# Patient Record
Sex: Female | Born: 1945 | Race: White | Hispanic: No | Marital: Married | Smoking: Never smoker
Health system: Southern US, Community
[De-identification: ages and names within clinical notes are randomized; demographics above are authoritative.]

## PROBLEM LIST (undated history)

## (undated) DIAGNOSIS — M1990 Unspecified osteoarthritis, unspecified site: Secondary | ICD-10-CM

## (undated) DIAGNOSIS — Z780 Asymptomatic menopausal state: Secondary | ICD-10-CM

## (undated) DIAGNOSIS — Z86010 Personal history of colonic polyps: Secondary | ICD-10-CM

## (undated) DIAGNOSIS — E0590 Thyrotoxicosis, unspecified without thyrotoxic crisis or storm: Secondary | ICD-10-CM

## (undated) DIAGNOSIS — K589 Irritable bowel syndrome without diarrhea: Secondary | ICD-10-CM

## (undated) DIAGNOSIS — K5732 Diverticulitis of large intestine without perforation or abscess without bleeding: Secondary | ICD-10-CM

## (undated) DIAGNOSIS — E119 Type 2 diabetes mellitus without complications: Secondary | ICD-10-CM

## (undated) DIAGNOSIS — T7840XA Allergy, unspecified, initial encounter: Secondary | ICD-10-CM

## (undated) DIAGNOSIS — E7800 Pure hypercholesterolemia, unspecified: Secondary | ICD-10-CM

## (undated) DIAGNOSIS — E039 Hypothyroidism, unspecified: Secondary | ICD-10-CM

## (undated) DIAGNOSIS — K5730 Diverticulosis of large intestine without perforation or abscess without bleeding: Secondary | ICD-10-CM

## (undated) HISTORY — PX: BREAST SURGERY: SHX581

## (undated) HISTORY — DX: Unspecified osteoarthritis, unspecified site: M19.90

## (undated) HISTORY — DX: Type 2 diabetes mellitus without complications: E11.9

## (undated) HISTORY — PX: ABDOMINAL HYSTERECTOMY: SHX81

## (undated) HISTORY — DX: Diverticulitis of large intestine without perforation or abscess without bleeding: K57.32

## (undated) HISTORY — DX: Irritable bowel syndrome, unspecified: K58.9

## (undated) HISTORY — DX: Pure hypercholesterolemia, unspecified: E78.00

## (undated) HISTORY — DX: Allergy, unspecified, initial encounter: T78.40XA

## (undated) HISTORY — PX: COLONOSCOPY: SHX174

## (undated) HISTORY — DX: Hypothyroidism, unspecified: E03.9

## (undated) HISTORY — PX: BRAIN MENINGIOMA EXCISION: SHX576

## (undated) HISTORY — DX: Personal history of colonic polyps: Z86.010

## (undated) HISTORY — DX: Thyrotoxicosis, unspecified without thyrotoxic crisis or storm: E05.90

## (undated) HISTORY — PX: TUBAL LIGATION: SHX77

## (undated) HISTORY — DX: Diverticulosis of large intestine without perforation or abscess without bleeding: K57.30

## (undated) HISTORY — DX: Asymptomatic menopausal state: Z78.0

---

## 1994-10-23 HISTORY — PX: VSD REPAIR: SHX276

## 1999-07-04 ENCOUNTER — Encounter: Payer: Self-pay | Admitting: Family Medicine

## 1999-07-04 ENCOUNTER — Ambulatory Visit (HOSPITAL_COMMUNITY): Admission: RE | Admit: 1999-07-04 | Discharge: 1999-07-04 | Payer: Self-pay | Admitting: Family Medicine

## 2001-08-01 ENCOUNTER — Ambulatory Visit (HOSPITAL_COMMUNITY): Admission: RE | Admit: 2001-08-01 | Discharge: 2001-08-01 | Payer: Self-pay | Admitting: Family Medicine

## 2001-08-01 ENCOUNTER — Encounter: Payer: Self-pay | Admitting: Family Medicine

## 2002-09-01 ENCOUNTER — Ambulatory Visit (HOSPITAL_COMMUNITY): Admission: RE | Admit: 2002-09-01 | Discharge: 2002-09-01 | Payer: Self-pay | Admitting: Family Medicine

## 2002-09-01 ENCOUNTER — Encounter: Payer: Self-pay | Admitting: Family Medicine

## 2003-09-30 ENCOUNTER — Encounter: Payer: Self-pay | Admitting: Internal Medicine

## 2004-03-30 ENCOUNTER — Emergency Department (HOSPITAL_COMMUNITY): Admission: EM | Admit: 2004-03-30 | Discharge: 2004-03-30 | Payer: Self-pay | Admitting: Family Medicine

## 2004-05-20 ENCOUNTER — Emergency Department (HOSPITAL_COMMUNITY): Admission: EM | Admit: 2004-05-20 | Discharge: 2004-05-20 | Payer: Self-pay | Admitting: Family Medicine

## 2004-08-31 ENCOUNTER — Ambulatory Visit: Payer: Self-pay | Admitting: Endocrinology

## 2005-03-15 ENCOUNTER — Ambulatory Visit (HOSPITAL_COMMUNITY): Admission: RE | Admit: 2005-03-15 | Discharge: 2005-03-15 | Payer: Self-pay | Admitting: Endocrinology

## 2005-03-31 ENCOUNTER — Ambulatory Visit: Payer: Self-pay | Admitting: Endocrinology

## 2005-04-05 ENCOUNTER — Ambulatory Visit: Payer: Self-pay | Admitting: Endocrinology

## 2006-02-28 ENCOUNTER — Ambulatory Visit: Payer: Self-pay | Admitting: Endocrinology

## 2006-02-28 ENCOUNTER — Ambulatory Visit (HOSPITAL_COMMUNITY): Admission: RE | Admit: 2006-02-28 | Discharge: 2006-02-28 | Payer: Self-pay | Admitting: Emergency Medicine

## 2006-03-28 ENCOUNTER — Ambulatory Visit: Payer: Self-pay | Admitting: Endocrinology

## 2006-04-04 ENCOUNTER — Ambulatory Visit: Payer: Self-pay | Admitting: Endocrinology

## 2006-05-03 ENCOUNTER — Ambulatory Visit: Payer: Self-pay | Admitting: Endocrinology

## 2006-05-09 ENCOUNTER — Ambulatory Visit: Payer: Self-pay | Admitting: Endocrinology

## 2006-06-01 ENCOUNTER — Emergency Department (HOSPITAL_COMMUNITY): Admission: EM | Admit: 2006-06-01 | Discharge: 2006-06-01 | Payer: Self-pay | Admitting: Family Medicine

## 2006-06-05 ENCOUNTER — Emergency Department (HOSPITAL_COMMUNITY): Admission: EM | Admit: 2006-06-05 | Discharge: 2006-06-05 | Payer: Self-pay | Admitting: Emergency Medicine

## 2006-07-18 ENCOUNTER — Ambulatory Visit: Payer: Self-pay | Admitting: Endocrinology

## 2006-11-14 ENCOUNTER — Ambulatory Visit: Payer: Self-pay | Admitting: Endocrinology

## 2007-01-09 ENCOUNTER — Ambulatory Visit: Payer: Self-pay | Admitting: Endocrinology

## 2007-01-09 LAB — CONVERTED CEMR LAB
Eosinophils Relative: 1.5 % (ref 0.0–5.0)
Lymphocytes Relative: 19.4 % (ref 12.0–46.0)
MCHC: 34.4 g/dL (ref 30.0–36.0)
Monocytes Absolute: 0.6 10*3/uL (ref 0.2–0.7)
Neutrophils Relative %: 72.1 % (ref 43.0–77.0)
Platelets: 243 10*3/uL (ref 150–400)
RBC: 4.55 M/uL (ref 3.87–5.11)
RDW: 13.1 % (ref 11.5–14.6)

## 2007-01-23 ENCOUNTER — Encounter: Admission: RE | Admit: 2007-01-23 | Discharge: 2007-01-23 | Payer: Self-pay | Admitting: Endocrinology

## 2007-02-20 ENCOUNTER — Encounter: Admission: RE | Admit: 2007-02-20 | Discharge: 2007-02-20 | Payer: Self-pay | Admitting: Endocrinology

## 2007-03-29 ENCOUNTER — Ambulatory Visit: Payer: Self-pay | Admitting: Endocrinology

## 2007-04-01 ENCOUNTER — Ambulatory Visit: Payer: Self-pay | Admitting: Endocrinology

## 2007-04-08 ENCOUNTER — Ambulatory Visit: Payer: Self-pay | Admitting: Endocrinology

## 2007-04-08 LAB — CONVERTED CEMR LAB
ALT: 31 units/L (ref 0–40)
AST: 17 units/L (ref 0–37)
Albumin: 3.2 g/dL — ABNORMAL LOW (ref 3.5–5.2)
Alkaline Phosphatase: 64 units/L (ref 39–117)
BUN: 10 mg/dL (ref 6–23)
Bacteria, UA: NEGATIVE
Basophils Relative: 1.3 % — ABNORMAL HIGH (ref 0.0–1.0)
Bilirubin, Direct: 0.1 mg/dL (ref 0.0–0.3)
Eosinophils Absolute: 0.4 10*3/uL (ref 0.0–0.6)
Eosinophils Relative: 4.1 % (ref 0.0–5.0)
GFR calc Af Amer: 131 mL/min
Glucose, Bld: 89 mg/dL (ref 70–99)
HCT: 40.5 % (ref 36.0–46.0)
Hemoglobin: 13.9 g/dL (ref 12.0–15.0)
MCHC: 34.3 g/dL (ref 30.0–36.0)
Monocytes Absolute: 0.7 10*3/uL (ref 0.2–0.7)
Monocytes Relative: 6.5 % (ref 3.0–11.0)
Mucus, UA: NEGATIVE
Neutro Abs: 7.1 10*3/uL (ref 1.4–7.7)
Platelets: 345 10*3/uL (ref 150–400)
RBC: 4.3 M/uL (ref 3.87–5.11)
Sodium: 145 meq/L (ref 135–145)
Specific Gravity, Urine: 1.01 (ref 1.000–1.03)
TSH: 2.56 microintl units/mL (ref 0.35–5.50)
Total Bilirubin: 0.6 mg/dL (ref 0.3–1.2)
Triglycerides: 118 mg/dL (ref 0–149)
VLDL: 24 mg/dL (ref 0–40)

## 2007-04-10 ENCOUNTER — Ambulatory Visit: Payer: Self-pay | Admitting: Endocrinology

## 2007-04-22 ENCOUNTER — Encounter: Payer: Self-pay | Admitting: Endocrinology

## 2007-04-22 ENCOUNTER — Ambulatory Visit: Payer: Self-pay | Admitting: Internal Medicine

## 2007-08-16 ENCOUNTER — Emergency Department (HOSPITAL_COMMUNITY): Admission: EM | Admit: 2007-08-16 | Discharge: 2007-08-16 | Payer: Self-pay | Admitting: Family Medicine

## 2007-08-29 ENCOUNTER — Emergency Department (HOSPITAL_COMMUNITY): Admission: EM | Admit: 2007-08-29 | Discharge: 2007-08-29 | Payer: Self-pay | Admitting: Emergency Medicine

## 2007-09-23 ENCOUNTER — Encounter: Payer: Self-pay | Admitting: Endocrinology

## 2007-09-25 ENCOUNTER — Telehealth (INDEPENDENT_AMBULATORY_CARE_PROVIDER_SITE_OTHER): Payer: Self-pay | Admitting: *Deleted

## 2007-09-27 ENCOUNTER — Ambulatory Visit: Payer: Self-pay | Admitting: Endocrinology

## 2007-09-27 DIAGNOSIS — E7800 Pure hypercholesterolemia, unspecified: Secondary | ICD-10-CM

## 2007-09-27 HISTORY — DX: Pure hypercholesterolemia, unspecified: E78.00

## 2007-09-27 LAB — CONVERTED CEMR LAB
BUN: 7 mg/dL
Cholesterol: 220 mg/dL
Creatinine, Ser: 0.6 mg/dL
Direct LDL: 162.6 mg/dL
HDL: 44.5 mg/dL
Total CHOL/HDL Ratio: 4.9
Triglycerides: 99 mg/dL
VLDL: 20 mg/dL

## 2007-10-22 ENCOUNTER — Ambulatory Visit (HOSPITAL_COMMUNITY): Admission: RE | Admit: 2007-10-22 | Discharge: 2007-10-22 | Payer: Self-pay | Admitting: Endocrinology

## 2007-11-06 ENCOUNTER — Encounter: Admission: RE | Admit: 2007-11-06 | Discharge: 2007-11-06 | Payer: Self-pay | Admitting: Endocrinology

## 2007-11-27 ENCOUNTER — Telehealth (INDEPENDENT_AMBULATORY_CARE_PROVIDER_SITE_OTHER): Payer: Self-pay | Admitting: *Deleted

## 2008-03-19 ENCOUNTER — Emergency Department (HOSPITAL_COMMUNITY): Admission: EM | Admit: 2008-03-19 | Discharge: 2008-03-19 | Payer: Self-pay | Admitting: Family Medicine

## 2008-03-24 LAB — CONVERTED CEMR LAB: Pap Smear: NORMAL

## 2008-04-16 ENCOUNTER — Ambulatory Visit: Payer: Self-pay | Admitting: Endocrinology

## 2008-04-19 LAB — CONVERTED CEMR LAB
Albumin: 3.9 g/dL (ref 3.5–5.2)
Alkaline Phosphatase: 59 units/L (ref 39–117)
BUN: 5 mg/dL — ABNORMAL LOW (ref 6–23)
Bilirubin Urine: NEGATIVE
Bilirubin, Direct: 0.1 mg/dL (ref 0.0–0.3)
CO2: 32 meq/L (ref 19–32)
Chloride: 105 meq/L (ref 96–112)
Cholesterol: 261 mg/dL (ref 0–200)
Creatinine, Ser: 0.7 mg/dL (ref 0.4–1.2)
Direct LDL: 192.4 mg/dL
Eosinophils Absolute: 0.2 10*3/uL (ref 0.0–0.7)
GFR calc Af Amer: 109 mL/min
GFR calc non Af Amer: 90 mL/min
HDL: 37 mg/dL — ABNORMAL LOW (ref >39.0)
Hemoglobin, Urine: NEGATIVE
Nitrite: NEGATIVE
Specific Gravity, Urine: 1.01 (ref 1.000–1.03)
TSH: 0.03 microintl units/mL — ABNORMAL LOW (ref 0.35–5.50)
Total Bilirubin: 0.8 mg/dL (ref 0.3–1.2)
Total CHOL/HDL Ratio: 7.1
Triglycerides: 142 mg/dL (ref 0–149)
VLDL: 28 mg/dL (ref 0–40)

## 2008-04-22 ENCOUNTER — Ambulatory Visit: Payer: Self-pay | Admitting: Endocrinology

## 2008-04-22 DIAGNOSIS — E039 Hypothyroidism, unspecified: Secondary | ICD-10-CM

## 2008-04-22 DIAGNOSIS — E119 Type 2 diabetes mellitus without complications: Secondary | ICD-10-CM

## 2008-04-22 HISTORY — DX: Type 2 diabetes mellitus without complications: E11.9

## 2008-04-22 HISTORY — DX: Hypothyroidism, unspecified: E03.9

## 2008-04-23 ENCOUNTER — Emergency Department (HOSPITAL_COMMUNITY): Admission: EM | Admit: 2008-04-23 | Discharge: 2008-04-23 | Payer: Self-pay | Admitting: Family Medicine

## 2008-05-06 ENCOUNTER — Telehealth: Payer: Self-pay | Admitting: Internal Medicine

## 2008-05-18 ENCOUNTER — Telehealth: Payer: Self-pay | Admitting: Internal Medicine

## 2008-05-20 DIAGNOSIS — K5730 Diverticulosis of large intestine without perforation or abscess without bleeding: Secondary | ICD-10-CM

## 2008-05-20 HISTORY — DX: Diverticulosis of large intestine without perforation or abscess without bleeding: K57.30

## 2008-05-22 ENCOUNTER — Ambulatory Visit: Payer: Self-pay | Admitting: Internal Medicine

## 2008-05-22 DIAGNOSIS — K5732 Diverticulitis of large intestine without perforation or abscess without bleeding: Secondary | ICD-10-CM

## 2008-05-22 HISTORY — DX: Diverticulitis of large intestine without perforation or abscess without bleeding: K57.32

## 2008-05-27 ENCOUNTER — Ambulatory Visit: Payer: Self-pay | Admitting: Internal Medicine

## 2008-08-26 ENCOUNTER — Encounter: Admission: RE | Admit: 2008-08-26 | Discharge: 2008-08-26 | Payer: Self-pay | Admitting: Neurosurgery

## 2009-06-07 ENCOUNTER — Ambulatory Visit: Payer: Self-pay | Admitting: Endocrinology

## 2009-06-09 LAB — CONVERTED CEMR LAB
ALT: 18 units/L (ref 0–35)
AST: 19 units/L (ref 0–37)
Albumin: 4.1 g/dL (ref 3.5–5.2)
BUN: 10 mg/dL (ref 6–23)
Basophils Absolute: 0.3 10*3/uL — ABNORMAL HIGH (ref 0.0–0.1)
Bilirubin, Direct: 0.1 mg/dL (ref 0.0–0.3)
Cholesterol: 309 mg/dL — ABNORMAL HIGH (ref 0–200)
Eosinophils Relative: 2.4 % (ref 0.0–5.0)
Glucose, Bld: 96 mg/dL (ref 70–99)
HCT: 43.3 % (ref 36.0–46.0)
Hemoglobin: 14.8 g/dL (ref 12.0–15.0)
Lymphocytes Relative: 32.5 % (ref 12.0–46.0)
MCHC: 34 g/dL (ref 30.0–36.0)
MCV: 91.8 fL (ref 78.0–100.0)
Microalb Creat Ratio: 4.1 mg/g (ref 0.0–30.0)
Microalb, Ur: 0.6 mg/dL (ref 0.0–1.9)
Monocytes Absolute: 0.4 10*3/uL (ref 0.1–1.0)
Neutro Abs: 2.6 10*3/uL (ref 1.4–7.7)
Platelets: 223 10*3/uL (ref 150.0–400.0)
Potassium: 4 meq/L (ref 3.5–5.1)
RBC: 4.72 M/uL (ref 3.87–5.11)
Sodium: 144 meq/L (ref 135–145)
TSH: 0.24 microintl units/mL — ABNORMAL LOW (ref 0.35–5.50)
VLDL: 23.4 mg/dL (ref 0.0–40.0)
WBC: 5.1 10*3/uL (ref 4.5–10.5)

## 2009-08-14 ENCOUNTER — Encounter: Admission: RE | Admit: 2009-08-14 | Discharge: 2009-08-14 | Payer: Self-pay | Admitting: Neurosurgery

## 2009-11-18 ENCOUNTER — Ambulatory Visit: Payer: Self-pay | Admitting: Endocrinology

## 2010-02-28 ENCOUNTER — Ambulatory Visit: Payer: Self-pay | Admitting: Endocrinology

## 2010-02-28 LAB — CONVERTED CEMR LAB
AST: 19 units/L (ref 0–37)
Albumin: 3.9 g/dL (ref 3.5–5.2)
BUN: 9 mg/dL (ref 6–23)
Basophils Absolute: 0 10*3/uL (ref 0.0–0.1)
Bilirubin Urine: NEGATIVE
Calcium: 9.1 mg/dL (ref 8.4–10.5)
Chloride: 106 meq/L (ref 96–112)
Cholesterol: 308 mg/dL — ABNORMAL HIGH (ref 0–200)
Creatinine, Ser: 0.7 mg/dL (ref 0.4–1.2)
Eosinophils Absolute: 0.2 10*3/uL (ref 0.0–0.7)
Eosinophils Relative: 3.1 % (ref 0.0–5.0)
GFR calc non Af Amer: 89.77 mL/min (ref >60)
Glucose, Bld: 91 mg/dL (ref 70–99)
HDL: 53 mg/dL (ref >39.00)
Hemoglobin, Urine: NEGATIVE
Hemoglobin: 14.4 g/dL (ref 12.0–15.0)
Ketones, ur: NEGATIVE mg/dL
Lymphs Abs: 1.8 10*3/uL (ref 0.7–4.0)
Monocytes Absolute: 0.4 10*3/uL (ref 0.1–1.0)
Monocytes Relative: 7.1 % (ref 3.0–12.0)
Neutro Abs: 2.6 10*3/uL (ref 1.4–7.7)
Nitrite: NEGATIVE
Sodium: 143 meq/L (ref 135–145)
Specific Gravity, Urine: <=1.005 (ref 1.000–1.030)
Total Protein, Urine: NEGATIVE mg/dL
Triglycerides: 114 mg/dL (ref 0.0–149.0)
Urobilinogen, UA: 0.2 (ref 0.0–1.0)

## 2010-03-03 ENCOUNTER — Ambulatory Visit: Payer: Self-pay | Admitting: Endocrinology

## 2010-03-03 DIAGNOSIS — Z780 Asymptomatic menopausal state: Secondary | ICD-10-CM

## 2010-03-03 HISTORY — DX: Asymptomatic menopausal state: Z78.0

## 2010-03-09 ENCOUNTER — Encounter: Payer: Self-pay | Admitting: Endocrinology

## 2010-03-09 ENCOUNTER — Ambulatory Visit: Payer: Self-pay | Admitting: Internal Medicine

## 2010-04-28 ENCOUNTER — Encounter: Admission: RE | Admit: 2010-04-28 | Discharge: 2010-04-28 | Payer: Self-pay | Admitting: Endocrinology

## 2010-04-28 LAB — HM MAMMOGRAPHY: HM Mammogram: NEGATIVE

## 2010-08-15 ENCOUNTER — Ambulatory Visit: Payer: Self-pay | Admitting: Endocrinology

## 2010-08-15 DIAGNOSIS — N3000 Acute cystitis without hematuria: Secondary | ICD-10-CM | POA: Insufficient documentation

## 2010-08-15 LAB — CONVERTED CEMR LAB
Bilirubin Urine: NEGATIVE
Blood in Urine, dipstick: NEGATIVE
Specific Gravity, Urine: 1.015
pH: 6

## 2010-08-16 ENCOUNTER — Encounter: Payer: Self-pay | Admitting: Endocrinology

## 2010-08-18 ENCOUNTER — Telehealth: Payer: Self-pay | Admitting: Endocrinology

## 2010-11-13 ENCOUNTER — Encounter: Payer: Self-pay | Admitting: Neurosurgery

## 2010-11-13 ENCOUNTER — Encounter: Payer: Self-pay | Admitting: Endocrinology

## 2010-11-22 NOTE — Assessment & Plan Note (Signed)
Summary: LOWER ABD PAIN  STC   Vital Signs:  Patient profile:   65 year old female Height:      62 inches (157.48 cm) Weight:      136.25 pounds (61.93 kg) BMI:     25.01 O2 Sat:      97 % on Room air Temp:     98.3 degrees F (36.83 degrees C) oral Pulse rate:   79 / minute BP sitting:   112 / 80  (left arm) Cuff size:   regular  Vitals Entered By: Brenton Grills MA (August 15, 2010 3:21 PM)  O2 Flow:  Room air CC: Lower Abdominal/Back pain/aj Is Patient Diabetic? Yes   Primary Provider:  Romero Belling, M.D.  CC:  Lower Abdominal/Back pain/aj.  History of Present Illness: pt states few weeks of pain at the suprapubic area, and at the lower back.  no dysuria.  Current Medications (verified): 1)  Tylenol Extra Strength 500 Mg  Tabs (Acetaminophen) .Marland Kitchen.. 1 Tablet By Mouth Two Times A Day 2)  Levothyroxine Sodium 75 Mcg Tabs (Levothyroxine Sodium) .Marland Kitchen.. 1 Once Daily 3)  Screening Mammography  Allergies (verified): 1)  ! Zocor (Simvastatin) 2)  ! Lipitor (Atorvastatin) 3)  Sulfamethoxazole (Sulfamethoxazole) 4)  Evista (Raloxifene Hcl)  Past History:  Past Medical History: Last updated: 05/22/2008 Diabetes Mwllitus borderline  Diverticulosis Glaucoma Hyperlipidemia Hyperthyroidism Irritable Bowel Syndrome Urinary Tract Infection  Review of Systems  The patient denies fever and hematochezia.    Physical Exam  General:  normal appearance.   Abdomen:  there is slight tenderness over the lower abdomen   Impression & Recommendations:  Problem # 1:  ACUTE CYSTITIS (ICD-595.0) ? cause of the pain  Medications Added to Medication List This Visit: 1)  Ciprofloxacin Hcl 500 Mg Tabs (Ciprofloxacin hcl) .Marland Kitchen.. 1 tab two times a day  Other Orders: T-Urine Culture (Spectrum Order) [id number]) New Patient Level II (09811) Est. Patient Level II (91478)  Patient Instructions: 1)  trial of cipro 500 mg two times a day 2)  call next week if not better, to consider  ultrasound of the ovaries. Prescriptions: CIPROFLOXACIN HCL 500 MG TABS (CIPROFLOXACIN HCL) 1 tab two times a day  #14 x 0   Entered and Authorized by:   Minus Breeding MD   Signed by:   Minus Breeding MD on 08/15/2010   Method used:   [facility]  [phone][facility] (retail)       [address]       [place], [place]  [address]       Ph: [phone] or [phone]       Fax: [phone]   RxID:   [id number]    Orders Added: 1)  T-Urine Culture (Spectrum Order) [[id number]] 2)  New Patient Level II [99202] 3)  Est. Patient Level II [16606]    Laboratory Results   Urine Tests   Date/Time Reported: 08/15/10 3:33pm  Routine Urinalysis   Color: lt. yellow Appearance: Clear Glucose: negative   (Normal Range: Negative) Bilirubin: negative   (Normal Range: Negative) Ketone: negative   (Normal Range: Negative) Spec. Gravity: 1.015   (Normal Range: 1.003-1.035) Blood: negative   (Normal Range: Negative) pH: 6.0   (Normal Range: 5.0-8.0) Protein: negative   (Normal Range: Negative) Urobilinogen: 0.2   (Normal Range: 0-1) Nitrite: negative   (Normal Range: Negative) Leukocyte Esterace: small   (Normal Range: Negative)

## 2010-11-22 NOTE — Progress Notes (Signed)
Summary: UA results  Phone Note Call from Patient Call back at Home Phone [phone]   Caller: Patient Summary of Call: Pt called requesting results of UA Initial call taken by: Margaret Pyle, CMA,  August 18, 2010 3:07 PM  Follow-up for Phone Call        mild uti.  i hope the antibiotic helps sxs.  please call if not. Follow-up by: Minus Breeding MD,  August 18, 2010 3:11 PM  Additional Follow-up for Phone Call Additional follow up Details #1::        left message on machine for pt to return my call.Margaret Pyle, CMA  August 18, 2010 3:23 PM   Pt's spouse informed Additional Follow-up by: Margaret Pyle, CMA,  August 19, 2010 8:16 AM

## 2010-11-22 NOTE — Miscellaneous (Signed)
Summary: BONE DENSITY  Clinical Lists Changes  Orders: Added new Test order of T-Lumbar Vertebral Assessment (77082) - Signed 

## 2010-11-22 NOTE — Letter (Signed)
Summary: Claim form/[employer]  Claim form/[employer]   Imported By: Sherian Rein 11/22/2009 15:16:45  _____________________________________________________________________  External Attachment:    Type:   Image     Comment:   External Document

## 2010-11-22 NOTE — Assessment & Plan Note (Signed)
Summary: cpx/#/bcbs/cd   Vital Signs:  Patient profile:   65 year old female Height:      62 inches (157.48 cm) Weight:      124.13 pounds (56.42 kg) O2 Sat:      97 % on Room air Temp:     97.8 degrees F (36.56 degrees C) oral Pulse rate:   63 / minute BP sitting:   120 / 78  (left arm) Cuff size:   regular  Vitals Entered By: Josph Macho RMA (Mar 03, 2010 3:46 PM)  O2 Flow:  Room air CC: Physical/ CF Is Patient Diabetic? Yes   Primary Provider:  Romero Belling, M.D.  CC:  Physical/ CF.  History of Present Illness: here for regular wellness examination.  she's feeling pretty well in general, and does not drink or smoke.   Current Medications (verified): 1)  Tylenol Extra Strength 500 Mg  Tabs (Acetaminophen) .Marland Kitchen.. 1 Tablet By Mouth Two Times A Day 2)  Levothyroxine Sodium 50 Mcg Tabs (Levothyroxine Sodium) .Marland Kitchen.. 1 Qd  Allergies (verified): 1)  ! Zocor (Simvastatin) 2)  ! 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  Social History: Reviewed history from 06/07/2009 and no changes required. retired married Patient has never smoked.  Alcohol Use - no Daily Caffeine Use  Review of Systems  The patient denies fever, weight loss, weight gain, vision loss, decreased hearing, chest pain, syncope, dyspnea on exertion, prolonged cough, headaches, abdominal pain, melena, hematochezia, severe  indigestion/heartburn, hematuria, suspicious skin lesions, and depression.    Physical Exam  General:  normal appearance.   Head:  head: no deformity eyes: no periorbital swelling, no proptosis external nose and ears are normal mouth: no lesion seen Neck:  Supple without thyroid enlargement or tenderness.  Breasts:  No tenderness, masses, nipple discharge, or skin abnormalities.  Lungs:  Clear to auscultation bilaterally. Normal respiratory effort.  Heart:  Regular rate and rhythm without murmurs or gallops noted. Normal S1,S2.   Abdomen:  abdomen is soft, nontender.  no hepatosplenomegaly.   not distended.  no hernia  Rectal:  normal external and internal exam.  heme neg  Genitalia:  Pelvic Exam:  External: normal female genitalia without lesions or masses        Vagina: normal without lesions or masses               Adnexa: normal bimanual exam without masses or fullness        Msk:  muscle bulk and strength are grossly normal.  no obvious joint swelling.  gait is normal and steady  Pulses:  dorsalis pedis intact bilat.  no carotid bruit  Extremities:  no deformity.  no ulcer on the feet.  feet are of normal color and temp.  no edema  Neurologic:  cn 2-12 grossly intact.   readily moves all 4's.   sensation is intact to touch on the feet  Skin:  normal texture and temp.  no rash.  not diaphoretic  Cervical Nodes:  No significant adenopathy.  Psych:  Alert and cooperative; normal  mood and affect; normal attention span and concentration.     Impression & Recommendations:  Problem # 1:  ROUTINE GENERAL MEDICAL EXAM@HEALTH  CARE FACL (ICD-V70.0)  Medications Added to Medication List This Visit: 1)  Levothyroxine Sodium 75 Mcg Tabs (Levothyroxine sodium) .Marland Kitchen.. 1 once daily 2)  Screening Mammography   Other Orders: T-Bone Densitometry (16109) Est. Patient 40-64 years (60454)   Patient Instructions: 1)  please let me know if you decide to take the powder for cholesterol.   high cholesterol is risky for your health. 2)  increase levothyroxine to 75 micrograms/day.please consider these measures for your health:  minimize alcohol.  do not use tobacco products.  have a colonoscopy at least every 10 years from age 41.  keep firearms safely stored.  always use seat belts.  have working smoke alarms in your home.  see the dentist regularly.  never drive under the influence of alcohol or drugs (including prescription drugs).  those with fair skin should take precautions against the sun. 3)  please let me know what your wishes would be, if artificial life support measures should become necessary.  it is critically important to prevent falling down (keep floor areas well-lit, dry, and free of loose objects). 4)  here is a request for a bone-density test, and a prescription for a mammogram. 5)  (we discussed code status.  pt requests full code, but would not want to be started or maintained on artificial life-support measures if there was not a reasonable chance of recovery) Prescriptions: SCREENING MAMMOGRAPHY   #0 x 0   Entered and Authorized by:   Minus Breeding MD   Signed by:   Minus Breeding MD on 03/03/2010   Method used:   Print then Give to Patient   RxID:   [id number] LEVOTHYROXINE SODIUM 75 MCG TABS (LEVOTHYROXINE SODIUM) 1 once daily  #30 x 11   Entered and Authorized by:   Minus Breeding MD   Signed by:   Minus Breeding MD on 03/03/2010   Method used:   [facility]        [facility]  [address] [phone]* (retail)       [address]       [place], [place]  [address]       Ph: [phone] or [phone]       Fax: [phone]   RxID:   [id number]

## 2011-02-07 ENCOUNTER — Inpatient Hospital Stay (INDEPENDENT_AMBULATORY_CARE_PROVIDER_SITE_OTHER)
Admission: RE | Admit: 2011-02-07 | Discharge: 2011-02-07 | Disposition: A | Discharge status: Home / Self Care | Payer: Self-pay | Source: Ambulatory Visit | Attending: Family Medicine | Admitting: Family Medicine

## 2011-02-07 DIAGNOSIS — N390 Urinary tract infection, site not specified: Secondary | ICD-10-CM

## 2011-02-07 LAB — POCT URINALYSIS DIP (DEVICE)
Glucose, UA: NEGATIVE mg/dL
Hgb urine dipstick: NEGATIVE
Ketones, ur: NEGATIVE mg/dL
Protein, ur: NEGATIVE mg/dL
Specific Gravity, Urine: <=1.005 (ref 1.005–1.030)

## 2011-03-10 ENCOUNTER — Other Ambulatory Visit (INDEPENDENT_AMBULATORY_CARE_PROVIDER_SITE_OTHER): Payer: Self-pay

## 2011-03-10 ENCOUNTER — Encounter: Payer: Self-pay | Admitting: Endocrinology

## 2011-03-10 ENCOUNTER — Ambulatory Visit (INDEPENDENT_AMBULATORY_CARE_PROVIDER_SITE_OTHER): Payer: Self-pay | Admitting: Endocrinology

## 2011-03-10 DIAGNOSIS — E039 Hypothyroidism, unspecified: Secondary | ICD-10-CM

## 2011-03-10 DIAGNOSIS — E119 Type 2 diabetes mellitus without complications: Secondary | ICD-10-CM

## 2011-03-10 LAB — BASIC METABOLIC PANEL
CO2: 31 mEq/L (ref 19–32)
Calcium: 9.4 mg/dL (ref 8.4–10.5)
Creatinine, Ser: 0.6 mg/dL (ref 0.4–1.2)

## 2011-03-10 LAB — HEMOGLOBIN A1C: Hgb A1c MFr Bld: 6 % (ref 4.6–6.5)

## 2011-03-10 LAB — TSH: TSH: 0.42 u[IU]/mL (ref 0.35–5.50)

## 2011-03-10 NOTE — Progress Notes (Signed)
  Subjective:    Patient ID: Mia Newman, female    DOB: 1945-11-30, 65 y.o.   MRN: [id number]  HPI The state of at least three ongoing medical problems is addressed today: Dm: pt reports weight gain Hypothyroidism:  She has mild depression Dyslipidemia: pt reports constip,  and abdominal bloating.    Review of Systems She has slight dry skin, and insomnia.      Objective:   Physical Exam GENERAL: no distress Neck - No masses or thyromegaly.    Lab Results  Component Value Date   WBC 4.9 02/28/2010   HGB 14.4 02/28/2010   HCT 41.9 02/28/2010   PLT 239.0 02/28/2010   CHOL 308* 02/28/2010   TRIG 114.0 02/28/2010   HDL 53.00 02/28/2010   LDLDIRECT 244.9 02/28/2010   ALT 17 02/28/2010   AST 19 02/28/2010   NA 142 03/10/2011   K 4.1 03/10/2011   CL 106 03/10/2011   CREATININE 0.6 03/10/2011   BUN 9 03/10/2011   CO2 31 03/10/2011   TSH 0.42 03/10/2011   HGBA1C 6.0 03/10/2011   MICROALBUR 0.6 06/07/2009     Assessment & Plan:  Dyslipidemia, needs increased rx Dm, well-controlled Hypothyroidism.  Well-replaced

## 2011-03-10 NOTE — Consult Note (Signed)
[facility]                          ENDOCRINOLOGY CONSULTATION   NAME:Stalder, LORENZA SHAKIR                   MRN:          [id number]  DATE:01/09/2007                            DOB:          1946/03/18    REASON FOR VISIT:  Followup abnormality ophthalmologic examination.   HISTORY OF PRESENT ILLNESS:  A 65 year old woman who was recently noted  by Dr. Emily Filbert to have an abnormal ophthalmologic examination.   She also had several weeks of pelvic pain, worse on the left. She feels  she has diverticulitis.   PAST MEDICAL HISTORY:  1. IBS.  2. Type 2 diabetes.  3. Osteoporosis.  4. Chronic sinusitis.  5. Recurrent diverticulitis.  6. Dyslipidemia.  7. Migraine.  8. Glaucoma.  9. Osteoarthritis.   REVIEW OF SYSTEMS:  Denies fever.   PHYSICAL EXAMINATION:  VITAL SIGNS:  Blood pressure 147/85, heart rate  is 82, temperature is 99.2, the weight is 137.  GENERAL:  No distress.  ABDOMEN:  Soft. There is slight tenderness at the left lower quadrant.   IMPRESSION:  1. Recent abnormal ophthalmologic examination.  2. Abdominal and/or pelvic pain.   PLAN:  1. MRI of the pituitary.  2. I have advised her to have a pelvic ultrasound to exclude ovarian      cancer, she has refused and I have told her of the risk of      unnecessary death by this refusal, she states she still      wishes to refuse.  3. Flagyl and Cipro, each 500 mg twice a day for a week.     Sean A. Everardo All, MD  Electronically Signed    SAE/MedQ  DD: 01/11/2007  DT: 01/11/2007  Job #: [id number]   cc:   Vincenza Hews, M.D.

## 2011-03-10 NOTE — Patient Instructions (Addendum)
blood tests are being ordered for you today.  please call [phone] to hear your test results.  You will be prompted to enter the 9-digit "MRN" number that appears at the top left of this page, followed by #.  Then you will hear the message. Please return here next year, when you get medicare.   (update: i left message on phone-tree:  rx as we discussed)

## 2011-03-30 ENCOUNTER — Other Ambulatory Visit: Payer: Self-pay | Admitting: Endocrinology

## 2011-07-06 ENCOUNTER — Other Ambulatory Visit: Payer: Self-pay | Admitting: *Deleted

## 2011-07-06 DIAGNOSIS — D329 Benign neoplasm of meninges, unspecified: Secondary | ICD-10-CM

## 2011-08-01 LAB — URINE CULTURE

## 2011-08-01 LAB — POCT URINALYSIS DIP (DEVICE)
Glucose, UA: NEGATIVE
Hgb urine dipstick: NEGATIVE
Nitrite: NEGATIVE
Urobilinogen, UA: 0.2

## 2011-08-01 LAB — DIFFERENTIAL
Basophils Relative: 1
Eosinophils Absolute: 0.2
Eosinophils Relative: 3
Neutrophils Relative %: 49

## 2011-08-01 LAB — CBC
HCT: 42.6
MCHC: 33.2
MCV: 88.9
Platelets: 373

## 2011-08-02 LAB — POCT URINALYSIS DIP (DEVICE)
Bilirubin Urine: NEGATIVE
Ketones, ur: NEGATIVE
Operator id: 247071
Protein, ur: NEGATIVE
Specific Gravity, Urine: <=1.005
pH: 5.5

## 2011-08-10 ENCOUNTER — Ambulatory Visit
Admission: RE | Admit: 2011-08-10 | Discharge: 2011-08-10 | Disposition: A | Discharge status: Home / Self Care | Payer: No Typology Code available for payment source | Source: Ambulatory Visit | Attending: *Deleted | Admitting: *Deleted

## 2011-08-10 DIAGNOSIS — D329 Benign neoplasm of meninges, unspecified: Secondary | ICD-10-CM

## 2011-08-10 MED ORDER — GADOBENATE DIMEGLUMINE 529 MG/ML IV SOLN
12.0000 mL | Freq: Once | INTRAVENOUS | Status: AC | PRN
  Administered 2011-08-10: 12 mL via INTRAVENOUS

## 2012-01-04 ENCOUNTER — Encounter: Payer: Self-pay | Admitting: Endocrinology

## 2012-01-04 ENCOUNTER — Ambulatory Visit (INDEPENDENT_AMBULATORY_CARE_PROVIDER_SITE_OTHER): Payer: Medicare Other | Admitting: Endocrinology

## 2012-01-04 ENCOUNTER — Other Ambulatory Visit (INDEPENDENT_AMBULATORY_CARE_PROVIDER_SITE_OTHER): Payer: Medicare Other

## 2012-01-04 VITALS — BP 122/82 | HR 73 | Temp 98.1°F | Ht 61.0 in | Wt 137.0 lb

## 2012-01-04 DIAGNOSIS — E7800 Pure hypercholesterolemia, unspecified: Secondary | ICD-10-CM

## 2012-01-04 DIAGNOSIS — N3000 Acute cystitis without hematuria: Secondary | ICD-10-CM

## 2012-01-04 DIAGNOSIS — Z79899 Other long term (current) drug therapy: Secondary | ICD-10-CM

## 2012-01-04 DIAGNOSIS — E039 Hypothyroidism, unspecified: Secondary | ICD-10-CM

## 2012-01-04 DIAGNOSIS — E119 Type 2 diabetes mellitus without complications: Secondary | ICD-10-CM

## 2012-01-04 DIAGNOSIS — N390 Urinary tract infection, site not specified: Secondary | ICD-10-CM | POA: Diagnosis not present

## 2012-01-04 DIAGNOSIS — Z0000 Encounter for general adult medical examination without abnormal findings: Secondary | ICD-10-CM | POA: Insufficient documentation

## 2012-01-04 LAB — BASIC METABOLIC PANEL
BUN: 7 mg/dL (ref 6–23)
Calcium: 9.5 mg/dL (ref 8.4–10.5)
Chloride: 107 mEq/L (ref 96–112)
Creatinine, Ser: 0.8 mg/dL (ref 0.4–1.2)
GFR: 82.42 mL/min (ref >60.00)

## 2012-01-04 LAB — POCT URINALYSIS DIPSTICK
Bilirubin, UA: NEGATIVE
Glucose, UA: NEGATIVE
Ketones, UA: NEGATIVE
Nitrite, UA: NEGATIVE
pH, UA: 5

## 2012-01-04 LAB — CBC WITH DIFFERENTIAL/PLATELET
Basophils Absolute: 0 10*3/uL (ref 0.0–0.1)
HCT: 45.8 % (ref 36.0–46.0)
Hemoglobin: 15.3 g/dL — ABNORMAL HIGH (ref 12.0–15.0)
Lymphs Abs: 1.3 10*3/uL (ref 0.7–4.0)
MCHC: 33.4 g/dL (ref 30.0–36.0)
MCV: 91.9 fl (ref 78.0–100.0)
Monocytes Absolute: 0.9 10*3/uL (ref 0.1–1.0)
Monocytes Relative: 8.3 % (ref 3.0–12.0)
Neutro Abs: 8.4 10*3/uL — ABNORMAL HIGH (ref 1.4–7.7)
Platelets: 255 10*3/uL (ref 150.0–400.0)
RDW: 14.3 % (ref 11.5–14.6)

## 2012-01-04 LAB — HEPATIC FUNCTION PANEL
AST: 18 U/L (ref 0–37)
Albumin: 4.3 g/dL (ref 3.5–5.2)
Total Bilirubin: 0.5 mg/dL (ref 0.3–1.2)

## 2012-01-04 LAB — TSH: TSH: 0.21 u[IU]/mL — ABNORMAL LOW (ref 0.35–5.50)

## 2012-01-04 LAB — LIPID PANEL
Cholesterol: 296 mg/dL — ABNORMAL HIGH (ref 0–200)
Total CHOL/HDL Ratio: 6
Triglycerides: 108 mg/dL (ref 0.0–149.0)

## 2012-01-04 LAB — HEMOGLOBIN A1C: Hgb A1c MFr Bld: 6 % (ref 4.6–6.5)

## 2012-01-04 LAB — LDL CHOLESTEROL, DIRECT: Direct LDL: 220.2 mg/dL

## 2012-01-04 MED ORDER — LEVOTHYROXINE SODIUM 50 MCG PO TABS: 50.0000 ug | ORAL_TABLET | Freq: Every day | ORAL | Status: DC

## 2012-01-04 MED ORDER — CIPROFLOXACIN HCL 500 MG PO TABS: 500.0000 mg | ORAL_TABLET | Freq: Two times a day (BID) | ORAL | Status: AC

## 2012-01-04 NOTE — Patient Instructions (Addendum)
i have sent a prescription to your pharmacy, fr an antibiotic I hope you feel better soon.  If you don't feel better by next week, please call back blood tests are being requested for you today.  You will receive a letter with results. Please come back for a "medicare wellness" appointment soon.   (see letter)

## 2012-01-04 NOTE — Progress Notes (Signed)
Subjective:    Patient ID: Mia Newman, female    DOB: 1946-03-13, 66 y.o.   MRN: [id number]  HPI Pt states few days of slight crampy-quality pain at the suprapubic area, but no assoc hematuria.   Past Medical History  Diagnosis Date  . Unspecified hypothyroidism 04/22/2008  . DM 04/22/2008    borderline  . Pure hypercholesterolemia 09/27/2007  . DIVERTICULITIS-COLON 05/22/2008  . DIVERTICULOSIS, COLON 05/20/2008  . ASYMPTOMATIC POSTMENOPAUSAL STATUS 03/03/2010  . Glaucoma   . Hyperthyroidism   . Irritable bowel syndrome     Past Surgical History  Procedure Date  . Vsd repair 1996  . Abdominal hysterectomy 1987 apx  . Tubal ligation   . 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 Marital Status: Married    Spouse Name: N/A    Number of Children: N/A  . Years of Education: N/A   Occupational History  . Retired    Social History Main Topics  . Smoking status: Never Smoker   . Smokeless tobacco: Not on file  . Alcohol Use: No  . Drug Use:   . Sexually Active:    Other Topics Concern  . Not on file   Social History Narrative  . No narrative on file    Current Outpatient Prescriptions on File Prior to Visit  Medication Sig Dispense Refill  . acetaminophen (TYLENOL) 500 MG tablet Take 500 mg by mouth 2 (two) times daily.        . brimonidine (ALPHAGAN) 0.2 % ophthalmic solution Place 1 drop into both eyes 2 (two) times daily.        Marland Kitchen latanoprost (XALATAN) 0.005 % ophthalmic solution Place 1 drop into both eyes at bedtime.          Allergies  Allergen Reactions  . Atorvastatin     REACTION: myalgias  . Raloxifene     REACTION: unspecified  . Simvastatin     REACTION: myalgias (pt perceives due to zocor)  . Sulfamethoxazole     REACTION: unspecified    Family History  Problem Relation Age of Onset  . Heart attack Father 98  . Cancer Maternal Grandmother     Uterine Cancer  . Cancer Paternal  Grandmother     Stomach Cancer    BP 122/82  Pulse 73  Temp(Src) 98.1 F (36.7 C) (Oral)  Ht 5\' 1"  (1.549 m)  Wt 137 lb (62.143 kg)  BMI 25.89 kg/m2  SpO2 98%    Review of Systems Denies fever, but she has urinary frequency.    Objective:   Physical Exam VITAL SIGNS:  See vs page GENERAL: no distress Abd: there is suprapubic tenderness.     (i reviewed ua results) Lab Results  Component Value Date   WBC 10.7* 01/04/2012   HGB 15.3* 01/04/2012   HCT 45.8 01/04/2012   PLT 255.0 01/04/2012   GLUCOSE 100* 01/04/2012   CHOL 296* 01/04/2012   TRIG 108.0 01/04/2012   HDL 53.80 01/04/2012   LDLDIRECT 220.2 01/04/2012   LDLCALC 80 04/08/2007   ALT 21 01/04/2012   AST 18 01/04/2012   NA 144 01/04/2012   K 4.1 01/04/2012   CL 107 01/04/2012   CREATININE 0.8 01/04/2012   BUN 7 01/04/2012   CO2 27 01/04/2012   TSH 0.21* 01/04/2012   HGBA1C 6.0 01/04/2012   MICROALBUR 0.6 06/07/2009      Assessment & Plan:  UTI, new  Dyslipidemia, therapy is limited by multiple perceived drug intolerances Hypothyroidism, slightly overreplaced

## 2012-01-06 LAB — URINE CULTURE

## 2012-01-30 DIAGNOSIS — H401290 Low-tension glaucoma, unspecified eye, stage unspecified: Secondary | ICD-10-CM | POA: Diagnosis not present

## 2012-02-16 ENCOUNTER — Telehealth: Payer: Self-pay

## 2012-02-16 ENCOUNTER — Other Ambulatory Visit (INDEPENDENT_AMBULATORY_CARE_PROVIDER_SITE_OTHER): Payer: Medicare Other

## 2012-02-16 DIAGNOSIS — E039 Hypothyroidism, unspecified: Secondary | ICD-10-CM

## 2012-02-16 LAB — TSH: TSH: 5.11 u[IU]/mL (ref 0.35–5.50)

## 2012-02-16 NOTE — Telephone Encounter (Signed)
Pt informed to come in for lab work

## 2012-02-16 NOTE — Telephone Encounter (Signed)
Please come in for blood test.  i have ordered

## 2012-02-16 NOTE — Telephone Encounter (Signed)
Pt called stating that since her Levothyroxine has been lowered to 50 mcg she has not felt well. Pt c/o of fatigue and general malaise. Pt is requesting to have Rx increased to 75 mcg and refilled to [facility] on [address], please advise.

## 2012-02-17 ENCOUNTER — Encounter: Payer: Self-pay | Admitting: Endocrinology

## 2012-02-19 ENCOUNTER — Telehealth: Payer: Self-pay | Admitting: *Deleted

## 2012-02-19 MED ORDER — LEVOTHYROXINE SODIUM 50 MCG PO TABS: 50.0000 ug | ORAL_TABLET | Freq: Every day | ORAL | Status: DC

## 2012-02-19 NOTE — Telephone Encounter (Signed)
Called pt to inform of lab results, pt informed (letter also mailed to pt), rx sent to [facility] as pt requested.

## 2012-03-27 DIAGNOSIS — D235 Other benign neoplasm of skin of trunk: Secondary | ICD-10-CM | POA: Diagnosis not present

## 2012-04-30 DIAGNOSIS — H401290 Low-tension glaucoma, unspecified eye, stage unspecified: Secondary | ICD-10-CM | POA: Diagnosis not present

## 2012-05-24 DIAGNOSIS — B079 Viral wart, unspecified: Secondary | ICD-10-CM | POA: Diagnosis not present

## 2012-06-21 ENCOUNTER — Other Ambulatory Visit: Payer: Self-pay | Admitting: Endocrinology

## 2012-06-21 DIAGNOSIS — Z1231 Encounter for screening mammogram for malignant neoplasm of breast: Secondary | ICD-10-CM

## 2012-07-05 ENCOUNTER — Ambulatory Visit
Admission: RE | Admit: 2012-07-05 | Discharge: 2012-07-05 | Disposition: A | Discharge status: Home / Self Care | Payer: Medicare Other | Source: Ambulatory Visit | Attending: Endocrinology | Admitting: Endocrinology

## 2012-07-05 DIAGNOSIS — Z1231 Encounter for screening mammogram for malignant neoplasm of breast: Secondary | ICD-10-CM | POA: Diagnosis not present

## 2012-07-30 DIAGNOSIS — H47219 Primary optic atrophy, unspecified eye: Secondary | ICD-10-CM | POA: Diagnosis not present

## 2012-07-30 DIAGNOSIS — H401290 Low-tension glaucoma, unspecified eye, stage unspecified: Secondary | ICD-10-CM | POA: Diagnosis not present

## 2012-10-29 DIAGNOSIS — H4011X0 Primary open-angle glaucoma, stage unspecified: Secondary | ICD-10-CM | POA: Diagnosis not present

## 2012-11-18 ENCOUNTER — Ambulatory Visit (INDEPENDENT_AMBULATORY_CARE_PROVIDER_SITE_OTHER): Payer: Medicare Other | Admitting: Endocrinology

## 2012-11-18 VITALS — BP 122/74 | HR 82 | Temp 98.8°F | Wt 142.0 lb

## 2012-11-18 DIAGNOSIS — E039 Hypothyroidism, unspecified: Secondary | ICD-10-CM | POA: Diagnosis not present

## 2012-11-18 DIAGNOSIS — E890 Postprocedural hypothyroidism: Secondary | ICD-10-CM | POA: Diagnosis not present

## 2012-11-18 MED ORDER — METHOCARBAMOL 500 MG PO TABS: 500.0000 mg | ORAL_TABLET | Freq: Three times a day (TID) | ORAL | Status: DC

## 2012-11-18 NOTE — Patient Instructions (Addendum)
blood tests are being requested for you today.  We'll contact you with results.  Please come back for a "medicare wellness" appointment after 01/03/13.  i have sent a prescription to your pharmacy, to take as needed for the symptoms.

## 2012-11-18 NOTE — Progress Notes (Signed)
  Subjective:    Patient ID: Mia Newman, female    DOB: 1946-05-12, 67 y.o.   MRN: [id number]  HPI Pt states few mos of intermittent cramps at the left side of the neck, and assoc pain.   Past Medical History  Diagnosis Date  . Unspecified hypothyroidism 04/22/2008  . DM 04/22/2008    borderline  . Pure hypercholesterolemia 09/27/2007  . DIVERTICULITIS-COLON 05/22/2008  . DIVERTICULOSIS, COLON 05/20/2008  . ASYMPTOMATIC POSTMENOPAUSAL STATUS 03/03/2010  . Glaucoma   . Hyperthyroidism   . Irritable bowel syndrome     Past Surgical History  Procedure Date  . Vsd repair 1996  . Abdominal hysterectomy 1987 apx  . Tubal ligation   . 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 Marital Status: Married    Spouse Name: N/A    Number of Children: N/A  . Years of Education: N/A   Occupational History  . Retired    Social History Main Topics  . Smoking status: Never Smoker   . Smokeless tobacco: Not on file  . Alcohol Use: No  . Drug Use:   . Sexually Active:    Other Topics Concern  . Not on file   Social History Narrative  . No narrative on file    Current Outpatient Prescriptions on File Prior to Visit  Medication Sig Dispense Refill  . acetaminophen (TYLENOL) 500 MG tablet Take 500 mg by mouth 2 (two) times daily.        . brimonidine (ALPHAGAN) 0.2 % ophthalmic solution Place 1 drop into both eyes 2 (two) times daily.        Marland Kitchen latanoprost (XALATAN) 0.005 % ophthalmic solution Place 1 drop into both eyes at bedtime.        Marland Kitchen levothyroxine (SYNTHROID, LEVOTHROID) 50 MCG tablet Take 1 tablet (50 mcg total) by mouth daily.  30 tablet  9    Allergies  Allergen Reactions  . Atorvastatin     REACTION: myalgias  . Raloxifene     REACTION: unspecified  . Simvastatin     REACTION: myalgias (pt perceives due to zocor)  . Sulfamethoxazole     REACTION: unspecified    Family History  Problem Relation Age of Onset   . Heart attack Father 41  . Cancer Maternal Grandmother     Uterine Cancer  . Cancer Paternal Grandmother     Stomach Cancer    BP 122/74  Pulse 82  Temp 98.8 F (37.1 C) (Oral)  Wt 142 lb (64.411 kg)  SpO2 98%  Review of Systems Denies numbness and neck swelling.    Objective:   Physical Exam VITAL SIGNS:  See vs page GENERAL: no distress NECK: There is no palpable thyroid enlargement.  No thyroid nodule is palpable.  No palpable lymphadenopathy at the anterior neck.      Assessment & Plan:  Neck sxs, new, uncertain etiology

## 2012-11-21 ENCOUNTER — Telehealth: Payer: Self-pay | Admitting: Endocrinology

## 2012-11-21 MED ORDER — TIZANIDINE HCL 2 MG PO TABS: 2.0000 mg | ORAL_TABLET | Freq: Four times a day (QID) | ORAL | Status: DC | PRN

## 2012-11-21 NOTE — Telephone Encounter (Signed)
i changed to tizanidine, per insurance

## 2012-12-14 ENCOUNTER — Ambulatory Visit (INDEPENDENT_AMBULATORY_CARE_PROVIDER_SITE_OTHER): Payer: Medicare Other | Admitting: Emergency Medicine

## 2012-12-14 VITALS — BP 133/79 | HR 73 | Temp 98.6°F | Resp 17 | Ht 61.5 in | Wt 140.0 lb

## 2012-12-14 DIAGNOSIS — K5732 Diverticulitis of large intestine without perforation or abscess without bleeding: Secondary | ICD-10-CM

## 2012-12-14 LAB — POCT CBC
HCT, POC: 45.9 % (ref 37.7–47.9)
Hemoglobin: 14.5 g/dL (ref 12.2–16.2)
MCH, POC: 30.1 pg (ref 27–31.2)
MPV: 8.8 fL (ref 0–99.8)
POC Granulocyte: 6.9 (ref 2–6.9)
POC MID %: 7.2 %M (ref 0–12)
RBC: 4.81 M/uL (ref 4.04–5.48)
WBC: 9.4 10*3/uL (ref 4.6–10.2)

## 2012-12-14 MED ORDER — CIPROFLOXACIN HCL 500 MG PO TABS: 500.0000 mg | ORAL_TABLET | Freq: Two times a day (BID) | ORAL | Status: DC

## 2012-12-14 MED ORDER — METRONIDAZOLE 500 MG PO TABS: 500.0000 mg | ORAL_TABLET | Freq: Four times a day (QID) | ORAL | Status: DC

## 2012-12-14 NOTE — Patient Instructions (Addendum)
Diverticulitis °A diverticulum is a small pouch or sac on the colon. Diverticulosis is the presence of these diverticula on the colon. Diverticulitis is the irritation (inflammation) or infection of diverticula. °CAUSES  °The colon and its diverticula contain bacteria. If food particles block the tiny opening to a diverticulum, the bacteria inside can grow and cause an increase in pressure. This leads to infection and inflammation and is called diverticulitis. °SYMPTOMS  °· Abdominal pain and tenderness. Usually, the pain is located on the left side of your abdomen. However, it could be located elsewhere. °· Fever. °· Bloating. °· Feeling sick to your stomach (nausea). °· Throwing up (vomiting). °· Abnormal stools. °DIAGNOSIS  °Your caregiver will take a history and perform a physical exam. Since many things can cause abdominal pain, other tests may be necessary. Tests may include: °· Blood tests. °· Urine tests. °· X-ray of the abdomen. °· CT scan of the abdomen. °Sometimes, surgery is needed to determine if diverticulitis or other conditions are causing your symptoms. °TREATMENT  °Most of the time, you can be treated without surgery. Treatment includes: °· Resting the bowels by only having liquids for a few days. As you improve, you will need to eat a low-fiber diet. °· Intravenous (IV) fluids if you are losing body fluids (dehydrated). °· Antibiotic medicines that treat infections may be given. °· Pain and nausea medicine, if needed. °· Surgery if the inflamed diverticulum has burst. °HOME CARE INSTRUCTIONS  °· Try a clear liquid diet (broth, tea, or water for as long as directed by your caregiver). You may then gradually begin a low-fiber diet as tolerated. A low-fiber diet is a diet with less than 10 grams of fiber. Choose the foods below to reduce fiber in the diet: °· White breads, cereals, rice, and pasta. °· Cooked fruits and vegetables or soft fresh fruits and vegetables without the skin. °· Ground or  well-cooked tender beef, ham, veal, lamb, pork, or poultry. °· Eggs and seafood. °· After your diverticulitis symptoms have improved, your caregiver may put you on a high-fiber diet. A high-fiber diet includes 14 grams of fiber for every 1000 calories consumed. For a standard 2000 calorie diet, you would need 28 grams of fiber. Follow these diet guidelines to help you increase the fiber in your diet. It is important to slowly increase the amount fiber in your diet to avoid gas, constipation, and bloating. °· Choose whole-grain breads, cereals, pasta, and brown rice. °· Choose fresh fruits and vegetables with the skin on. Do not overcook vegetables because the more vegetables are cooked, the more fiber is lost. °· Choose more nuts, seeds, legumes, dried peas, beans, and lentils. °· Look for food products that have greater than 3 grams of fiber per serving on the Nutrition Facts label. °· Take all medicine as directed by your caregiver. °· If your caregiver has given you a follow-up appointment, it is very important that you go. Not going could result in lasting (chronic) or permanent injury, pain, and disability. If there is any problem keeping the appointment, call to reschedule. °SEEK MEDICAL CARE IF:  °· Your pain does not improve. °· You have a hard time advancing your diet beyond clear liquids. °· Your bowel movements do not return to normal. °SEEK IMMEDIATE MEDICAL CARE IF:  °· Your pain becomes worse. °· You have an oral temperature above 102° F (38.9° C), not controlled by medicine. °· You have repeated vomiting. °· You have bloody or black, tarry stools. °· Symptoms   that brought you to your caregiver become worse or are not getting better. °MAKE SURE YOU:  °· Understand these instructions. °· Will watch your condition. °· Will get help right away if you are not doing well or get worse. °Document Released: 07/19/2005 Document Revised: 01/01/2012 Document Reviewed: 11/14/2010 °[employer]® Patient Information  ©2013 [employer]. ° °

## 2012-12-14 NOTE — Progress Notes (Signed)
[facility] [address] [place] [address] [phone]  Date:  12/14/2012   Name:  Mia Newman   DOB:  17-Jan-1946   MRN:  [id number]  PCP:  Romero Belling, MD    Chief Complaint: Abdominal Pain and Diverticulosis   History of Present Illness:  Mia Newman is a 67 y.o. very pleasant female patient who presents with the following:  Has diverticulitis by history over the past 20 years.  Last flair about 2 years ago.  Never had surgical consultation.  Pain in LLQ started a week ago and has worsened over the past two day. No nausea or vomiting.  Stools are looser than normal with no blood in stools.  No fever or chills.  Appetite is normal for her.  Patient Active Problem List  Diagnosis  . DM  . PURE HYPERCHOLESTEROLEMIA  . DIVERTICULOSIS, COLON  . DIVERTICULITIS-COLON  . ACUTE CYSTITIS  . ASYMPTOMATIC POSTMENOPAUSAL STATUS  . Encounter for long-term (current) use of other medications  . Other postablative hypothyroidism    Past Medical History  Diagnosis Date  . Unspecified hypothyroidism 04/22/2008  . DM 04/22/2008    borderline  . Pure hypercholesterolemia 09/27/2007  . DIVERTICULITIS-COLON 05/22/2008  . DIVERTICULOSIS, COLON 05/20/2008  . ASYMPTOMATIC POSTMENOPAUSAL STATUS 03/03/2010  . Glaucoma(365)   . Hyperthyroidism   . Irritable bowel syndrome   . Allergy   . Arthritis     Past Surgical History  Procedure Laterality Date  . Vsd repair  1996  . Abdominal hysterectomy  1987 apx  . Tubal ligation    . 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 Breast surgery      History  Substance Use Topics  . Smoking status: Never Smoker   . Smokeless tobacco: Not on file  . Alcohol Use: No    Family History  Problem Relation Age of Onset  . Heart attack Father 57  . Cancer Maternal Grandmother     Uterine Cancer  . Cancer Paternal Grandmother     Stomach Cancer    Allergies  Allergen Reactions  .  Atorvastatin     REACTION: myalgias  . Raloxifene     REACTION: unspecified  . Simvastatin     REACTION: myalgias (pt perceives due to zocor)  . Sulfamethoxazole     REACTION: unspecified    Medication list has been reviewed and updated.  Current Outpatient Prescriptions on File Prior to Visit  Medication Sig Dispense Refill  . acetaminophen (TYLENOL) 500 MG tablet Take 500 mg by mouth 2 (two) times daily.        . brimonidine (ALPHAGAN) 0.2 % ophthalmic solution Place 1 drop into both eyes 2 (two) times daily.        Marland Kitchen latanoprost (XALATAN) 0.005 % ophthalmic solution Place 1 drop into both eyes at bedtime.        Marland Kitchen levothyroxine (SYNTHROID, LEVOTHROID) 50 MCG tablet Take 1 tablet (50 mcg total) by mouth daily.  30 tablet  9  . tiZANidine (ZANAFLEX) 2 MG tablet Take 1 tablet (2 mg total) by mouth every 6 (six) hours as needed (muscle spasms).  30 tablet  2   No current facility-administered medications on file prior to visit.    Review of Systems:  As per HPI, otherwise negative.    Physical Examination: Filed Vitals:   12/14/12 1316  BP: 133/79  Pulse: 73  Temp: 98.6 F (37 C)  Resp: 17   Filed Vitals:   12/14/12 1316  Height: 5' 1.5" (1.562 m)  Weight: 140 lb (63.504 kg)   Body mass index is 26.03 kg/(m^2). Ideal Body Weight: Weight in (lb) to have BMI = 25: 134.2  GEN: WDWN, NAD, Non-toxic, A & O x 3 HEENT: Atraumatic, Normocephalic. Neck supple. No masses, No LAD. Ears and Nose: No external deformity. CV: RRR, No M/G/R. No JVD. No thrill. No extra heart sounds. PULM: CTA B, no wheezes, crackles, rhonchi. No retractions. No resp. distress. No accessory muscle use. ABD: S, tender LLQ, ND, +BS. No rebound. No HSM. EXTR: No c/c/e NEURO Normal gait.  PSYCH: Normally interactive. Conversant. Not depressed or anxious appearing.  Calm demeanor.    Assessment and Plan: Diverticulitis cipro  Flagyl Surgical follow up  Carmelina Dane, MD  Results for  orders placed in visit on 12/14/12  POCT CBC      Result Value Range   WBC 9.4  4.6 - 10.2 K/uL   Lymph, poc 1.8  0.6 - 3.4   POC LYMPH PERCENT 18.9  10 - 50 %L   MID (cbc) 0.7  0 - 0.9   POC MID % 7.2  0 - 12 %M   POC Granulocyte 6.9  2 - 6.9   Granulocyte percent 73.9  37 - 80 %G   RBC 4.81  4.04 - 5.48 M/uL   Hemoglobin 14.5  12.2 - 16.2 g/dL   HCT, POC 65.7  84.6 - 47.9 %   MCV 95.4  80 - 97 fL   MCH, POC 30.1  27 - 31.2 pg   MCHC 31.6 (*) 31.8 - 35.4 g/dL   RDW, POC 96.2     Platelet Count, POC 266  142 - 424 K/uL   MPV 8.8  0 - 99.8 fL

## 2012-12-15 ENCOUNTER — Other Ambulatory Visit: Payer: Self-pay | Admitting: Endocrinology

## 2013-01-17 ENCOUNTER — Other Ambulatory Visit: Payer: Self-pay | Admitting: Endocrinology

## 2013-01-28 DIAGNOSIS — H401290 Low-tension glaucoma, unspecified eye, stage unspecified: Secondary | ICD-10-CM | POA: Diagnosis not present

## 2013-02-15 ENCOUNTER — Other Ambulatory Visit: Payer: Self-pay | Admitting: Endocrinology

## 2013-03-17 ENCOUNTER — Other Ambulatory Visit: Payer: Self-pay | Admitting: Endocrinology

## 2013-04-19 ENCOUNTER — Other Ambulatory Visit: Payer: Self-pay | Admitting: Endocrinology

## 2013-04-28 DIAGNOSIS — H401290 Low-tension glaucoma, unspecified eye, stage unspecified: Secondary | ICD-10-CM | POA: Diagnosis not present

## 2013-05-19 ENCOUNTER — Other Ambulatory Visit: Payer: Self-pay | Admitting: Endocrinology

## 2013-06-18 ENCOUNTER — Other Ambulatory Visit: Payer: Self-pay

## 2013-06-18 ENCOUNTER — Other Ambulatory Visit: Payer: Self-pay | Admitting: Endocrinology

## 2013-06-18 MED ORDER — LEVOTHYROXINE SODIUM 50 MCG PO TABS: ORAL_TABLET | ORAL | Status: DC

## 2013-07-03 ENCOUNTER — Encounter: Payer: Self-pay | Admitting: Internal Medicine

## 2013-07-18 ENCOUNTER — Other Ambulatory Visit: Payer: Self-pay | Admitting: Endocrinology

## 2013-07-29 DIAGNOSIS — H401290 Low-tension glaucoma, unspecified eye, stage unspecified: Secondary | ICD-10-CM | POA: Diagnosis not present

## 2013-08-20 ENCOUNTER — Other Ambulatory Visit: Payer: Self-pay | Admitting: Endocrinology

## 2013-08-20 ENCOUNTER — Other Ambulatory Visit: Payer: Self-pay | Admitting: *Deleted

## 2013-08-20 MED ORDER — LEVOTHYROXINE SODIUM 50 MCG PO TABS: 50.0000 ug | ORAL_TABLET | Freq: Every day | ORAL | Status: DC

## 2013-08-26 ENCOUNTER — Telehealth: Payer: Self-pay | Admitting: Endocrinology

## 2013-08-26 NOTE — Telephone Encounter (Signed)
Pt states she will call back for an appt 

## 2013-08-26 NOTE — Telephone Encounter (Signed)
"  medicare wellness" visit is due.  Let's address then

## 2013-09-02 ENCOUNTER — Encounter: Payer: Self-pay | Admitting: Endocrinology

## 2013-09-02 ENCOUNTER — Ambulatory Visit (INDEPENDENT_AMBULATORY_CARE_PROVIDER_SITE_OTHER): Payer: Medicare Other | Admitting: Endocrinology

## 2013-09-02 VITALS — BP 130/88 | HR 86 | Temp 97.4°F | Ht 61.0 in | Wt 142.9 lb

## 2013-09-02 DIAGNOSIS — Z23 Encounter for immunization: Secondary | ICD-10-CM

## 2013-09-02 DIAGNOSIS — E890 Postprocedural hypothyroidism: Secondary | ICD-10-CM | POA: Diagnosis not present

## 2013-09-02 DIAGNOSIS — Z0000 Encounter for general adult medical examination without abnormal findings: Secondary | ICD-10-CM

## 2013-09-02 DIAGNOSIS — D329 Benign neoplasm of meninges, unspecified: Secondary | ICD-10-CM

## 2013-09-02 DIAGNOSIS — Z79899 Other long term (current) drug therapy: Secondary | ICD-10-CM | POA: Diagnosis not present

## 2013-09-02 DIAGNOSIS — E119 Type 2 diabetes mellitus without complications: Secondary | ICD-10-CM

## 2013-09-02 DIAGNOSIS — E875 Hyperkalemia: Secondary | ICD-10-CM

## 2013-09-02 DIAGNOSIS — E7800 Pure hypercholesterolemia, unspecified: Secondary | ICD-10-CM | POA: Diagnosis not present

## 2013-09-02 DIAGNOSIS — Z780 Asymptomatic menopausal state: Secondary | ICD-10-CM

## 2013-09-02 NOTE — Patient Instructions (Addendum)
i've requested a repeat MRI.  you will receive a phone call, about a day and time for an appointment. please consider these measures for your health:  minimize alcohol.  do not use tobacco products.  have a colonoscopy at least every 10 years from age 66.  Women should have an annual mammogram from age 69.  keep firearms safely stored.  always use seat belts.  have working smoke alarms in your home.  see an eye doctor and dentist regularly.  never drive under the influence of alcohol or drugs (including prescription drugs).  those with fair skin should take precautions against the sun.   it is critically important to prevent falling down (keep floor areas well-lit, dry, and free of loose objects.  If you have a cane, walker, or wheelchair, you should use it, even for short trips around the house.  Also, try not to rush).   good diet and exercise habits significanly improve the control of your blood sugar.  please let me know if you wish to be referred to a dietician.  high blood sugar is very risky to your health.  you should see an eye doctor every year.  You are at higher than average risk for pneumonia and hepatitis-B.  You should be vaccinated against both.  Please return in 1 year.

## 2013-09-02 NOTE — Progress Notes (Signed)
Subjective:    Patient ID: Mia Newman, female    DOB: 06/14/1946, 67 y.o.   MRN: [id number]  HPI The state of at least three ongoing medical problems is addressed today, with interval history of each noted here: Pt says she had resection of a meningoima at [facility] in 2008.  She is due for f/u MRI.  She denies headache. Pt returns for f/u of type 2 DM (dx'ed 2008; no known complications; she has never required medication for this).  Denies chest pain Hypothyroidism: Denies weight change. Past Medical History  Diagnosis Date  . Unspecified hypothyroidism 04/22/2008  . DM 04/22/2008    borderline  . Pure hypercholesterolemia 09/27/2007  . DIVERTICULITIS-COLON 05/22/2008  . DIVERTICULOSIS, COLON 05/20/2008  . ASYMPTOMATIC POSTMENOPAUSAL STATUS 03/03/2010  . Glaucoma   . Hyperthyroidism   . Irritable bowel syndrome   . Allergy   . Arthritis     Past Surgical History  Procedure Laterality Date  . Vsd repair  1996  . Abdominal hysterectomy  1987 apx  . Tubal ligation    . 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 Breast surgery      History   Social History  . Marital Status: Married    Spouse Name: N/A    Number of Children: N/A  . Years of Education: N/A   Occupational History  . Retired    Social History Main Topics  . Smoking status: Never Smoker   . Smokeless tobacco: Not on file  . Alcohol Use: No  . Drug Use: No  . Sexual Activity: Yes    Birth Control/ Protection: None   Other Topics Concern  . Not on file   Social History Narrative  . No narrative on file    Current Outpatient Prescriptions on File Prior to Visit  Medication Sig Dispense Refill  . acetaminophen (TYLENOL) 500 MG tablet Take 500 mg by mouth 2 (two) times daily.        . brimonidine (ALPHAGAN) 0.2 % ophthalmic solution Place 1 drop into both eyes 2 (two) times daily.        Marland Kitchen latanoprost (XALATAN) 0.005 % ophthalmic solution Place 1 drop into both eyes at  bedtime.        Marland Kitchen tiZANidine (ZANAFLEX) 2 MG tablet Take 1 tablet (2 mg total) by mouth every 6 (six) hours as needed (muscle spasms).  30 tablet  2   No current facility-administered medications on file prior to visit.    Allergies  Allergen Reactions  . Atorvastatin     REACTION: myalgias  . Raloxifene     REACTION: unspecified  . Simvastatin     REACTION: myalgias (pt perceives due to zocor)  . Sulfamethoxazole     REACTION: unspecified    Family History  Problem Relation Age of Onset  . Heart attack Father 54  . Cancer Maternal Grandmother     Uterine Cancer  . Cancer Paternal Grandmother     Stomach Cancer    BP 130/88  Pulse 86  Temp(Src) 97.4 F (36.3 C) (Oral)  Ht 5\' 1"  (1.549 m)  Wt 142 lb 14.4 oz (64.819 kg)  BMI 27.01 kg/m2  SpO2 95%  Review of Systems Denies sob and constipation.    Objective:   Physical Exam VITAL SIGNS:  See vs page GENERAL: no distress NECK: There is no palpable thyroid enlargement.  No thyroid nodule is palpable.  No palpable lymphadenopathy at the anterior neck.  Lab Results  Component Value Date   HGBA1C 6.1 09/02/2013   Lab Results  Component Value Date   TSH 11.21* 09/02/2013      Assessment & Plan:  Post-i-131 hypothyroidism: she needs increased rx Meningioma: she is due for recheck DM: stable.  No med needed.    Subjective:   Patient here for Medicare annual wellness visit and management of other chronic and acute problems.     Risk factors: advanced age    Roster of Physicians Providing Medical Care to Patient:  See "snapshot"   Activities of Daily Living: In your present state of health, do you have any difficulty performing the following activities?:  Preparing food and eating?: No  Bathing yourself: No  Getting dressed: No  Using the toilet:No  Moving around from place to place: No  In the past year have you fallen or had a near fall?: No    Home Safety: Has smoke detector and wears seat belts.  Firearms are safely stored. No excess sun exposure.   Diet and Exercise  Current exercise habits: pt says good Dietary issues discussed: pt reports a healthy diet   Depression Screen  Q1: Over the past two weeks, have you felt down, depressed or hopeless?no  Q2: Over the past two weeks, have you felt little interest or pleasure in doing things? no   The following portions of the patient's history were reviewed and updated as appropriate: allergies, current medications, past family history, past medical history, past social history, past surgical history and problem list.  Past Medical History  Diagnosis Date  . Unspecified hypothyroidism 04/22/2008  . DM 04/22/2008    borderline  . Pure hypercholesterolemia 09/27/2007  . DIVERTICULITIS-COLON 05/22/2008  . DIVERTICULOSIS, COLON 05/20/2008  . ASYMPTOMATIC POSTMENOPAUSAL STATUS 03/03/2010  . Glaucoma   . Hyperthyroidism   . Irritable bowel syndrome   . Allergy   . Arthritis     Past Surgical History  Procedure Laterality Date  . Vsd repair  1996  . Abdominal hysterectomy  1987 apx  . Tubal ligation    . 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 Breast surgery      History   Social History  . Marital Status: Married    Spouse Name: N/A    Number of Children: N/A  . Years of Education: N/A   Occupational History  . Retired    Social History Main Topics  . Smoking status: Never Smoker   . Smokeless tobacco: Not on file  . Alcohol Use: No  . Drug Use: No  . Sexual Activity: Yes    Birth Control/ Protection: None   Other Topics Concern  . Not on file   Social History Narrative  . No narrative on file    Current Outpatient Prescriptions on File Prior to Visit  Medication Sig Dispense Refill  . acetaminophen (TYLENOL) 500 MG tablet Take 500 mg by mouth 2 (two) times daily.        . brimonidine (ALPHAGAN) 0.2 % ophthalmic solution Place 1 drop into both eyes 2 (two) times daily.        Marland Kitchen  latanoprost (XALATAN) 0.005 % ophthalmic solution Place 1 drop into both eyes at bedtime.        Marland Kitchen levothyroxine (SYNTHROID, LEVOTHROID) 50 MCG tablet Take 1 tablet (50 mcg total) by mouth daily before breakfast.  30 tablet  2  . tiZANidine (ZANAFLEX) 2 MG tablet Take 1 tablet (2  mg total) by mouth every 6 (six) hours as needed (muscle spasms).  30 tablet  2   No current facility-administered medications on file prior to visit.   Allergies  Allergen Reactions  . Atorvastatin     REACTION: myalgias  . Raloxifene     REACTION: unspecified  . Simvastatin     REACTION: myalgias (pt perceives due to zocor)  . Sulfamethoxazole     REACTION: unspecified   Family History  Problem Relation Age of Onset  . Heart attack Father 61  . Cancer Maternal Grandmother     Uterine Cancer  . Cancer Paternal Grandmother     Stomach Cancer   BP 130/88  Pulse 86  Temp(Src) 97.4 F (36.3 C) (Oral)  Ht 5\' 1"  (1.549 m)  Wt 142 lb 14.4 oz (64.819 kg)  BMI 27.01 kg/m2  SpO2 95%  Review of Systems  Denies hearing loss, and visual loss Objective:   Vision:  Sees opthalmologist Hearing: grossly normal Body mass index:  See vs page Msk: pt easily and quickly performs "get-up-and-go" from a sitting position Cognitive Impairment Assessment: cognition, memory and judgment appear normal.  remembers 3/3 at 5 minutes.  excellent recall.  can easily read and write a sentence.  alert and oriented x 3  Assessment:   Medicare wellness utd on preventive parameters.   Plan:   During the course of the visit the patient was educated and counseled about appropriate screening and preventive services including:        Fall prevention   Screening mammography  Bone densitometry screening  Diabetes screening  Nutrition counseling   Vaccines / LABS Zostavax / Pneumococcal Vaccine  today  PSA  Patient Instructions (the written plan) was given to the patient.   we discussed code status.  pt requests full code,  but would not want to be started or maintained on artificial life-support measures if there was not a reasonable chance of recovery

## 2013-09-03 ENCOUNTER — Telehealth: Payer: Self-pay | Admitting: Endocrinology

## 2013-09-03 DIAGNOSIS — E875 Hyperkalemia: Secondary | ICD-10-CM | POA: Insufficient documentation

## 2013-09-03 LAB — CBC WITH DIFFERENTIAL/PLATELET
Basophils Relative: 1 % (ref 0.0–3.0)
Eosinophils Relative: 3.4 % (ref 0.0–5.0)
HCT: 42.7 % (ref 36.0–46.0)
Hemoglobin: 14.3 g/dL (ref 12.0–15.0)
Lymphocytes Relative: 36 % (ref 12.0–46.0)
Lymphs Abs: 2.1 10*3/uL (ref 0.7–4.0)
MCV: 92.6 fl (ref 78.0–100.0)
Monocytes Relative: 8.4 % (ref 3.0–12.0)
Neutro Abs: 2.9 10*3/uL (ref 1.4–7.7)
Platelets: 256 10*3/uL (ref 150.0–400.0)
RBC: 4.61 Mil/uL (ref 3.87–5.11)

## 2013-09-03 LAB — LIPID PANEL
Cholesterol: 308 mg/dL — ABNORMAL HIGH (ref 0–200)
HDL: 37.3 mg/dL — ABNORMAL LOW (ref >39.00)
Triglycerides: 330 mg/dL — ABNORMAL HIGH (ref 0.0–149.0)

## 2013-09-03 LAB — URINALYSIS, ROUTINE W REFLEX MICROSCOPIC
Ketones, ur: NEGATIVE
RBC / HPF: NONE SEEN (ref 0–?)
Specific Gravity, Urine: <=1.005 (ref 1.000–1.030)
Total Protein, Urine: NEGATIVE
Urine Glucose: NEGATIVE
pH: 7 (ref 5.0–8.0)

## 2013-09-03 LAB — BASIC METABOLIC PANEL
BUN: 10 mg/dL (ref 6–23)
CO2: 29 mEq/L (ref 19–32)
Chloride: 104 mEq/L (ref 96–112)
Potassium: 6.5 mEq/L (ref 3.5–5.1)

## 2013-09-03 LAB — LDL CHOLESTEROL, DIRECT: Direct LDL: 231.1 mg/dL

## 2013-09-03 LAB — TSH: TSH: 11.21 u[IU]/mL — ABNORMAL HIGH (ref 0.35–5.50)

## 2013-09-03 LAB — HEPATIC FUNCTION PANEL
AST: 20 U/L (ref 0–37)
Albumin: 4 g/dL (ref 3.5–5.2)
Alkaline Phosphatase: 49 U/L (ref 39–117)
Bilirubin, Direct: 0.1 mg/dL (ref 0.0–0.3)
Total Bilirubin: 0.5 mg/dL (ref 0.3–1.2)
Total Protein: 6.7 g/dL (ref 6.0–8.3)

## 2013-09-03 LAB — MICROALBUMIN / CREATININE URINE RATIO
Creatinine,U: 14.9 mg/dL
Microalb Creat Ratio: 3.4 mg/g (ref 0.0–30.0)

## 2013-09-03 LAB — HEMOGLOBIN A1C: Hgb A1c MFr Bld: 6.1 % (ref 4.6–6.5)

## 2013-09-03 NOTE — Telephone Encounter (Signed)
please call patient: Potassium is high, but this may be a lab error Please come in for a recheck.  i have ordered.

## 2013-09-04 MED ORDER — LEVOTHYROXINE SODIUM 75 MCG PO TABS: 75.0000 ug | ORAL_TABLET | Freq: Every day | ORAL | Status: DC

## 2013-09-04 NOTE — Telephone Encounter (Signed)
Potassium is high, but this may be a lab error. Please come in for a recheck. Dr Everardo All has put an order in for this. Pt states she will try to come by tomorrow.

## 2013-09-11 ENCOUNTER — Other Ambulatory Visit (INDEPENDENT_AMBULATORY_CARE_PROVIDER_SITE_OTHER): Payer: Medicare Other

## 2013-09-11 DIAGNOSIS — E875 Hyperkalemia: Secondary | ICD-10-CM

## 2013-09-11 LAB — POTASSIUM: Potassium: 3.8 mEq/L (ref 3.5–5.1)

## 2013-09-11 NOTE — Addendum Note (Signed)
Addended by: Nieko Clarin, [place] A on: 09/11/2013 03:52 PM   Modules accepted: Orders

## 2013-09-15 ENCOUNTER — Ambulatory Visit
Admission: RE | Admit: 2013-09-15 | Discharge: 2013-09-15 | Disposition: A | Discharge status: Home / Self Care | Payer: Medicare Other | Source: Ambulatory Visit | Attending: Endocrinology | Admitting: Endocrinology

## 2013-09-15 DIAGNOSIS — R930 Abnormal findings on diagnostic imaging of skull and head, not elsewhere classified: Secondary | ICD-10-CM | POA: Diagnosis not present

## 2013-09-15 MED ORDER — GADOBENATE DIMEGLUMINE 529 MG/ML IV SOLN
13.0000 mL | Freq: Once | INTRAVENOUS | Status: AC | PRN
  Administered 2013-09-15: 13 mL via INTRAVENOUS

## 2013-09-22 ENCOUNTER — Telehealth: Payer: Self-pay | Admitting: Endocrinology

## 2013-09-22 NOTE — Telephone Encounter (Signed)
Pt states that her MRI was faxed to [facility] but the fax number was wrong  New fax number [phone]  Thank You :)

## 2013-09-23 NOTE — Telephone Encounter (Signed)
Results faxed back to new number.

## 2013-10-29 DIAGNOSIS — H401290 Low-tension glaucoma, unspecified eye, stage unspecified: Secondary | ICD-10-CM | POA: Diagnosis not present

## 2013-10-30 ENCOUNTER — Encounter (HOSPITAL_COMMUNITY): Payer: Self-pay | Admitting: Emergency Medicine

## 2013-10-30 ENCOUNTER — Emergency Department (INDEPENDENT_AMBULATORY_CARE_PROVIDER_SITE_OTHER)
Admission: EM | Admit: 2013-10-30 | Discharge: 2013-10-30 | Disposition: A | Discharge status: Home / Self Care | Payer: Medicare Other | Source: Home / Self Care

## 2013-10-30 DIAGNOSIS — D320 Benign neoplasm of cerebral meninges: Secondary | ICD-10-CM | POA: Diagnosis not present

## 2013-10-30 DIAGNOSIS — K5732 Diverticulitis of large intestine without perforation or abscess without bleeding: Secondary | ICD-10-CM | POA: Diagnosis not present

## 2013-10-30 MED ORDER — CIPROFLOXACIN HCL 500 MG PO TABS: 500.0000 mg | ORAL_TABLET | Freq: Two times a day (BID) | ORAL | Status: DC

## 2013-10-30 MED ORDER — METRONIDAZOLE 500 MG PO TABS: 500.0000 mg | ORAL_TABLET | Freq: Two times a day (BID) | ORAL | Status: DC

## 2013-10-30 NOTE — Discharge Instructions (Signed)
Diverticulitis °A diverticulum is a small pouch or sac on the colon. Diverticulosis is the presence of these diverticula on the colon. Diverticulitis is the irritation (inflammation) or infection of diverticula. °CAUSES  °The colon and its diverticula contain bacteria. If food particles block the tiny opening to a diverticulum, the bacteria inside can grow and cause an increase in pressure. This leads to infection and inflammation and is called diverticulitis. °SYMPTOMS  °· Abdominal pain and tenderness. Usually, the pain is located on the left side of your abdomen. However, it could be located elsewhere. °· Fever. °· Bloating. °· Feeling sick to your stomach (nausea). °· Throwing up (vomiting). °· Abnormal stools. °DIAGNOSIS  °Your caregiver will take a history and perform a physical exam. Since many things can cause abdominal pain, other tests may be necessary. Tests may include: °· Blood tests. °· Urine tests. °· X-ray of the abdomen. °· CT scan of the abdomen. °Sometimes, surgery is needed to determine if diverticulitis or other conditions are causing your symptoms. °TREATMENT  °Most of the time, you can be treated without surgery. Treatment includes: °· Resting the bowels by only having liquids for a few days. As you improve, you will need to eat a low-fiber diet. °· Intravenous (IV) fluids if you are losing body fluids (dehydrated). °· Antibiotic medicines that treat infections may be given. °· Pain and nausea medicine, if needed. °· Surgery if the inflamed diverticulum has burst. °HOME CARE INSTRUCTIONS  °· Try a clear liquid diet (broth, tea, or water for as long as directed by your caregiver). You may then gradually begin a low-fiber diet as tolerated.  °A low-fiber diet is a diet with less than 10 grams of fiber. Choose the foods below to reduce fiber in the diet: °· White breads, cereals, rice, and pasta. °· Cooked fruits and vegetables or soft fresh fruits and vegetables without the skin. °· Ground or  well-cooked tender beef, ham, veal, lamb, pork, or poultry. °· Eggs and seafood. °· After your diverticulitis symptoms have improved, your caregiver may put you on a high-fiber diet. A high-fiber diet includes 14 grams of fiber for every 1000 calories consumed. For a standard 2000 calorie diet, you would need 28 grams of fiber. Follow these diet guidelines to help you increase the fiber in your diet. It is important to slowly increase the amount fiber in your diet to avoid gas, constipation, and bloating. °· Choose whole-grain breads, cereals, pasta, and brown rice. °· Choose fresh fruits and vegetables with the skin on. Do not overcook vegetables because the more vegetables are cooked, the more fiber is lost. °· Choose more nuts, seeds, legumes, dried peas, beans, and lentils. °· Look for food products that have greater than 3 grams of fiber per serving on the Nutrition Facts label. °· Take all medicine as directed by your caregiver. °· If your caregiver has given you a follow-up appointment, it is very important that you go. Not going could result in lasting (chronic) or permanent injury, pain, and disability. If there is any problem keeping the appointment, call to reschedule. °SEEK MEDICAL CARE IF:  °· Your pain does not improve. °· You have a hard time advancing your diet beyond clear liquids. °· Your bowel movements do not return to normal. °SEEK IMMEDIATE MEDICAL CARE IF:  °· Your pain becomes worse. °· You have an oral temperature above 102° F (38.9° C), not controlled by medicine. °· You have repeated vomiting. °· You have bloody or black, tarry stools. °·   Symptoms that brought you to your caregiver become worse or are not getting better. °MAKE SURE YOU:  °· Understand these instructions. °· Will watch your condition. °· Will get help right away if you are not doing well or get worse. °Document Released: 07/19/2005 Document Revised: 01/01/2012 Document Reviewed: 11/14/2010 °[employer]® Patient Information  ©2014 [employer]. ° °

## 2013-10-30 NOTE — ED Notes (Addendum)
Pt  Reports  Has  A  History  Of  chrones  Dieses   She  Reports  A  Flair  Up  She  States    Anti biotics  Usually  Clear the  Symptoms  Up  denys  Any  Urinary  Symptoms       denys  Any  Vomiting   Sitting  Upright on  Exam table  Speaking in  Complete  sentances

## 2013-10-30 NOTE — ED Provider Notes (Signed)
CSN: [id number]     Arrival date & time 10/30/13  1428 History   First MD Initiated Contact with Patient 10/30/13 1522     Chief Complaint  Patient presents with  . Abdominal Pain   (Consider location/radiation/quality/duration/timing/severity/associated sxs/prior Treatment) HPI Comments: 68 year old female presents with a complaint of a flareup of diverticulitis. She said she started to have discomfort in her left lower quadrant 3-4 days ago. The pain is described as crampy and comes and goes. It became worse today. The cramping today is midline over the supra-pubic area. She denies diarrhea or bleeding. She did endorse the fact that she has been straining and happened several small hard balls of stool but emphatically denies that she is constipated. Past medical history also includes IBS.   Past Medical History  Diagnosis Date  . Unspecified hypothyroidism 04/22/2008  . DM 04/22/2008    borderline  . Pure hypercholesterolemia 09/27/2007  . DIVERTICULITIS-COLON 05/22/2008  . DIVERTICULOSIS, COLON 05/20/2008  . ASYMPTOMATIC POSTMENOPAUSAL STATUS 03/03/2010  . Glaucoma   . Hyperthyroidism   . Irritable bowel syndrome   . Allergy   . Arthritis    Past Surgical History  Procedure Laterality Date  . Vsd repair  1996  . Abdominal hysterectomy  1987 apx  . Tubal ligation    . 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 Breast surgery     Family History  Problem Relation Age of Onset  . Heart attack Father 79  . Cancer Maternal Grandmother     Uterine Cancer  . Cancer Paternal Grandmother     Stomach Cancer   History  Substance Use Topics  . Smoking status: Never Smoker   . Smokeless tobacco: Not on file  . Alcohol Use: No   OB History   Grav Para Term Preterm Abortions TAB SAB Ect Mult Living                 Review of Systems  Constitutional: Negative.  Negative for fever and fatigue.  Respiratory: Negative.   Cardiovascular: Negative.    Gastrointestinal: Positive for abdominal pain. Negative for nausea, vomiting, diarrhea, constipation, blood in stool, abdominal distention, anal bleeding and rectal pain.  Genitourinary: Negative.   Musculoskeletal: Negative.   Skin: Negative.   Neurological: Negative for dizziness, speech difficulty and headaches.    Allergies  Atorvastatin; Raloxifene; Simvastatin; and Sulfamethoxazole  Home Medications   Current Outpatient Rx  Name  Route  Sig  Dispense  Refill  . acetaminophen (TYLENOL) 500 MG tablet   Oral   Take 500 mg by mouth 2 (two) times daily.           . brimonidine (ALPHAGAN) 0.2 % ophthalmic solution   Both Eyes   Place 1 drop into both eyes 2 (two) times daily.           . ciprofloxacin (CIPRO) 500 MG tablet   Oral   Take 1 tablet (500 mg total) by mouth 2 (two) times daily.   14 tablet   0   . latanoprost (XALATAN) 0.005 % ophthalmic solution   Both Eyes   Place 1 drop into both eyes at bedtime.           Marland Kitchen levothyroxine (SYNTHROID, LEVOTHROID) 75 MCG tablet   Oral   Take 1 tablet (75 mcg total) by mouth daily before breakfast.   30 tablet   5   . metroNIDAZOLE (FLAGYL) 500 MG tablet   Oral  Take 1 tablet (500 mg total) by mouth 2 (two) times daily. X 7 days   14 tablet   0   . tiZANidine (ZANAFLEX) 2 MG tablet   Oral   Take 1 tablet (2 mg total) by mouth every 6 (six) hours as needed (muscle spasms).   30 tablet   2    BP 138/62  Pulse 77  Temp(Src) 98 F (36.7 C) (Oral)  Resp 16  SpO2 98% Physical Exam  Nursing note and vitals reviewed. Constitutional: She is oriented to person, place, and time. She appears well-developed and well-nourished. No distress.  Neck: Normal range of motion. Neck supple.  Cardiovascular: Normal rate, regular rhythm and normal heart sounds.   Pulmonary/Chest: Effort normal and breath sounds normal. No respiratory distress.  Abdominal: Soft. Bowel sounds are normal. She exhibits no distension and no  mass. There is no tenderness. There is no rebound and no guarding.  Neurological: She is alert and oriented to person, place, and time. She exhibits normal muscle tone.  Skin: Skin is warm and dry.  Psychiatric: She has a normal mood and affect.    ED Course  Procedures (including critical care time) Labs Review Labs Reviewed - No data to display Imaging Review No results found.    MDM   1. Diverticulitis of rectosigmoid      Working diagnosis is diverticulitis however differential would include IBS and constipation. The patient emphatically denies that she is constipated or has IBS symptoms. She is requesting antibiotics for Flagyl and Cipro which have been given to her. Followup with PCP  Janne Napoleon, NP 10/30/13 1546

## 2013-11-03 NOTE — ED Provider Notes (Signed)
Medical screening examination/treatment/procedure(s) were performed by resident physician or non-physician practitioner and as supervising physician I was immediately available for consultation/collaboration.   Pauline Good MD.   Billy Fischer, MD 11/03/13 [phone]

## 2013-11-21 ENCOUNTER — Telehealth: Payer: Self-pay | Admitting: Internal Medicine

## 2013-11-21 ENCOUNTER — Other Ambulatory Visit (INDEPENDENT_AMBULATORY_CARE_PROVIDER_SITE_OTHER): Payer: Medicare Other

## 2013-11-21 ENCOUNTER — Ambulatory Visit (INDEPENDENT_AMBULATORY_CARE_PROVIDER_SITE_OTHER): Payer: Medicare Other | Admitting: Gastroenterology

## 2013-11-21 ENCOUNTER — Encounter: Payer: Self-pay | Admitting: Gastroenterology

## 2013-11-21 VITALS — BP 130/76 | HR 83 | Ht 62.0 in | Wt 131.0 lb

## 2013-11-21 DIAGNOSIS — R1032 Left lower quadrant pain: Secondary | ICD-10-CM | POA: Insufficient documentation

## 2013-11-21 DIAGNOSIS — Z8719 Personal history of other diseases of the digestive system: Secondary | ICD-10-CM

## 2013-11-21 LAB — BASIC METABOLIC PANEL
BUN: 8 mg/dL (ref 6–23)
CO2: 29 mEq/L (ref 19–32)
CREATININE: 0.6 mg/dL (ref 0.4–1.2)
Calcium: 9.3 mg/dL (ref 8.4–10.5)
Chloride: 105 mEq/L (ref 96–112)
GFR: 104.01 mL/min (ref >60.00)
Glucose, Bld: 82 mg/dL (ref 70–99)
Potassium: 3.9 mEq/L (ref 3.5–5.1)
Sodium: 140 mEq/L (ref 135–145)

## 2013-11-21 LAB — CBC WITH DIFFERENTIAL/PLATELET
BASOS ABS: 0.1 10*3/uL (ref 0.0–0.1)
Basophils Relative: 0.9 % (ref 0.0–3.0)
EOS ABS: 0.1 10*3/uL (ref 0.0–0.7)
Eosinophils Relative: 1.7 % (ref 0.0–5.0)
HCT: 44.8 % (ref 36.0–46.0)
HEMOGLOBIN: 14.3 g/dL (ref 12.0–15.0)
Lymphocytes Relative: 27.3 % (ref 12.0–46.0)
Lymphs Abs: 2.2 10*3/uL (ref 0.7–4.0)
MCHC: 31.9 g/dL (ref 30.0–36.0)
MCV: 95.4 fl (ref 78.0–100.0)
Monocytes Absolute: 0.6 10*3/uL (ref 0.1–1.0)
Monocytes Relative: 7.1 % (ref 3.0–12.0)
NEUTROS ABS: 5.1 10*3/uL (ref 1.4–7.7)
Neutrophils Relative %: 63 % (ref 43.0–77.0)
Platelets: 338 10*3/uL (ref 150.0–400.0)
RBC: 4.7 Mil/uL (ref 3.87–5.11)
RDW: 13.6 % (ref 11.5–14.6)
WBC: 8 10*3/uL (ref 4.5–10.5)

## 2013-11-21 MED ORDER — HYOSCYAMINE SULFATE 0.125 MG SL SUBL: 0.1250 mg | SUBLINGUAL_TABLET | Freq: Four times a day (QID) | SUBLINGUAL | Status: DC | PRN

## 2013-11-21 MED ORDER — HYOSCYAMINE SULFATE 0.125 MG SL SUBL
0.1250 mg | SUBLINGUAL_TABLET | Freq: Four times a day (QID) | SUBLINGUAL | Status: DC | PRN
Start: 2013-11-21 — End: 2014-03-16

## 2013-11-21 NOTE — Patient Instructions (Signed)
Please go to the basement level to have your labs drawn.  We sent a prescription to your pharmacy . 1. Levsin SL 0.125 mg for cramping and spasms.   You have been scheduled for a CT scan of the abdomen and pelvis at [facility] ([address]---this is in the same building as [facility]).   You are scheduled on Monday 11-24-2013 at 3:00 pm . You should arrive 15 minutes prior to your appointment time for registration. Please follow the written instructions below on the day of your exam:  WARNING: IF YOU ARE ALLERGIC TO IODINE/X-RAY DYE, PLEASE NOTIFY RADIOLOGY IMMEDIATELY AT [phone]! YOU WILL BE GIVEN A 13 HOUR PREMEDICATION PREP.  1) Do not eat or drink anything after 11:00 am  (4 hours prior to your test) 2) You have been given 2 bottles of oral contrast to drink. The solution may taste better if refrigerated, but do NOT add ice or any other liquid to this solution. Shake  well before drinking.    Drink 1 bottle of contrast @ 1:00 PM  (2 hours prior to your exam)  Drink 1 bottle of contrast @ 2:00 PM  (1 hour prior to your exam)  You may take any medications as prescribed with a small amount of water except for the following: Metformin, Glucophage, Glucovance, Avandamet, Riomet, Fortamet, Actoplus Met, Janumet, Glumetza or Metaglip. The above medications must be held the day of the exam AND 48 hours after the exam.  The purpose of you drinking the oral contrast is to aid in the visualization of your intestinal tract. The contrast solution may cause some diarrhea. Before your exam is started, you will be given a small amount of fluid to drink. Depending on your individual set of symptoms, you may also receive an intravenous injection of x-ray contrast/dye. Plan on being at [facility] for 30 minutes or long, depending on the type of exam you are having performed.  If you have any questions regarding your exam or if you need to reschedule, you may call the CT  department at [phone] between the hours of 8:00 am and 5:00 pm, Monday-Friday.  ________________________________________________________________________  

## 2013-11-21 NOTE — Progress Notes (Signed)
11/21/2013 Mia Newman [id number] Mar 11, 1946   HISTORY OF PRESENT ILLNESS:  This is a pleasant 68 year old female who is known to Dr. Carlean Purl for previous colonoscopy. Her last colonoscopy was in August of 2009 at which time she had pan-diverticulosis, which was severe in the sigmoid colon. Otherwise the colon was normal with excellent prep, however, she did have deep folds associated with diverticulosis making visualization somewhat difficult so it was his recommendation for repeat colonoscopy in 5 years from that time.  She's had issues with recurrent diverticulitis over the years as well. Approximately 3 weeks ago she was having left lower quadrant abdominal pain and went to an urgent care. Did not perform any laboratory evaluation or imaging, but empirically treated her for diverticulitis with a 7 day course of Cipro and Flagyl. She finished that approximately 2 weeks ago. She says that the pain is still present, but less severe. She has not had any nausea, vomiting, or fevers. She did have some chills initially. Says that her appetite is okay. There has been no rectal bleeding. She was told her symptoms persisted and she needed to follow up with her gastroenterologist.     Past Medical History  Diagnosis Date  . Unspecified hypothyroidism 04/22/2008  . DM 04/22/2008    borderline  . Pure hypercholesterolemia 09/27/2007  . DIVERTICULITIS-COLON 05/22/2008  . DIVERTICULOSIS, COLON 05/20/2008  . ASYMPTOMATIC POSTMENOPAUSAL STATUS 03/03/2010  . Glaucoma   . Hyperthyroidism   . Irritable bowel syndrome   . Allergy   . Arthritis    Past Surgical History  Procedure Laterality Date  . Vsd repair  1996  . Abdominal hysterectomy  1987 apx  . Tubal ligation    . 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 Breast surgery      reports that she has never smoked. She does not have any smokeless tobacco history on file. She reports that she does not drink alcohol or use  illicit drugs. family history includes Cancer in her maternal grandmother and paternal grandmother; Heart attack (age of onset: 35) in her father. Allergies  Allergen Reactions  . Atorvastatin     REACTION: myalgias  . Raloxifene     REACTION: unspecified  . Simvastatin     REACTION: myalgias (pt perceives due to zocor)  . Sulfamethoxazole     REACTION: unspecified      Outpatient Encounter Prescriptions as of 11/21/2013  Medication Sig  . acetaminophen (TYLENOL) 500 MG tablet Take 500 mg by mouth 2 (two) times daily.    . brimonidine (ALPHAGAN) 0.2 % ophthalmic solution Place 1 drop into both eyes 2 (two) times daily.    Marland Kitchen latanoprost (XALATAN) 0.005 % ophthalmic solution Place 1 drop into both eyes at bedtime.    Marland Kitchen levothyroxine (SYNTHROID, LEVOTHROID) 75 MCG tablet Take 1 tablet (75 mcg total) by mouth daily before breakfast.  . tiZANidine (ZANAFLEX) 2 MG tablet Take 1 tablet (2 mg total) by mouth every 6 (six) hours as needed (muscle spasms).  . [DISCONTINUED] ciprofloxacin (CIPRO) 500 MG tablet Take 1 tablet (500 mg total) by mouth 2 (two) times daily.  . [DISCONTINUED] metroNIDAZOLE (FLAGYL) 500 MG tablet Take 1 tablet (500 mg total) by mouth 2 (two) times daily. X 7 days     REVIEW OF SYSTEMS  : All other systems reviewed and negative except where noted in the History of Present Illness.   PHYSICAL EXAM: BP 130/76  Pulse 83  Ht 5\' 2"  (  1.575 m)  Wt 131 lb (59.421 kg)  BMI 23.95 kg/m2 General: Well developed white female in no acute distress Head: Normocephalic and atraumatic Eyes:  Sclerae anicteric, conjunctiva pink. Ears: Normal auditory acuity. Lungs: Clear throughout to auscultation Heart: Regular rate and rhythm Abdomen: Soft, non-distended.  BS present.  Mild-moderate LLQ TTP without R/R/G. Musculoskeletal: Symmetrical with no gross deformities  Skin: No lesions on visible extremities Extremities: No edema  Neurological: Alert oriented x 4, grossly  non-focal. Psychological:  Alert and cooperative. Normal mood and affect  ASSESSMENT AND PLAN: -Lower quadrant abdominal pain in patient with history of diverticulitis:  She underwent treatment with Cipro and Flagyl for 7 days and completed that about 2 weeks ago. Still having some similar pains although not as severe. Question if this is still some residual diverticulitis versus spasm.  We'll check a CT scan of the abdomen and pelvis with contrast on Monday. We'll check labs including CBC and a BMP. For the weekend she will try some Levsin as needed.  If she does still have sign of diverticulitis on CT scan then she would likely need a repeat course of treatment. She is overdue for colonoscopy so we will schedule that for approximately 6 weeks from now with Dr. Carlean Purl.  The risks, benefits, and alternatives were discussed with the patient and she consents to proceed.

## 2013-11-21 NOTE — Progress Notes (Signed)
Agree with [name]'s management. 

## 2013-11-21 NOTE — Telephone Encounter (Signed)
Patient recently treated for diverticulitis at Urgent care.  Was on cipro and flagyl for 7 days.  She is still having lower abdominal pain and cramping.  She will come in and see Alonza Bogus, PA at 1:30 today

## 2013-11-24 ENCOUNTER — Ambulatory Visit (INDEPENDENT_AMBULATORY_CARE_PROVIDER_SITE_OTHER)
Admission: RE | Admit: 2013-11-24 | Discharge: 2013-11-24 | Disposition: A | Discharge status: Home / Self Care | Payer: Medicare Other | Source: Ambulatory Visit | Attending: Gastroenterology | Admitting: Gastroenterology

## 2013-11-24 DIAGNOSIS — Z8719 Personal history of other diseases of the digestive system: Secondary | ICD-10-CM | POA: Diagnosis not present

## 2013-11-24 DIAGNOSIS — R1032 Left lower quadrant pain: Secondary | ICD-10-CM

## 2013-11-24 DIAGNOSIS — K5730 Diverticulosis of large intestine without perforation or abscess without bleeding: Secondary | ICD-10-CM | POA: Diagnosis not present

## 2013-11-24 MED ORDER — IOHEXOL 300 MG/ML  SOLN: 100.0000 mL | Freq: Once | INTRAMUSCULAR | Status: AC | PRN

## 2013-11-24 MED ORDER — IOHEXOL 300 MG/ML  SOLN
100.0000 mL | Freq: Once | INTRAMUSCULAR | Status: AC | PRN
  Administered 2013-11-24: 100 mL via INTRAVENOUS

## 2013-11-25 ENCOUNTER — Other Ambulatory Visit: Payer: Self-pay | Admitting: *Deleted

## 2013-11-25 MED ORDER — METRONIDAZOLE 500 MG PO TABS
ORAL_TABLET | ORAL | Status: DC
Start: 2013-11-25 — End: 2014-02-13

## 2013-11-25 MED ORDER — CIPROFLOXACIN HCL 500 MG PO TABS: ORAL_TABLET | ORAL | Status: DC

## 2013-12-29 ENCOUNTER — Telehealth: Payer: Self-pay | Admitting: Gastroenterology

## 2013-12-29 NOTE — Telephone Encounter (Signed)
Spoke with patient and is calling to report she started having lower abdominal pain again last week. She has recently completed 2 rounds of antibiotics for diverticulitis. The pain is below her belly button and is crampy. Also, reports rectal spasms. She is eating a soft diet. States she had loose stool on Saturday but normal yesterday.Please, advise.

## 2013-12-30 NOTE — Telephone Encounter (Signed)
Left a message for patient to call back. 

## 2013-12-30 NOTE — Telephone Encounter (Signed)
Spoke with patient and she states she is feeling better today but still has some cramping and rectal spasms. She will try the clear liquid diet and Levsin to see if this helps. Patient states she has not set up her colonoscopy because she wants to get over the pain and taking antibiotics before she sets it up. States she will call back to schedule.

## 2013-12-30 NOTE — Telephone Encounter (Signed)
See how patient is feeling today.  If she is not feeling anyway worse, then may just be able to try clear liquid diet for a couple of days before re-imaging or initiation of antibiotics.  Also have her try the levsin that she was given previously as well.  Please ask her about the colonoscopy that was supposed to have been scheduled at her last visit as well.  Thank you,  Jess

## 2014-01-06 ENCOUNTER — Encounter: Payer: Self-pay | Admitting: Internal Medicine

## 2014-02-10 DIAGNOSIS — H4011X0 Primary open-angle glaucoma, stage unspecified: Secondary | ICD-10-CM | POA: Diagnosis not present

## 2014-02-13 ENCOUNTER — Ambulatory Visit (AMBULATORY_SURGERY_CENTER): Payer: Self-pay

## 2014-02-13 VITALS — Ht 61.0 in | Wt 129.0 lb

## 2014-02-13 DIAGNOSIS — Z8719 Personal history of other diseases of the digestive system: Secondary | ICD-10-CM

## 2014-02-13 MED ORDER — SUPREP BOWEL PREP KIT 17.5-3.13-1.6 GM/177ML PO SOLN: 1.0000 | Freq: Once | ORAL | Status: DC

## 2014-02-13 NOTE — Progress Notes (Signed)
No allergies to eggs or soy. No home oxygen. No  Diet/weight loss meds. Has email.  Emmi instructions given for colonoscopy.

## 2014-02-27 ENCOUNTER — Encounter: Payer: Self-pay | Admitting: Internal Medicine

## 2014-02-27 ENCOUNTER — Ambulatory Visit (AMBULATORY_SURGERY_CENTER): Payer: Medicare Other | Admitting: Internal Medicine

## 2014-02-27 VITALS — BP 168/91 | HR 55 | Temp 96.5°F | Resp 27 | Ht 61.0 in | Wt 129.0 lb

## 2014-02-27 DIAGNOSIS — K5730 Diverticulosis of large intestine without perforation or abscess without bleeding: Secondary | ICD-10-CM

## 2014-02-27 DIAGNOSIS — D126 Benign neoplasm of colon, unspecified: Secondary | ICD-10-CM | POA: Diagnosis not present

## 2014-02-27 DIAGNOSIS — E7800 Pure hypercholesterolemia, unspecified: Secondary | ICD-10-CM | POA: Diagnosis not present

## 2014-02-27 DIAGNOSIS — Z1211 Encounter for screening for malignant neoplasm of colon: Secondary | ICD-10-CM | POA: Diagnosis not present

## 2014-02-27 MED ORDER — SODIUM CHLORIDE 0.9 % IV SOLN: 500.0000 mL | INTRAVENOUS | Status: DC

## 2014-02-27 NOTE — Patient Instructions (Addendum)
I found and removed one polyp - it looks benign.  I will let you know pathology results and when to have another routine colonoscopy by mail.  I appreciate the opportunity to care for you. Gatha Mayer, MD, [facility]   Discharge instructions given with verbal understanding. Handouts on polyps and diverticulosis. Resume previous medications. YOU HAD AN ENDOSCOPIC PROCEDURE TODAY AT [facility]: Refer to the procedure report that was given to you for any specific questions about what was found during the examination.  If the procedure report does not answer your questions, please call your gastroenterologist to clarify.  If you requested that your care partner not be given the details of your procedure findings, then the procedure report has been included in a sealed envelope for you to review at your convenience later.  YOU SHOULD EXPECT: Some feelings of bloating in the abdomen. Passage of more gas than usual.  Walking can help get rid of the air that was put into your GI tract during the procedure and reduce the bloating. If you had a lower endoscopy (such as a colonoscopy or flexible sigmoidoscopy) you may notice spotting of blood in your stool or on the toilet paper. If you underwent a bowel prep for your procedure, then you may not have a normal bowel movement for a few days.  DIET: Your first meal following the procedure should be a light meal and then it is ok to progress to your normal diet.  A half-sandwich or bowl of soup is an example of a good first meal.  Heavy or fried foods are harder to digest and may make you feel nauseous or bloated.  Likewise meals heavy in dairy and vegetables can cause extra gas to form and this can also increase the bloating.  Drink plenty of fluids but you should avoid alcoholic beverages for 24 hours.  ACTIVITY: Your care partner should take you home directly after the procedure.  You should plan to take it easy, moving slowly for the rest of  the day.  You can resume normal activity the day after the procedure however you should NOT DRIVE or use heavy machinery for 24 hours (because of the sedation medicines used during the test).    SYMPTOMS TO REPORT IMMEDIATELY: A gastroenterologist can be reached at any hour.  During normal business hours, 8:30 AM to 5:00 PM Monday through Friday, call [phone].  After hours and on weekends, please call the GI answering service at [phone] who will take a message and have the physician on call contact you.   Following lower endoscopy (colonoscopy or flexible sigmoidoscopy):  Excessive amounts of blood in the stool  Significant tenderness or worsening of abdominal pains  Swelling of the abdomen that is new, acute  Fever of 100F or higher  FOLLOW UP: If any biopsies were taken you will be contacted by phone or by letter within the next 1-3 weeks.  Call your gastroenterologist if you have not heard about the biopsies in 3 weeks.  Our staff will call the home number listed on your records the next business day following your procedure to check on you and address any questions or concerns that you may have at that time regarding the information given to you following your procedure. This is a courtesy call and so if there is no answer at the home number and we have not heard from you through the emergency physician on call, we will assume that you have returned  to your regular daily activities without incident.  SIGNATURES/CONFIDENTIALITY: You and/or your care partner have signed paperwork which will be entered into your electronic medical record.  These signatures attest to the fact that that the information above on your After Visit Summary has been reviewed and is understood.  Full responsibility of the confidentiality of this discharge information lies with you and/or your care-partner.

## 2014-02-27 NOTE — Op Note (Signed)
[facility]  [address] [place], [id number]   COLONOSCOPY PROCEDURE REPORT  PATIENT: Mia Newman, Mia Newman  MR#: [id number] BIRTHDATE: 1946/08/11 , 16  yrs. old GENDER: Female ENDOSCOPIST: Gatha Mayer, MD, [facility] PROCEDURE DATE:  02/27/2014 PROCEDURE:   Colonoscopy with snare polypectomy First Screening Colonoscopy - Avg.  risk and is 50 yrs.  old or older - No.  Prior Negative Screening - Now for repeat screening. Other: See Comments  History of Adenoma - Now for follow-up colonoscopy & has been > or = to 3 yrs.  N/A  Polyps Removed Today? Yes. ASA CLASS:   Class II INDICATIONS:average risk screening.   closer f/u due to deep folds/diverticulosis MEDICATIONS: propofol (Diprivan) 250mg  IV  DESCRIPTION OF PROCEDURE:   After the risks benefits and alternatives of the procedure were thoroughly explained, informed consent was obtained.  A digital rectal exam revealed no abnormalities of the rectum.   The LB [id number]  endoscope was introduced through the anus and advanced to the cecum, which was identified by both the appendix and ileocecal valve. No adverse events experienced.   The quality of the prep was excellent using Suprep  The instrument was then slowly withdrawn as the colon was fully examined.      COLON FINDINGS: A sessile polyp measuring 1 cm in size was found at the cecum.  A polypectomy was performed with a cold snare.  The resection was complete and the polyp tissue was completely retrieved.   Severe diverticulosis was noted throughout the entire examined colon.   The colon mucosa was otherwise normal. Retroflexed views revealed no abnormalities. The time to cecum=5 minutes 46 seconds.  Withdrawal time=8 minutes 57 seconds.  The scope was withdrawn and the procedure completed. COMPLICATIONS: There were no complications.  ENDOSCOPIC IMPRESSION: 1.   Sessile polyp measuring 1 cm in size was found at the cecum; polypectomy was performed  with a cold snare 2.   Severe diverticulosis was noted throughout the entire examined colon worst in left colon 3.   The colon mucosa was otherwise normal - good prep  RECOMMENDATIONS: Timing of repeat colonoscopy will be determined by pathology findings.   eSigned:  Gatha Mayer, MD, [facility] 02/27/2014 3:00 PM   cc: The Patient

## 2014-03-02 ENCOUNTER — Telehealth: Payer: Self-pay | Admitting: *Deleted

## 2014-03-02 NOTE — Telephone Encounter (Signed)
  Follow up Call-  Call back number 02/27/2014  Post procedure Call Back phone  # [phone]  Permission to leave phone message Yes     Patient questions:  Do you have a fever, pain , or abdominal swelling? no Pain Score  0 *  Have you tolerated food without any problems? yes  Have you been able to return to your normal activities? yes  Do you have any questions about your discharge instructions: Diet   no Medications  no Follow up visit  no  Do you have questions or concerns about your Care? no  Actions: * If pain score is 4 or above: No action needed, pain <4.

## 2014-03-03 ENCOUNTER — Encounter: Payer: Self-pay | Admitting: Internal Medicine

## 2014-03-03 DIAGNOSIS — Z860100 Personal history of colon polyps, unspecified: Secondary | ICD-10-CM

## 2014-03-03 HISTORY — DX: Personal history of colonic polyps: Z86.010

## 2014-03-03 HISTORY — DX: Personal history of colon polyps, unspecified: Z86.0100

## 2014-03-16 ENCOUNTER — Telehealth: Payer: Self-pay | Admitting: Gastroenterology

## 2014-03-16 ENCOUNTER — Other Ambulatory Visit: Payer: Self-pay | Admitting: Endocrinology

## 2014-03-16 MED ORDER — METRONIDAZOLE 500 MG PO TABS: 500.0000 mg | ORAL_TABLET | Freq: Two times a day (BID) | ORAL | Status: DC

## 2014-03-16 MED ORDER — HYOSCYAMINE SULFATE 0.125 MG SL SUBL: 0.1250 mg | SUBLINGUAL_TABLET | Freq: Four times a day (QID) | SUBLINGUAL | Status: DC | PRN

## 2014-03-16 MED ORDER — CIPROFLOXACIN HCL 500 MG PO TABS: 500.0000 mg | ORAL_TABLET | Freq: Two times a day (BID) | ORAL | Status: DC

## 2014-03-16 NOTE — Telephone Encounter (Signed)
Pt with LLQ pain since yesterday. Feels she is having recurrent diverticulitis. No fever, change in bowel habits, N/V. Levsin SL is helping but she is running out of this med. Unsure if this is diverticulitis or colon spasm. Will treat for both. Clear liquids for 24 hours then advance diet gradually. Call back if symptoms do not improve. Office follow up with Dr. Carlean Purl

## 2014-04-18 ENCOUNTER — Other Ambulatory Visit: Payer: Self-pay | Admitting: Endocrinology

## 2014-04-20 ENCOUNTER — Other Ambulatory Visit: Payer: Self-pay | Admitting: *Deleted

## 2014-04-20 MED ORDER — LEVOTHYROXINE SODIUM 75 MCG PO TABS: 75.0000 ug | ORAL_TABLET | Freq: Every day | ORAL | Status: DC

## 2014-05-12 DIAGNOSIS — H401290 Low-tension glaucoma, unspecified eye, stage unspecified: Secondary | ICD-10-CM | POA: Diagnosis not present

## 2014-05-15 ENCOUNTER — Telehealth: Payer: Self-pay | Admitting: Endocrinology

## 2014-05-15 MED ORDER — LEVOTHYROXINE SODIUM 75 MCG PO TABS: 75.0000 ug | ORAL_TABLET | Freq: Every day | ORAL | Status: DC

## 2014-05-15 NOTE — Telephone Encounter (Signed)
Patient has an appointment  05/30/14 for med refill levothyroxine 75 mcg, she will be out before her appt, she would like to know if she could a one month prescription before then?? Please advise

## 2014-05-15 NOTE — Telephone Encounter (Signed)
Pt advised that 30 day supply has been sent to hold her over until her appointment.

## 2014-06-09 ENCOUNTER — Ambulatory Visit (INDEPENDENT_AMBULATORY_CARE_PROVIDER_SITE_OTHER): Payer: Medicare Other | Admitting: Endocrinology

## 2014-06-09 ENCOUNTER — Encounter: Payer: Self-pay | Admitting: Endocrinology

## 2014-06-09 VITALS — BP 136/82 | HR 69 | Temp 97.9°F | Ht 61.0 in | Wt 126.0 lb

## 2014-06-09 DIAGNOSIS — N760 Acute vaginitis: Secondary | ICD-10-CM

## 2014-06-09 MED ORDER — LEVOTHYROXINE SODIUM 75 MCG PO TABS: 75.0000 ug | ORAL_TABLET | Freq: Every day | ORAL | Status: DC

## 2014-06-09 NOTE — Progress Notes (Signed)
Subjective:    Patient ID: Mia Newman, female    DOB: 1946-09-05, 68 y.o.   MRN: [id number]  HPI Pt states 5 days of swelling at the vulvar area, but no assoc pain.   Past Medical History  Diagnosis Date  . Unspecified hypothyroidism 04/22/2008  . DM 04/22/2008    borderline  . Pure hypercholesterolemia 09/27/2007  . DIVERTICULITIS-COLON 05/22/2008  . DIVERTICULOSIS, COLON 05/20/2008  . ASYMPTOMATIC POSTMENOPAUSAL STATUS 03/03/2010  . Glaucoma   . Hyperthyroidism   . Irritable bowel syndrome   . Allergy   . Arthritis   . Personal history of colonic polyp - adenoma 03/03/2014    Past Surgical History  Procedure Laterality Date  . Vsd repair  1996  . Abdominal hysterectomy  1987 apx  . Tubal ligation    . 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 Breast surgery    . Colonoscopy      History   Social History  . Marital Status: Married    Spouse Name: N/A    Number of Children: N/A  . Years of Education: N/A   Occupational History  . Retired    Social History Main Topics  . Smoking status: Never Smoker   . Smokeless tobacco: Not on file  . Alcohol Use: No  . Drug Use: No  . Sexual Activity: Yes    Birth Control/ Protection: None   Other Topics Concern  . Not on file   Social History Narrative  . No narrative on file    Current Outpatient Prescriptions on File Prior to Visit  Medication Sig Dispense Refill  . acetaminophen (TYLENOL) 500 MG tablet Take 500 mg by mouth 2 (two) times daily.        . brimonidine (ALPHAGAN) 0.2 % ophthalmic solution Place 1 drop into both eyes 2 (two) times daily.        Marland Kitchen latanoprost (XALATAN) 0.005 % ophthalmic solution Place 1 drop into both eyes at bedtime.        . hyoscyamine (LEVSIN/SL) 0.125 MG SL tablet Place 1 tablet (0.125 mg total) under the tongue every 6 (six) hours as needed.  20 tablet  0   No current facility-administered medications on file prior to visit.    Allergies    Allergen Reactions  . Atorvastatin     REACTION: myalgias  . Raloxifene     REACTION: unspecified  . Simvastatin     REACTION: myalgias (pt perceives due to zocor)  . Sulfamethoxazole     REACTION: unspecified    Family History  Problem Relation Age of Onset  . Heart attack Father 36  . Cancer Maternal Grandmother     Uterine Cancer  . Cancer Paternal Grandmother     Stomach Cancer  . Colon cancer Neg Hx     BP 136/82  Pulse 69  Temp(Src) 97.9 F (36.6 C) (Oral)  Ht 5\' 1"  (1.549 m)  Wt 126 lb (57.153 kg)  BMI 23.82 kg/m2  SpO2 99%   Review of Systems Denies fever and d/c.     Objective:   Physical Exam VITAL SIGNS:  See vs page GENERAL: no distress Ext genitalia: normal to my exam       Assessment & Plan:  Genital problem, uncertain etiology, new.  Patient is advised the following: Patient Instructions  Please see a gynecology specialist.  you will receive a phone call, about a day and time for an appointment. Please  come in for a regular physical after 09/02/14.

## 2014-06-09 NOTE — Patient Instructions (Addendum)
Please see a gynecology specialist.  you will receive a phone call, about a day and time for an appointment. Please come in for a regular physical after 09/02/14.

## 2014-06-10 NOTE — Telephone Encounter (Signed)
Patient stated that she is having swelling in her vagina area ,and need an appointment with a GYN asap

## 2014-06-10 NOTE — Telephone Encounter (Signed)
Called pt and advised that she has been set up to see NP Laurin Coder on 06/12/2014 at 9:00 am.   [facility] stated they needed the note from [place] on 06/10/2014 closed today to fax to GYN office. Thanks!

## 2014-06-10 NOTE — Telephone Encounter (Signed)
Requested call back from [facility] to discuss.

## 2014-06-10 NOTE — Telephone Encounter (Signed)
[facility] to contact GYN to schedule and then call office back to inform.

## 2014-06-12 DIAGNOSIS — N816 Rectocele: Secondary | ICD-10-CM | POA: Diagnosis not present

## 2014-06-17 DIAGNOSIS — N8110 Cystocele, unspecified: Secondary | ICD-10-CM | POA: Diagnosis not present

## 2014-07-27 ENCOUNTER — Encounter: Payer: Self-pay | Admitting: Internal Medicine

## 2014-08-24 DIAGNOSIS — H401232 Low-tension glaucoma, bilateral, moderate stage: Secondary | ICD-10-CM | POA: Diagnosis not present

## 2014-08-26 ENCOUNTER — Encounter: Payer: Self-pay | Admitting: Endocrinology

## 2014-08-26 ENCOUNTER — Ambulatory Visit (INDEPENDENT_AMBULATORY_CARE_PROVIDER_SITE_OTHER): Payer: Medicare Other | Admitting: Endocrinology

## 2014-08-26 VITALS — BP 118/80 | HR 70 | Temp 98.1°F | Ht 61.0 in | Wt 127.0 lb

## 2014-08-26 DIAGNOSIS — D329 Benign neoplasm of meninges, unspecified: Secondary | ICD-10-CM | POA: Diagnosis not present

## 2014-08-26 DIAGNOSIS — E890 Postprocedural hypothyroidism: Secondary | ICD-10-CM

## 2014-08-26 DIAGNOSIS — E039 Hypothyroidism, unspecified: Secondary | ICD-10-CM | POA: Diagnosis not present

## 2014-08-26 DIAGNOSIS — E119 Type 2 diabetes mellitus without complications: Secondary | ICD-10-CM | POA: Diagnosis not present

## 2014-08-26 DIAGNOSIS — E7800 Pure hypercholesterolemia, unspecified: Secondary | ICD-10-CM

## 2014-08-26 DIAGNOSIS — Z0000 Encounter for general adult medical examination without abnormal findings: Secondary | ICD-10-CM

## 2014-08-26 DIAGNOSIS — E785 Hyperlipidemia, unspecified: Secondary | ICD-10-CM | POA: Diagnosis not present

## 2014-08-26 DIAGNOSIS — E875 Hyperkalemia: Secondary | ICD-10-CM | POA: Diagnosis not present

## 2014-08-26 DIAGNOSIS — R9431 Abnormal electrocardiogram [ECG] [EKG]: Secondary | ICD-10-CM

## 2014-08-26 LAB — URINALYSIS, ROUTINE W REFLEX MICROSCOPIC
Bilirubin Urine: NEGATIVE
HGB URINE DIPSTICK: NEGATIVE
Ketones, ur: NEGATIVE
Nitrite: NEGATIVE
RBC / HPF: NONE SEEN (ref 0–?)
SPECIFIC GRAVITY, URINE: 1.01 (ref 1.000–1.030)
TOTAL PROTEIN, URINE-UPE24: NEGATIVE
Urine Glucose: NEGATIVE
Urobilinogen, UA: 0.2 (ref 0.0–1.0)
pH: 7 (ref 5.0–8.0)

## 2014-08-26 LAB — CBC WITH DIFFERENTIAL/PLATELET
BASOS PCT: 0.5 % (ref 0.0–3.0)
Basophils Absolute: 0 10*3/uL (ref 0.0–0.1)
Eosinophils Absolute: 0.1 10*3/uL (ref 0.0–0.7)
Eosinophils Relative: 3.5 % (ref 0.0–5.0)
HCT: 39.1 % (ref 36.0–46.0)
HEMOGLOBIN: 13 g/dL (ref 12.0–15.0)
LYMPHS ABS: 1.7 10*3/uL (ref 0.7–4.0)
Lymphocytes Relative: 40.4 % (ref 12.0–46.0)
MCHC: 33.2 g/dL (ref 30.0–36.0)
MCV: 91.7 fl (ref 78.0–100.0)
MONOS PCT: 8 % (ref 3.0–12.0)
Monocytes Absolute: 0.3 10*3/uL (ref 0.1–1.0)
NEUTROS ABS: 2 10*3/uL (ref 1.4–7.7)
Neutrophils Relative %: 47.6 % (ref 43.0–77.0)
Platelets: 69 10*3/uL — ABNORMAL LOW (ref 150.0–400.0)
RBC: 4.26 Mil/uL (ref 3.87–5.11)
RDW: 14.4 % (ref 11.5–15.5)
WBC: 4.2 10*3/uL (ref 4.0–10.5)

## 2014-08-26 LAB — HEPATIC FUNCTION PANEL
ALK PHOS: 54 U/L (ref 39–117)
ALT: 13 U/L (ref 0–35)
AST: 14 U/L (ref 0–37)
Albumin: 3.3 g/dL — ABNORMAL LOW (ref 3.5–5.2)
Bilirubin, Direct: 0 mg/dL (ref 0.0–0.3)
Total Bilirubin: 0.3 mg/dL (ref 0.2–1.2)
Total Protein: 6.7 g/dL (ref 6.0–8.3)

## 2014-08-26 LAB — BASIC METABOLIC PANEL
BUN: 7 mg/dL (ref 6–23)
CO2: 28 meq/L (ref 19–32)
Calcium: 9.3 mg/dL (ref 8.4–10.5)
Chloride: 105 mEq/L (ref 96–112)
Creatinine, Ser: 0.7 mg/dL (ref 0.4–1.2)
GFR: 90.01 mL/min (ref >60.00)
GLUCOSE: 80 mg/dL (ref 70–99)
POTASSIUM: 3.7 meq/L (ref 3.5–5.1)
Sodium: 141 mEq/L (ref 135–145)

## 2014-08-26 LAB — HEMOGLOBIN A1C: Hgb A1c MFr Bld: 5.9 % (ref 4.6–6.5)

## 2014-08-26 LAB — T4, FREE: Free T4: 1.41 ng/dL (ref 0.60–1.60)

## 2014-08-26 LAB — LIPID PANEL
CHOLESTEROL: 245 mg/dL — AB (ref 0–200)
HDL: 43.3 mg/dL (ref >39.00)
LDL Cholesterol: 173 mg/dL — ABNORMAL HIGH (ref 0–99)
NonHDL: 201.7
TRIGLYCERIDES: 144 mg/dL (ref 0.0–149.0)
Total CHOL/HDL Ratio: 6
VLDL: 28.8 mg/dL (ref 0.0–40.0)

## 2014-08-26 LAB — TSH: TSH: 0.14 u[IU]/mL — ABNORMAL LOW (ref 0.35–4.50)

## 2014-08-26 NOTE — Progress Notes (Signed)
Subjective:    Patient ID: Mia Newman, female    DOB: 1946/04/29, 69 y.o.   MRN: [id number]  HPI Pt states few weeks of slight difficulty with concentration, and assoc insomnia.  She has no edema of the legs.  Pt also returns for f/u of type 2 DM (dx'ed 4235; no known complications; she has never required medication for this).  Past Medical History  Diagnosis Date  . Unspecified hypothyroidism 04/22/2008  . DM 04/22/2008    borderline  . Pure hypercholesterolemia 09/27/2007  . DIVERTICULITIS-COLON 05/22/2008  . DIVERTICULOSIS, COLON 05/20/2008  . ASYMPTOMATIC POSTMENOPAUSAL STATUS 03/03/2010  . Glaucoma   . Hyperthyroidism   . Irritable bowel syndrome   . Allergy   . Arthritis   . Personal history of colonic polyp - adenoma 03/03/2014    Past Surgical History  Procedure Laterality Date  . Vsd repair  1996  . Abdominal hysterectomy  1987 apx  . Tubal ligation    . 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 Breast surgery    . Colonoscopy      History   Social History  . Marital Status: Married    Spouse Name: N/A    Number of Children: N/A  . Years of Education: N/A   Occupational History  . Retired    Social History Main Topics  . Smoking status: Never Smoker   . Smokeless tobacco: Not on file  . Alcohol Use: No  . Drug Use: No  . Sexual Activity: Yes    Birth Control/ Protection: None   Other Topics Concern  . Not on file   Social History Narrative    Current Outpatient Prescriptions on File Prior to Visit  Medication Sig Dispense Refill  . acetaminophen (TYLENOL) 500 MG tablet Take 500 mg by mouth 2 (two) times daily.      . brimonidine (ALPHAGAN) 0.2 % ophthalmic solution Place 1 drop into both eyes 2 (two) times daily.      . hyoscyamine (LEVSIN/SL) 0.125 MG SL tablet Place 1 tablet (0.125 mg total) under the tongue every 6 (six) hours as needed. 20 tablet 0  . latanoprost (XALATAN) 0.005 % ophthalmic solution Place 1  drop into both eyes at bedtime.       No current facility-administered medications on file prior to visit.    Allergies  Allergen Reactions  . Atorvastatin     REACTION: myalgias  . Raloxifene     REACTION: unspecified  . Simvastatin     REACTION: myalgias (pt perceives due to zocor)  . Sulfamethoxazole     REACTION: unspecified    Family History  Problem Relation Age of Onset  . Heart attack Father 19  . Cancer Maternal Grandmother     Uterine Cancer  . Cancer Paternal Grandmother     Stomach Cancer  . Colon cancer Neg Hx     BP 118/80 mmHg  Pulse 70  Temp(Src) 98.1 F (36.7 C) (Oral)  Ht 5\' 1"  (1.549 m)  Wt 127 lb (57.607 kg)  BMI 24.01 kg/m2  SpO2 98%   Review of Systems  Constitutional: Negative for fever.  HENT: Negative for sore throat.   Eyes: Negative for visual disturbance.  Gastrointestinal: Negative for vomiting.  Genitourinary: Negative for frequency.  Musculoskeletal: Positive for back pain.  Skin: Negative for rash.  Neurological: Negative for tremors.   She has anxiety and cold intolerance. She denies chest pain and sob.  Objective:   Physical Exam VITAL SIGNS:  See vs page GENERAL: no distress NECK: There is no palpable thyroid enlargement.  No thyroid nodule is palpable.  No palpable lymphadenopathy at the anterior neck. Pulses: dorsalis pedis intact bilat.   Feet: no deformity.  no edema Skin:  no ulcer on the feet.  normal color and temp. Neuro: sensation is intact to touch on the feet.     i reviewed electrocardiogram.   Lab Results  Component Value Date   WBC 4.2 08/26/2014   HGB 13.0 08/26/2014   HCT 39.1 08/26/2014   PLT 69.0* 08/26/2014   GLUCOSE 80 08/26/2014   CHOL 245* 08/26/2014   TRIG 144.0 08/26/2014   HDL 43.30 08/26/2014   LDLDIRECT 231.1 09/02/2013   LDLCALC 173* 08/26/2014   ALT 13 08/26/2014   AST 14 08/26/2014   NA 141 08/26/2014   K 3.7 08/26/2014   CL 105 08/26/2014   CREATININE 0.7 08/26/2014    BUN 7 08/26/2014   CO2 28 08/26/2014   TSH 0.14* 08/26/2014   HGBA1C 5.9 08/26/2014   MICROALBUR 0.5 09/02/2013      Assessment & Plan:  Abnormal ECG: Pt declines echo DM: well-controlled Dyslipidemia: therapy is limited by multiple perceived drug intolerances Hypothyroidism: slightly overreplaced  Patient is advised the following: Patient Instructions  blood tests are being requested for you today.  We'll contact you with results. Please come in for a "medicare wellness" visit (any day after 09/02/14).     Addendum: work on diet.  i decreased synthroid

## 2014-08-26 NOTE — Patient Instructions (Signed)
blood tests are being requested for you today.  We'll contact you with results. Please come in for a "medicare wellness" visit (any day after 09/02/14).

## 2014-08-27 MED ORDER — LEVOTHYROXINE SODIUM 50 MCG PO TABS: 50.0000 ug | ORAL_TABLET | Freq: Every day | ORAL | Status: DC

## 2014-09-04 ENCOUNTER — Encounter: Payer: Medicare Other | Admitting: Endocrinology

## 2014-11-16 ENCOUNTER — Telehealth: Payer: Self-pay | Admitting: Endocrinology

## 2014-11-16 NOTE — Telephone Encounter (Signed)
Patient states she has lower back pain Started 11/10/14 with lifting   Patient would like to know if she has options either something to be called in or over the counter Should she be seen?  Complains of back spasms with movement    Please advise   Thank you

## 2014-11-16 NOTE — Telephone Encounter (Signed)
Contacted pt and advised to come in for a office visit to discus back pain. For now pt states she going to continue taking ibuprofen, using a heating pad an resting to see if her back gets to feeling any better. Pt states as the weekend approaches if her back does not feel better she will make a office visit to discuss with MD.

## 2014-11-23 DIAGNOSIS — H401232 Low-tension glaucoma, bilateral, moderate stage: Secondary | ICD-10-CM | POA: Diagnosis not present

## 2015-01-26 ENCOUNTER — Other Ambulatory Visit: Payer: Self-pay

## 2015-01-26 DIAGNOSIS — Z1231 Encounter for screening mammogram for malignant neoplasm of breast: Secondary | ICD-10-CM

## 2015-01-29 ENCOUNTER — Ambulatory Visit
Admission: RE | Admit: 2015-01-29 | Discharge: 2015-01-29 | Disposition: A | Discharge status: Home / Self Care | Payer: No Typology Code available for payment source | Source: Ambulatory Visit

## 2015-01-29 DIAGNOSIS — Z1231 Encounter for screening mammogram for malignant neoplasm of breast: Secondary | ICD-10-CM

## 2015-02-22 DIAGNOSIS — H401232 Low-tension glaucoma, bilateral, moderate stage: Secondary | ICD-10-CM | POA: Diagnosis not present

## 2015-05-07 DIAGNOSIS — D225 Melanocytic nevi of trunk: Secondary | ICD-10-CM | POA: Diagnosis not present

## 2015-05-07 DIAGNOSIS — C44311 Basal cell carcinoma of skin of nose: Secondary | ICD-10-CM | POA: Diagnosis not present

## 2015-05-25 DIAGNOSIS — H401232 Low-tension glaucoma, bilateral, moderate stage: Secondary | ICD-10-CM | POA: Diagnosis not present

## 2015-06-08 DIAGNOSIS — Z08 Encounter for follow-up examination after completed treatment for malignant neoplasm: Secondary | ICD-10-CM | POA: Diagnosis not present

## 2015-06-08 DIAGNOSIS — Z85828 Personal history of other malignant neoplasm of skin: Secondary | ICD-10-CM | POA: Diagnosis not present

## 2015-08-23 DIAGNOSIS — H401231 Low-tension glaucoma, bilateral, mild stage: Secondary | ICD-10-CM | POA: Diagnosis not present

## 2015-08-27 ENCOUNTER — Other Ambulatory Visit: Payer: Self-pay | Admitting: Endocrinology

## 2015-09-30 ENCOUNTER — Other Ambulatory Visit: Payer: Self-pay | Admitting: Endocrinology

## 2015-09-30 NOTE — Telephone Encounter (Signed)
Please advise if ok to refill. Last office visit was 11.4.15. Thanks!

## 2015-10-01 ENCOUNTER — Emergency Department (INDEPENDENT_AMBULATORY_CARE_PROVIDER_SITE_OTHER)
Admission: EM | Admit: 2015-10-01 | Discharge: 2015-10-01 | Disposition: A | Discharge status: Home / Self Care | Payer: Medicare Other | Source: Home / Self Care | Attending: Family Medicine | Admitting: Family Medicine

## 2015-10-01 ENCOUNTER — Encounter (HOSPITAL_COMMUNITY): Payer: Self-pay | Admitting: Family Medicine

## 2015-10-01 DIAGNOSIS — J069 Acute upper respiratory infection, unspecified: Secondary | ICD-10-CM | POA: Diagnosis not present

## 2015-10-01 MED ORDER — IPRATROPIUM BROMIDE 0.06 % NA SOLN: 2.0000 | Freq: Four times a day (QID) | NASAL | Status: DC

## 2015-10-01 NOTE — ED Provider Notes (Signed)
CSN: [id number]     Arrival date & time 10/01/15  1356 History   First MD Initiated Contact with Patient 10/01/15 1459     Chief Complaint  Patient presents with  . Facial Pain   (Consider location/radiation/quality/duration/timing/severity/associated sxs/prior Treatment) HPI  Runny nose, throat irritation. Started 6 days ago. Overall unchanged since onset. Subjective fevers and chills initially but no longer. Mild cough. Phlegm production. Denies any facial pain, headache, neck stiffness, chest pain, shortness breath, palpitations, wheezing, abdominal pain, dysuria, frequency. Advil cold and sinus Tylenol with some improvement. No sick contacts.  Past Medical History  Diagnosis Date  . Unspecified hypothyroidism 04/22/2008  . DM 04/22/2008    borderline  . Pure hypercholesterolemia 09/27/2007  . DIVERTICULITIS-COLON 05/22/2008  . DIVERTICULOSIS, COLON 05/20/2008  . ASYMPTOMATIC POSTMENOPAUSAL STATUS 03/03/2010  . Glaucoma   . Hyperthyroidism   . Irritable bowel syndrome   . Allergy   . Arthritis   . Personal history of colonic polyp - adenoma 03/03/2014   Past Surgical History  Procedure Laterality Date  . Vsd repair  1996  . Abdominal hysterectomy  1987 apx  . Tubal ligation    . 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 Breast surgery    . Colonoscopy     Family History  Problem Relation Age of Onset  . Heart attack Father 59  . Cancer Maternal Grandmother     Uterine Cancer  . Cancer Paternal Grandmother     Stomach Cancer  . Colon cancer Neg Hx    Social History  Substance Use Topics  . Smoking status: Never Smoker   . Smokeless tobacco: None  . Alcohol Use: No   OB History    No data available     Review of Systems Per HPI with all other pertinent systems negative.   Allergies  Atorvastatin; Raloxifene; Simvastatin; and Sulfamethoxazole  Home Medications   Prior to Admission medications   Medication Sig Start Date End Date  Taking? Authorizing Provider  acetaminophen (TYLENOL) 500 MG tablet Take 500 mg by mouth 2 (two) times daily.      Historical Provider, MD  brimonidine (ALPHAGAN) 0.2 % ophthalmic solution Place 1 drop into both eyes 2 (two) times daily.      Historical Provider, MD  hyoscyamine (LEVSIN/SL) 0.125 MG SL tablet Place 1 tablet (0.125 mg total) under the tongue every 6 (six) hours as needed. 03/16/14   Ladene Artist, MD  ipratropium (ATROVENT) 0.06 % nasal spray Place 2 sprays into both nostrils 4 (four) times daily. 10/01/15   Waldemar Dickens, MD  latanoprost (XALATAN) 0.005 % ophthalmic solution Place 1 drop into both eyes at bedtime.      Historical Provider, MD  levothyroxine (SYNTHROID, LEVOTHROID) 50 MCG tablet TAKE ONE TABLET BY MOUTH ONCE DAILY BEFORE BREAKFAST 09/30/15   Renato Shin, MD   Meds Ordered and Administered this Visit  Medications - No data to display  BP 168/82 mmHg  Pulse 87  Temp(Src) 98 F (36.7 C) (Oral)  Resp 12  SpO2 99% No data found.   Physical Exam Physical Exam  Constitutional: oriented to person, place, and time. appears well-developed and well-nourished. No distress.  HENT:  Head: Boggy nasal turbinates, pharyngeal cobblestoning without evidence of exudate. No cervical lymphadenopathy. Frontal and maxillary sinuses nontender to palpation Eyes: EOMI. PERRL.  Neck: Normal range of motion.  Cardiovascular: RRR, no m/r/g, 2+ distal pulses,  Pulmonary/Chest: Effort normal and breath sounds  normal. No respiratory distress.  Abdominal: Soft. Bowel sounds are normal. NonTTP, no distension.  Musculoskeletal: Normal range of motion. Non ttp, no effusion.  Neurological: alert and oriented to person, place, and time.  Skin: Skin is warm. No rash noted. non diaphoretic.  Psychiatric: normal mood and affect. behavior is normal. Judgment and thought content normal.   ED Course  Procedures (including critical care time)  Labs Review Labs Reviewed - No data to  display  Imaging Review No results found.   Visual Acuity Review  Right Eye Distance:   Left Eye Distance:   Bilateral Distance:    Right Eye Near:   Left Eye Near:    Bilateral Near:         MDM   1. Viral URI    Viral URI. Likely start improving in the near future. At nasal Atrovent, Mucinex, NSAIDs.  Continue anti-hypertensive medication as prescribed. Elevation today likely secondary to acute illness.    Waldemar Dickens, MD 10/01/15 [phone]

## 2015-10-01 NOTE — Discharge Instructions (Signed)
Her symptoms are likely due to a viral illness. This should begin to resolve within the next 24-72 hours. Please start the nasal Atrovent to help with nasal congestion and runny nose. Please also consider using 600 mg of regular ibuprofen or Advil 2-3 times a day. This should help significantly with your overall symptoms. Please also start using Mucinex help her Nasal secretions. Please get plenty of rest and stay well-hydrated.

## 2015-10-01 NOTE — ED Notes (Signed)
Pt stated that she is having throat irritation and cough.  Chest pain from congestion and green colored mucus from nose, along with headaches Pt alert and oriented

## 2015-10-05 NOTE — ED Notes (Signed)
Patient called, c/o continued facial pain, HA, green nasal secretions. Discussed w Dr Maryruth Eve, who authorized Z-Pack., x 1 fill, NR, generic combination okay.  Discussed w  patient, and have called to [facility], [place] at patient request. Spoke directly w pharmacist

## 2015-10-28 ENCOUNTER — Other Ambulatory Visit: Payer: Self-pay | Admitting: Endocrinology

## 2015-10-29 ENCOUNTER — Ambulatory Visit (INDEPENDENT_AMBULATORY_CARE_PROVIDER_SITE_OTHER): Payer: Medicare Other | Admitting: Endocrinology

## 2015-10-29 ENCOUNTER — Encounter: Payer: Self-pay | Admitting: Endocrinology

## 2015-10-29 VITALS — BP 127/87 | HR 77 | Temp 98.7°F | Ht 61.0 in | Wt 137.0 lb

## 2015-10-29 DIAGNOSIS — E7800 Pure hypercholesterolemia, unspecified: Secondary | ICD-10-CM | POA: Diagnosis not present

## 2015-10-29 DIAGNOSIS — Z780 Asymptomatic menopausal state: Secondary | ICD-10-CM

## 2015-10-29 DIAGNOSIS — E890 Postprocedural hypothyroidism: Secondary | ICD-10-CM

## 2015-10-29 DIAGNOSIS — E119 Type 2 diabetes mellitus without complications: Secondary | ICD-10-CM

## 2015-10-29 LAB — URINALYSIS, ROUTINE W REFLEX MICROSCOPIC
BILIRUBIN URINE: NEGATIVE
Hgb urine dipstick: NEGATIVE
KETONES UR: NEGATIVE
LEUKOCYTES UA: NEGATIVE
Nitrite: NEGATIVE
Total Protein, Urine: NEGATIVE
UROBILINOGEN UA: 0.2 (ref 0.0–1.0)
Urine Glucose: NEGATIVE
pH: 6 (ref 5.0–8.0)

## 2015-10-29 LAB — HEPATIC FUNCTION PANEL
ALT: 17 U/L (ref 0–35)
AST: 18 U/L (ref 0–37)
Albumin: 4.4 g/dL (ref 3.5–5.2)
Alkaline Phosphatase: 52 U/L (ref 39–117)
BILIRUBIN TOTAL: 0.4 mg/dL (ref 0.2–1.2)
Bilirubin, Direct: 0 mg/dL (ref 0.0–0.3)
Total Protein: 6.9 g/dL (ref 6.0–8.3)

## 2015-10-29 LAB — CBC WITH DIFFERENTIAL/PLATELET
BASOS PCT: 0.7 % (ref 0.0–3.0)
Basophils Absolute: 0 10*3/uL (ref 0.0–0.1)
EOS ABS: 0.2 10*3/uL (ref 0.0–0.7)
Eosinophils Relative: 4 % (ref 0.0–5.0)
HEMATOCRIT: 44.3 % (ref 36.0–46.0)
HEMOGLOBIN: 14.6 g/dL (ref 12.0–15.0)
LYMPHS PCT: 33.9 % (ref 12.0–46.0)
Lymphs Abs: 1.9 10*3/uL (ref 0.7–4.0)
MCHC: 32.8 g/dL (ref 30.0–36.0)
MCV: 93.1 fl (ref 78.0–100.0)
MONOS PCT: 7.4 % (ref 3.0–12.0)
Monocytes Absolute: 0.4 10*3/uL (ref 0.1–1.0)
Neutro Abs: 3.1 10*3/uL (ref 1.4–7.7)
Neutrophils Relative %: 54 % (ref 43.0–77.0)
Platelets: 275 10*3/uL (ref 150.0–400.0)
RBC: 4.76 Mil/uL (ref 3.87–5.11)
RDW: 14.7 % (ref 11.5–15.5)
WBC: 5.7 10*3/uL (ref 4.0–10.5)

## 2015-10-29 LAB — LIPID PANEL
CHOLESTEROL: 318 mg/dL — AB (ref 0–200)
HDL: 46.1 mg/dL (ref >39.00)
NonHDL: 271.59
TRIGLYCERIDES: 296 mg/dL — AB (ref 0.0–149.0)
Total CHOL/HDL Ratio: 7
VLDL: 59.2 mg/dL — ABNORMAL HIGH (ref 0.0–40.0)

## 2015-10-29 LAB — MICROALBUMIN / CREATININE URINE RATIO
Creatinine,U: 37.4 mg/dL
MICROALB/CREAT RATIO: 1.9 mg/g (ref 0.0–30.0)
Microalb, Ur: <0.7 mg/dL (ref 0.0–1.9)

## 2015-10-29 LAB — BASIC METABOLIC PANEL
BUN: 7 mg/dL (ref 6–23)
CO2: 32 meq/L (ref 19–32)
Calcium: 9.7 mg/dL (ref 8.4–10.5)
Chloride: 104 mEq/L (ref 96–112)
Creatinine, Ser: 0.7 mg/dL (ref 0.40–1.20)
GFR: 88.22 mL/min (ref >60.00)
Glucose, Bld: 88 mg/dL (ref 70–99)
POTASSIUM: 4.2 meq/L (ref 3.5–5.1)
SODIUM: 142 meq/L (ref 135–145)

## 2015-10-29 LAB — LDL CHOLESTEROL, DIRECT: Direct LDL: 208 mg/dL

## 2015-10-29 LAB — TSH: TSH: 17.52 u[IU]/mL — ABNORMAL HIGH (ref 0.35–4.50)

## 2015-10-29 MED ORDER — LEVOTHYROXINE SODIUM 50 MCG PO TABS: ORAL_TABLET | ORAL | Status: DC

## 2015-10-29 MED ORDER — CYCLOBENZAPRINE HCL 10 MG PO TABS: 10.0000 mg | ORAL_TABLET | Freq: Three times a day (TID) | ORAL | Status: DC | PRN

## 2015-10-29 NOTE — Progress Notes (Signed)
Subjective:    Patient ID: Mia Newman, female    DOB: 22-Nov-1945, 70 y.o.   MRN: [id number]  HPI  The state of at least three ongoing medical problems is addressed today, with interval history of each noted here: Dyslipidemia: she reports myalgias with statins: she denies chest pain Post-I-131 hypothyroidism: she denies weight change DM: she denies sob Past Medical History  Diagnosis Date  . Unspecified hypothyroidism 04/22/2008  . DM 04/22/2008    borderline  . Pure hypercholesterolemia 09/27/2007  . DIVERTICULITIS-COLON 05/22/2008  . DIVERTICULOSIS, COLON 05/20/2008  . ASYMPTOMATIC POSTMENOPAUSAL STATUS 03/03/2010  . Glaucoma   . Hyperthyroidism   . Irritable bowel syndrome   . Allergy   . Arthritis   . Personal history of colonic polyp - adenoma 03/03/2014    Past Surgical History  Procedure Laterality Date  . Vsd repair  1996  . Abdominal hysterectomy  1987 apx  . Tubal ligation    . 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 Breast surgery    . Colonoscopy      Social History   Social History  . Marital Status: Married    Spouse Name: N/A  . Number of Children: N/A  . Years of Education: N/A   Occupational History  . Retired    Social History Main Topics  . Smoking status: Never Smoker   . Smokeless tobacco: Not on file  . Alcohol Use: No  . Drug Use: No  . Sexual Activity: Yes    Birth Control/ Protection: None   Other Topics Concern  . Not on file   Social History Narrative    Current Outpatient Prescriptions on File Prior to Visit  Medication Sig Dispense Refill  . acetaminophen (TYLENOL) 500 MG tablet Take 500 mg by mouth 2 (two) times daily.      . brimonidine (ALPHAGAN) 0.2 % ophthalmic solution Place 1 drop into both eyes 2 (two) times daily.      Marland Kitchen ipratropium (ATROVENT) 0.06 % nasal spray Place 2 sprays into both nostrils 4 (four) times daily. 15 mL 12  . latanoprost (XALATAN) 0.005 % ophthalmic solution Place  1 drop into both eyes at bedtime.       No current facility-administered medications on file prior to visit.    Allergies  Allergen Reactions  . Atorvastatin     REACTION: myalgias  . Raloxifene     REACTION: unspecified  . Simvastatin     REACTION: myalgias (pt perceives due to zocor)  . Sulfamethoxazole     REACTION: unspecified    Family History  Problem Relation Age of Onset  . Heart attack Father 21  . Cancer Maternal Grandmother     Uterine Cancer  . Cancer Paternal Grandmother     Stomach Cancer  . Colon cancer Neg Hx     BP 127/87 mmHg  Pulse 77  Temp(Src) 98.7 F (37.1 C) (Oral)  Ht 5\' 1"  (1.549 m)  Wt 137 lb (62.143 kg)  BMI 25.90 kg/m2  SpO2 97%     Review of Systems Denies edema and constipation.  She has intermittent cramps at the lower back.     Objective:   Physical Exam VITAL SIGNS:  See vs page GENERAL: no distress NECK: There is no palpable thyroid enlargement.  No thyroid nodule is palpable.  No palpable lymphadenopathy at the anterior neck. LUNGS:  Clear to auscultation HEART:  Regular rate and rhythm without murmurs noted.  Normal S1,S2.   Pulses: dorsalis pedis intact bilat.   MSK: no deformity of the feet.  CV: no leg edema.   Skin:  no ulcer on the feet.  normal color and temp on the feet.  Neuro: sensation is intact to touch on the feet.     Lab Results  Component Value Date   CHOL 318* 10/29/2015   HDL 46.10 10/29/2015   LDLCALC 173* 08/26/2014   LDLDIRECT 208.0 10/29/2015   TRIG 296.0* 10/29/2015   CHOLHDL 7 10/29/2015   Lab Results  Component Value Date   TSH 17.52* 10/29/2015      Assessment & Plan:  Back spasms: I rx'ed flexeril Hypothyroidism: she needs increased rx.  i have sent a prescription to your pharmacy, to increase synthroid.  Recheck TSH in 1 month Dyslipidemia: therapy is limited by perceived drug intolerance. I advised diet DM: well-controlled.  Continue diet.     Subjective:   Patient here for  Medicare annual wellness visit and management of other chronic and acute problems.     Risk factors: advanced age    47 of Physicians Providing Medical Care to Patient:  See "snapshot"   Activities of Daily Living: In your present state of health, do you have any difficulty performing the following activities?:  Preparing food and eating?: No  Bathing yourself: No  Getting dressed: No  Using the toilet:No  Moving around from place to place: No  In the past year have you fallen or had a near fall?:No    Home Safety: Has smoke detector and wears seat belts. Firearms are safely stored. No excess sun exposure.  Diet and Exercise  Current exercise habits: pt says good Dietary issues discussed: pt reports a healthy diet   Depression Screen  Q1: Over the past two weeks, have you felt down, depressed or hopeless?no  Q2: Over the past two weeks, have you felt little interest or pleasure in doing things? no   The following portions of the patient's history were reviewed and updated as appropriate: allergies, current medications, past family history, past medical history, past social history, past surgical history and problem list.   Review of Systems  Denies hearing loss, and visual loss Objective:   Vision:  Sees opthalmologist Hearing: grossly normal Body mass index:  See vs page Msk: pt easily and quickly performs "get-up-and-go" from a sitting position Cognitive Impairment Assessment: cognition, memory and judgment appear normal.  remembers 3/3 at 5 minutes.  excellent recall.  can easily read and write a sentence.  alert and oriented x 3.    Assessment:   Medicare wellness utd on preventive parameters    Plan:   During the course of the visit the patient was educated and counseled about appropriate screening and preventive services including:       Fall prevention   Screening mammography  Bone densitometry screening  Diabetes screening  Nutrition counseling    Vaccines Vaccines are declined today  Patient Instructions (the written plan) was given to the patient.

## 2015-10-29 NOTE — Patient Instructions (Addendum)
blood tests are requested for you today.  We'll let you know about the results. good diet and exercise significantly improve your health.  please let me know if you wish to be referred to a dietician.  high blood sugar is very risky to your health.  you should see an eye doctor and dentist every year.  It is very important to get all recommended vaccinations.  please consider these measures for your health:  minimize alcohol.  do not use tobacco products.  have a colonoscopy at least every 10 years from age 64.  Women should have an annual mammogram from age 81.  keep firearms safely stored.  always use seat belts.  have working smoke alarms in your home.  see an eye doctor and dentist regularly.  never drive under the influence of alcohol or drugs (including prescription drugs).  those with fair skin should take precautions against the sun.   please let me know what your wishes would be, if artificial life support measures should become necessary.  it is critically important to prevent falling down (keep floor areas well-lit, dry, and free of loose objects.  If you have a cane, walker, or wheelchair, you should use it, even for short trips around the house.  Also, try not to rush).  Please return in 1 year.

## 2015-10-30 MED ORDER — LEVOTHYROXINE SODIUM 100 MCG PO TABS: 100.0000 ug | ORAL_TABLET | Freq: Every day | ORAL | Status: DC

## 2015-11-03 NOTE — Progress Notes (Signed)
   Subjective:    Patient ID: Mia Newman, female    DOB: 1946/03/03, 70 y.o.   MRN: [id number]  HPI    Review of Systems     Objective:   Physical Exam    i personally reviewed electrocardiogram tracing (today): Indication: dyslipidemia Impression: normal    Assessment & Plan:

## 2015-11-16 ENCOUNTER — Other Ambulatory Visit: Payer: Medicare Other

## 2015-11-24 DIAGNOSIS — H401232 Low-tension glaucoma, bilateral, moderate stage: Secondary | ICD-10-CM | POA: Diagnosis not present

## 2015-12-01 ENCOUNTER — Other Ambulatory Visit: Payer: Self-pay | Admitting: Endocrinology

## 2015-12-01 ENCOUNTER — Other Ambulatory Visit (INDEPENDENT_AMBULATORY_CARE_PROVIDER_SITE_OTHER): Payer: Medicare Other

## 2015-12-01 DIAGNOSIS — E119 Type 2 diabetes mellitus without complications: Secondary | ICD-10-CM | POA: Diagnosis not present

## 2015-12-01 DIAGNOSIS — E890 Postprocedural hypothyroidism: Secondary | ICD-10-CM

## 2015-12-01 DIAGNOSIS — Z780 Asymptomatic menopausal state: Secondary | ICD-10-CM

## 2015-12-01 DIAGNOSIS — E7800 Pure hypercholesterolemia, unspecified: Secondary | ICD-10-CM | POA: Diagnosis not present

## 2015-12-01 LAB — TSH: TSH: 0.18 u[IU]/mL — ABNORMAL LOW (ref 0.35–4.50)

## 2015-12-01 MED ORDER — LEVOTHYROXINE SODIUM 75 MCG PO TABS: 75.0000 ug | ORAL_TABLET | Freq: Every day | ORAL | Status: DC

## 2015-12-07 DIAGNOSIS — L821 Other seborrheic keratosis: Secondary | ICD-10-CM | POA: Diagnosis not present

## 2015-12-07 DIAGNOSIS — Z08 Encounter for follow-up examination after completed treatment for malignant neoplasm: Secondary | ICD-10-CM | POA: Diagnosis not present

## 2015-12-07 DIAGNOSIS — Z85828 Personal history of other malignant neoplasm of skin: Secondary | ICD-10-CM | POA: Diagnosis not present

## 2016-02-28 DIAGNOSIS — H401232 Low-tension glaucoma, bilateral, moderate stage: Secondary | ICD-10-CM | POA: Diagnosis not present

## 2016-02-28 DIAGNOSIS — E119 Type 2 diabetes mellitus without complications: Secondary | ICD-10-CM | POA: Diagnosis not present

## 2016-02-28 LAB — HM DIABETES EYE EXAM

## 2016-02-29 ENCOUNTER — Encounter: Payer: Self-pay | Admitting: Endocrinology

## 2016-03-07 ENCOUNTER — Telehealth: Payer: Self-pay | Admitting: Internal Medicine

## 2016-03-07 MED ORDER — AMOXICILLIN-POT CLAVULANATE 875-125 MG PO TABS: 1.0000 | ORAL_TABLET | Freq: Two times a day (BID) | ORAL | Status: DC

## 2016-03-07 NOTE — Telephone Encounter (Signed)
Pt aware, script sent to pharmacy, pt scheduled to see Dr. Carlean Purl 03/13/16@3 :15pm.

## 2016-03-07 NOTE — Telephone Encounter (Signed)
Pt states she thinks she is having a diverticulitis flare. Reports she started having LLQ abd pain 3-4 days ago. Discussed with pt that there are no available appts, pt wanted to see what Dr. Carlean Purl would recommend. Please advise.

## 2016-03-07 NOTE — Telephone Encounter (Signed)
augmentin generic 875 mg bid x 10d Low residue diet See me next week sooner if not improving (could also see PA)

## 2016-03-13 ENCOUNTER — Ambulatory Visit (INDEPENDENT_AMBULATORY_CARE_PROVIDER_SITE_OTHER): Payer: Medicare Other | Admitting: Internal Medicine

## 2016-03-13 ENCOUNTER — Encounter: Payer: Self-pay | Admitting: Internal Medicine

## 2016-03-13 VITALS — BP 124/68 | HR 66 | Ht 61.0 in | Wt 132.0 lb

## 2016-03-13 DIAGNOSIS — K5732 Diverticulitis of large intestine without perforation or abscess without bleeding: Secondary | ICD-10-CM

## 2016-03-13 NOTE — Progress Notes (Signed)
   Subjective:    Patient ID: Mia Newman, female    DOB: October 22, 1946, 70 y.o.   MRN: [id number] CC: Follow-up of diverticulitis HPI Patient is here reporting over the past couple of days since starting Augmentin she is improved and actually not having pain any more. She called the office last week and said she was having her typical left lower quadrant pain and mild constipation consistent with previous episodes of diverticulitis, about 5 or 6 times in the past. This has been proven on prior CT. Not having any problems now. Medications, allergies, past medical history, past surgical history, family history and social history are reviewed and updated in the EMR.   Review of Systems As above feels well otherwise    Objective:   Physical Exam BP 124/68 mmHg  Pulse 66  Ht 5\' 1"  (1.549 m)  Wt 132 lb (59.875 kg)  BMI 24.95 kg/m2 Anicteric No acute distress Abdomen is soft nontender no organomegaly or mass     Assessment & Plan:   1. Diverticulitis large intestine w/o perforation or abscess w/o bleeding    This is the first episode in about 14 months. He says she's had 5 or 6 in her lifetime. She is responding to the antibiotic she'll complete her course and let me know she has further problems. He is up-to-date with colon cancer screening/polyp surveillance with last colonoscopy in 2015. Small adenoma removed.

## 2016-03-13 NOTE — Patient Instructions (Signed)
    Glad you are doing better.  Let me know if you have more trouble (I hope not!)  After the antibiotics are finished you may gradually resume your normal diet.  I appreciate the opportunity to care for you. Gatha Mayer, MD, [facility]

## 2016-03-22 DIAGNOSIS — N813 Complete uterovaginal prolapse: Secondary | ICD-10-CM | POA: Diagnosis not present

## 2016-03-23 DIAGNOSIS — N813 Complete uterovaginal prolapse: Secondary | ICD-10-CM | POA: Diagnosis not present

## 2016-04-03 DIAGNOSIS — N813 Complete uterovaginal prolapse: Secondary | ICD-10-CM | POA: Diagnosis not present

## 2016-05-30 DIAGNOSIS — H401232 Low-tension glaucoma, bilateral, moderate stage: Secondary | ICD-10-CM | POA: Diagnosis not present

## 2016-06-22 DIAGNOSIS — N813 Complete uterovaginal prolapse: Secondary | ICD-10-CM | POA: Diagnosis not present

## 2016-09-04 DIAGNOSIS — H401232 Low-tension glaucoma, bilateral, moderate stage: Secondary | ICD-10-CM | POA: Diagnosis not present

## 2016-10-27 ENCOUNTER — Ambulatory Visit: Payer: Medicare Other | Admitting: Endocrinology

## 2016-12-04 DIAGNOSIS — H401232 Low-tension glaucoma, bilateral, moderate stage: Secondary | ICD-10-CM | POA: Diagnosis not present

## 2016-12-07 DIAGNOSIS — H1013 Acute atopic conjunctivitis, bilateral: Secondary | ICD-10-CM | POA: Diagnosis not present

## 2016-12-14 DIAGNOSIS — H1012 Acute atopic conjunctivitis, left eye: Secondary | ICD-10-CM | POA: Diagnosis not present

## 2016-12-18 ENCOUNTER — Other Ambulatory Visit: Payer: Self-pay

## 2016-12-18 MED ORDER — LEVOTHYROXINE SODIUM 75 MCG PO TABS: 75.0000 ug | ORAL_TABLET | Freq: Every day | ORAL | 11 refills | Status: DC

## 2017-01-25 ENCOUNTER — Telehealth: Payer: Self-pay | Admitting: Endocrinology

## 2017-01-25 NOTE — Telephone Encounter (Signed)
Patient stated she washed her face tue night and discovered rash and it has been spreading all over ever since.  She is going to to urgent care

## 2017-01-25 NOTE — Telephone Encounter (Signed)
Noted  

## 2017-01-27 ENCOUNTER — Encounter (HOSPITAL_COMMUNITY): Payer: Self-pay | Admitting: Emergency Medicine

## 2017-01-27 ENCOUNTER — Ambulatory Visit (HOSPITAL_COMMUNITY)
Admission: EM | Admit: 2017-01-27 | Discharge: 2017-01-27 | Disposition: A | Discharge status: Home / Self Care | Payer: Medicare Other | Attending: Internal Medicine | Admitting: Internal Medicine

## 2017-01-27 DIAGNOSIS — L237 Allergic contact dermatitis due to plants, except food: Secondary | ICD-10-CM

## 2017-01-27 MED ORDER — MUPIROCIN 2 % EX OINT: 1.0000 "application " | TOPICAL_OINTMENT | Freq: Two times a day (BID) | CUTANEOUS | 0 refills | Status: DC

## 2017-01-27 MED ORDER — PREDNISONE 20 MG PO TABS: 20.0000 mg | ORAL_TABLET | Freq: Every day | ORAL | 0 refills | Status: DC

## 2017-01-27 NOTE — Discharge Instructions (Addendum)
Rash is consistent with a plant reaction.  If it gets sore/seepy or you have a new fever >100.5, please have rechecked at urgent care or with your primary care provider.  Prescription for prednisone and for an antibiotic ointment was sent to the [facility] on [address].

## 2017-01-27 NOTE — ED Triage Notes (Signed)
Rash started on Tuesday.  Rash to chin/neck, bilateral arms and bilateral thighs.  Patient did pull weeds last week end.

## 2017-01-27 NOTE — ED Provider Notes (Signed)
[place]    CSN: [id number] Arrival date & time: 01/27/17  1409     History   Chief Complaint Chief Complaint  Patient presents with  . Rash    HPI Mia Newman is a 71 y.o. female. She has a history of an intense reaction to poison ivy. Worked in the yard briefly several days ago, and has had itchy blistery patches popping up on her arms, thighs, neck, ever since.  Not sore, no crusting. No fever, no malaise.    HPI  Past Medical History:  Diagnosis Date  . Allergy   . Arthritis   . ASYMPTOMATIC POSTMENOPAUSAL STATUS 03/03/2010  . DIVERTICULITIS-COLON 05/22/2008  . DIVERTICULOSIS, COLON 05/20/2008  . DM 04/22/2008   borderline  . Glaucoma   . Hyperthyroidism   . Irritable bowel syndrome   . Personal history of colonic polyp - adenoma 03/03/2014  . Pure hypercholesterolemia 09/27/2007  . Unspecified hypothyroidism 04/22/2008    Patient Active Problem List   Diagnosis Date Noted  . Vaginitis and vulvovaginitis 06/09/2014  . Personal history of colonic polyp - adenoma 03/03/2014  . History of diverticulitis 11/21/2013  . Hyperkalemia 09/03/2013  . Meningioma ([place]) 09/02/2013  . 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 Encounter for long-term (current) use of other medications 01/04/2012  . Asymptomatic postmenopausal status 03/03/2010  . Diabetes ([place]) 04/22/2008  . PURE HYPERCHOLESTEROLEMIA 09/27/2007    Past Surgical History:  Procedure Laterality Date  . ABDOMINAL HYSTERECTOMY  1987 apx  . 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 BREAST SURGERY    . COLONOSCOPY    . TUBAL LIGATION    . VSD [place] Medications    Prior to Admission medications   Medication Sig Start Date End Date Taking? Authorizing Provider  acetaminophen (TYLENOL) 500 MG tablet Take 500 mg by mouth 2 (two) times daily.     Yes Historical Provider, MD  latanoprost (XALATAN) 0.005 % ophthalmic solution Place  1 drop into both eyes at bedtime.     Yes Historical Provider, MD  levothyroxine (SYNTHROID, LEVOTHROID) 75 MCG tablet Take 1 tablet (75 mcg total) by mouth daily before breakfast. 12/18/16  Yes Renato Shin, MD  brimonidine (ALPHAGAN) 0.2 % ophthalmic solution Place 1 drop into both eyes 2 (two) times daily.      Historical Provider, MD  cyclobenzaprine (FLEXERIL) 10 MG tablet Take 1 tablet (10 mg total) by mouth 3 (three) times daily as needed for muscle spasms. 10/29/15   Renato Shin, MD  mupirocin ointment (BACTROBAN) 2 % Apply 1 application topically 2 (two) times daily. 01/27/17   Sherlene Shams, MD  predniSONE (DELTASONE) 20 MG tablet Take 1 tablet (20 mg total) by mouth daily. 3 tabs qd x 3d then 2 tabs qd x 3d then 1 tab qd x 3d then 0.5 tab qd x 4d then stop. 01/27/17   Sherlene Shams, MD    Family History Family History  Problem Relation Age of Onset  . Heart attack Father 88  . Cancer Maternal Grandmother     Uterine Cancer  . Cancer Paternal Grandmother     Stomach Cancer  . Colon cancer Neg Hx     Social History Social History  Substance Use Topics  . Smoking status: Never Smoker  . Smokeless tobacco: Not on file  . Alcohol use No     Allergies   Atorvastatin; Raloxifene; Simvastatin; and  Sulfamethoxazole   Review of Systems Review of Systems  All other systems reviewed and are negative.    Physical Exam Triage Vital Signs ED Triage Vitals  Enc Vitals Group     BP 01/27/17 1502 (!) 165/80     Pulse Rate 01/27/17 1502 69     Resp 01/27/17 1502 18     Temp 01/27/17 1502 98.6 F (37 C)     Temp Source 01/27/17 1502 Oral     SpO2 01/27/17 1502 100 %     Weight --      Height --      Pain Score 01/27/17 1459 3     Pain Loc --    Updated Vital Signs BP (!) 165/80 (BP Location: Left Arm)   Pulse 69   Temp 98.6 F (37 C) (Oral)   Resp 18   SpO2 100%  \ Physical Exam  Constitutional: She is oriented to person, place, and time. No distress.  HENT:  Head:  Atraumatic.  Eyes:  Conjugate gaze observed, no eye redness/discharge  Neck: Neck supple.  Cardiovascular: Normal rate.   Pulmonary/Chest: No respiratory distress.  Abdominal: She exhibits no distension.  Musculoskeletal: Normal range of motion.  Neurological: She is alert and oriented to person, place, and time.  Skin: Skin is warm and dry.  Erythematous vesicular patches in the flexor surface of the right elbow, on both forearms, bilateral inner thighs, left anterior and lateral neck, left chin.  Nursing note and vitals reviewed.    UC Treatments / Results   Procedures Procedures (including critical care time) None today  Final Clinical Impressions(s) / UC Diagnoses   Final diagnoses:  Plant allergic contact dermatitis   Rash is consistent with a plant reaction.  If it gets sore/seepy or you have a new fever >100.5, please have rechecked at urgent care or with your primary care provider.  Prescription for prednisone and for an antibiotic ointment was sent to the [facility] on [address].    New Prescriptions New Prescriptions   MUPIROCIN OINTMENT (BACTROBAN) 2 %    Apply 1 application topically 2 (two) times daily.   PREDNISONE (DELTASONE) 20 MG TABLET    Take 1 tablet (20 mg total) by mouth daily. 3 tabs qd x 3d then 2 tabs qd x 3d then 1 tab qd x 3d then 0.5 tab qd x 4d then stop.     Sherlene Shams, MD 01/28/17 [phone]

## 2017-03-12 DIAGNOSIS — H401232 Low-tension glaucoma, bilateral, moderate stage: Secondary | ICD-10-CM | POA: Diagnosis not present

## 2017-04-21 ENCOUNTER — Encounter (HOSPITAL_COMMUNITY): Payer: Self-pay | Admitting: Emergency Medicine

## 2017-04-21 ENCOUNTER — Ambulatory Visit (HOSPITAL_COMMUNITY)
Admission: EM | Admit: 2017-04-21 | Discharge: 2017-04-21 | Disposition: A | Discharge status: Home / Self Care | Payer: Medicare Other | Attending: Family Medicine | Admitting: Family Medicine

## 2017-04-21 DIAGNOSIS — M7552 Bursitis of left shoulder: Secondary | ICD-10-CM

## 2017-04-21 MED ORDER — PREDNISONE 10 MG (21) PO TBPK: ORAL_TABLET | Freq: Every day | ORAL | 0 refills | Status: DC

## 2017-04-21 NOTE — Discharge Instructions (Signed)
You most likely have bursitis of your shoulder. I prescribed 12 day taper of prednisone, take as directed. You may also take over-the-counter Tylenol with this as well. I recommend rest, ice as needed onto the shoulder. If your symptoms persist, follow-up with your primary care provider, or follow-up with an orthopedist, as you may need further evaluation of your injury.

## 2017-04-21 NOTE — ED Provider Notes (Signed)
CSN: [id number]     Arrival date & time 04/21/17  1427 History   First MD Initiated Contact with Patient 04/21/17 [phone]     Chief Complaint  Patient presents with  . Shoulder Pain   (Consider location/radiation/quality/duration/timing/severity/associated sxs/prior Treatment) NEIDY GUERRIERI is a 71 y.o. female with a past history of arthritis, diverticulitis, DM, and hyperthyroidism along with other chronic histories, who presents to the [facility] urgent care with a chief complaint of shoulder pain for one month. She denies any traumatic cause, states it started after doing yard work, specifically raking leaves. She has tried tylenol without relief.     The history is provided by the patient.  Shoulder Pain    Past Medical History:  Diagnosis Date  . Allergy   . Arthritis   . ASYMPTOMATIC POSTMENOPAUSAL STATUS 03/03/2010  . DIVERTICULITIS-COLON 05/22/2008  . DIVERTICULOSIS, COLON 05/20/2008  . DM 04/22/2008   borderline  . Glaucoma   . Hyperthyroidism   . Irritable bowel syndrome   . Personal history of colonic polyp - adenoma 03/03/2014  . Pure hypercholesterolemia 09/27/2007  . Unspecified hypothyroidism 04/22/2008   Past Surgical History:  Procedure Laterality Date  . ABDOMINAL HYSTERECTOMY  1987 apx  . 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 BREAST SURGERY    . COLONOSCOPY    . TUBAL LIGATION    . VSD REPAIR  1996   Family History  Problem Relation Age of Onset  . Heart attack Father 25  . Cancer Maternal Grandmother        Uterine Cancer  . Cancer Paternal Grandmother        Stomach Cancer  . Colon cancer Neg Hx    Social History  Substance Use Topics  . Smoking status: Never Smoker  . Smokeless tobacco: Never Used  . Alcohol use No   OB History    No data available     Review of Systems  Constitutional: Negative.   HENT: Negative.   Respiratory: Negative.   Cardiovascular: Negative.   Gastrointestinal: Negative.    Musculoskeletal:       Left shoulder pain  Neurological: Negative.     Allergies  Atorvastatin; Raloxifene; Simvastatin; and Sulfamethoxazole  Home Medications   Prior to Admission medications   Medication Sig Start Date End Date Taking? Authorizing Provider  acetaminophen (TYLENOL) 500 MG tablet Take 500 mg by mouth 2 (two) times daily.     Yes [provider]  latanoprost (XALATAN) 0.005 % ophthalmic solution Place 1 drop into both eyes at bedtime.     Yes [provider]  brimonidine (ALPHAGAN) 0.2 % ophthalmic solution Place 1 drop into both eyes 2 (two) times daily.      [provider]  cyclobenzaprine (FLEXERIL) 10 MG tablet Take 1 tablet (10 mg total) by mouth 3 (three) times daily as needed for muscle spasms. 10/29/15   Renato Shin, MD  mupirocin ointment (BACTROBAN) 2 % Apply 1 application topically 2 (two) times daily. 01/27/17   Sherlene Shams, MD  predniSONE (STERAPRED UNI-PAK 21 TAB) 10 MG (21) TBPK tablet Take by mouth daily. Take 6 tabs by mouth daily  for 2 days, then 5 tabs for 2 days, then 4 tabs for 2 days, then 3 tabs for 2 days, 2 tabs for 2 days, then 1 tab by mouth daily for 2 days 04/21/17   Barnet Glasgow, NP   Meds Ordered and Administered this Visit  Medications -  No data to display  BP (!) 153/66 (BP Location: Right Arm)   Pulse 60   Temp 98.5 F (36.9 C) (Oral)   Resp 18   SpO2 98%  No data found.   Physical Exam  Constitutional: She is oriented to person, place, and time. She appears well-developed and well-nourished. No distress.  HENT:  Head: Normocephalic and atraumatic.  Right Ear: External ear normal.  Left Ear: External ear normal.  Eyes: Conjunctivae are normal.  Neck: Normal range of motion.  Musculoskeletal:       Left shoulder: She exhibits decreased range of motion, tenderness and pain. She exhibits normal pulse.  Positive drop arm test, positive empty can test, tenderness over the insertion of the  infraspinatus.  Neurological: She is alert and oriented to person, place, and time.  Skin: Skin is warm and dry. Capillary refill takes less than 2 seconds. No rash noted. She is not diaphoretic. No erythema.  Psychiatric: She has a normal mood and affect. Her behavior is normal.  Nursing note and vitals reviewed.   Urgent Care Course     Procedures (including critical care time)  Labs Review Labs Reviewed - No data to display  Imaging Review No results found.   MDM   1. Bursitis of left shoulder     Given course of prednisone, also may take over-the-counter Tylenol as well, follow up with her PCP or orthopedist if symptoms persist.   Barnet Glasgow, NP 04/21/17 1542

## 2017-04-21 NOTE — ED Triage Notes (Signed)
Pt here for left shoulder pain onset 3 weeks  Reports she might have strained her muscles 1 month ago while doing some yard work  Does not recall any inj/trauma to shoulder  Pain increases w/movement.   A&O x4... NAD... Ambulatory

## 2017-05-31 ENCOUNTER — Telehealth: Payer: Self-pay | Admitting: Endocrinology

## 2017-05-31 NOTE — Telephone Encounter (Signed)
Patient called yesterday to get an appointment today with Dr. Loanne Drilling (who is out of the office) for her shoulder pain. Please call [phone] to advise the patient on other options.

## 2017-05-31 NOTE — Telephone Encounter (Signed)
Please see if one of our other offices can see.  If not, urgent care.

## 2017-05-31 NOTE — Telephone Encounter (Signed)
Called and spoke with patient to try to get her in for earliest available appt but wants to be seen sooner. I told her I would ask if she should be referred to urgent care or try to get her in with someone from [facility] since you are her PCP? Advise?

## 2017-05-31 NOTE — Telephone Encounter (Signed)
Routing to you °

## 2017-06-04 ENCOUNTER — Ambulatory Visit (INDEPENDENT_AMBULATORY_CARE_PROVIDER_SITE_OTHER): Payer: Medicare Other | Admitting: Endocrinology

## 2017-06-04 ENCOUNTER — Encounter: Payer: Self-pay | Admitting: Endocrinology

## 2017-06-04 VITALS — BP 132/90 | HR 97 | Wt 134.0 lb

## 2017-06-04 DIAGNOSIS — Z119 Encounter for screening for infectious and parasitic diseases, unspecified: Secondary | ICD-10-CM | POA: Diagnosis not present

## 2017-06-04 DIAGNOSIS — M25519 Pain in unspecified shoulder: Secondary | ICD-10-CM | POA: Insufficient documentation

## 2017-06-04 DIAGNOSIS — E119 Type 2 diabetes mellitus without complications: Secondary | ICD-10-CM | POA: Diagnosis not present

## 2017-06-04 DIAGNOSIS — M25512 Pain in left shoulder: Secondary | ICD-10-CM | POA: Diagnosis not present

## 2017-06-04 DIAGNOSIS — Z780 Asymptomatic menopausal state: Secondary | ICD-10-CM | POA: Diagnosis not present

## 2017-06-04 DIAGNOSIS — Z23 Encounter for immunization: Secondary | ICD-10-CM | POA: Diagnosis not present

## 2017-06-04 LAB — POCT GLYCOSYLATED HEMOGLOBIN (HGB A1C): Hemoglobin A1C: 5.5

## 2017-06-04 NOTE — Patient Instructions (Addendum)
Please see a specialist, for your shoulder.  you will receive a phone call, about a day and time for an appointment. blood tests are requested for you today.  We'll let you know about the results. Please consider these measures for your health:  minimize alcohol.  Do not use tobacco products.  Have a colonoscopy at least every 10 years from age 70.  Women should have an annual mammogram from age 67.  Keep firearms safely stored.  Always use seat belts.  have working smoke alarms in your home.  See an eye doctor and dentist regularly.  Never drive under the influence of alcohol or drugs (including prescription drugs).  Those with fair skin should take precautions against the sun, and should carefully examine their skin once per month, for any new or changed moles.  It is critically important to prevent falling down (keep floor areas well-lit, dry, and free of loose objects.  If you have a cane, walker, or wheelchair, you should use it, even for short trips around the house.  Wear flat-soled shoes.  Also, try not to rush).  Please return in 1 year.

## 2017-06-04 NOTE — Progress Notes (Signed)
Subjective:    Patient ID: Mia Newman, female    DOB: 1946/01/13, 71 y.o.   MRN: [id number]  HPI Pt states few mos of left shoulder pain, which started with doing yard work.  She got temporary relief from prednisone, but it recurred.  No assoc numbness.   Past Medical History:  Diagnosis Date  . Allergy   . Arthritis   . ASYMPTOMATIC POSTMENOPAUSAL STATUS 03/03/2010  . DIVERTICULITIS-COLON 05/22/2008  . DIVERTICULOSIS, COLON 05/20/2008  . DM 04/22/2008   borderline  . Glaucoma   . Hyperthyroidism   . Irritable bowel syndrome   . Personal history of colonic polyp - adenoma 03/03/2014  . Pure hypercholesterolemia 09/27/2007  . Unspecified hypothyroidism 04/22/2008    Past Surgical History:  Procedure Laterality Date  . ABDOMINAL HYSTERECTOMY  1987 apx  . 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 BREAST SURGERY    . COLONOSCOPY    . TUBAL LIGATION    . VSD REPAIR  1996    Social History   Social History  . Marital status: Married    Spouse name: N/A  . Number of children: N/A  . Years of education: N/A   Occupational History  . Retired    Social History Main Topics  . Smoking status: Never Smoker  . Smokeless tobacco: Never Used  . Alcohol use No  . Drug use: No  . Sexual activity: Yes    Birth control/ protection: None   Other Topics Concern  . Not on file   Social History Narrative  . No narrative on file    Current Outpatient Prescriptions on File Prior to Visit  Medication Sig Dispense Refill  . acetaminophen (TYLENOL) 500 MG tablet Take 500 mg by mouth 2 (two) times daily.      . cyclobenzaprine (FLEXERIL) 10 MG tablet Take 1 tablet (10 mg total) by mouth 3 (three) times daily as needed for muscle spasms. 30 tablet 5  . latanoprost (XALATAN) 0.005 % ophthalmic solution Place 1 drop into both eyes at bedtime.      . brimonidine (ALPHAGAN) 0.2 % ophthalmic solution Place 1 drop into both eyes 2 (two) times daily.       No  current facility-administered medications on file prior to visit.     Allergies  Allergen Reactions  . Atorvastatin     REACTION: myalgias  . Raloxifene     REACTION: unspecified  . Simvastatin     REACTION: myalgias (pt perceives due to zocor)  . Sulfamethoxazole     REACTION: unspecified    Family History  Problem Relation Age of Onset  . Heart attack Father 80  . Cancer Maternal Grandmother        Uterine Cancer  . Cancer Paternal Grandmother        Stomach Cancer  . Colon cancer Neg Hx     BP 132/90   Pulse 97   Wt 134 lb (60.8 kg)   SpO2 (!) 72%   BMI 25.32 kg/m    Review of Systems Denies chest pain and sob.      Objective:   Physical Exam VS: see vs page GEN: no distress HEAD: head: no deformity eyes: no periorbital swelling, no proptosis.   external nose and ears are normal.  mouth: no lesion seen NECK: supple, thyroid is not enlarged CHEST WALL: no deformity LUNGS: clear to auscultation.  CV: reg rate and rhythm, no murmur.  ABD: abdomen is  soft, nontender.  no hepatosplenomegaly.  not distended.  no hernia MUSCULOSKELETAL: muscle bulk and strength are grossly normal.  no obvious joint swelling.  gait is normal and steady.  Left shoulder is slightly tender.  She cannot raise LUE past shoulder height, due to pain.  EXTEMITIES: no deformity.  no ulcer on the feet.  feet are of normal color and temp.  no edema.  PULSES: dorsalis pedis intact bilat.  no carotid bruit NEURO:  cn 2-12 grossly intact.   readily moves all 4's.  sensation is intact to touch on the feet SKIN:  Normal texture and temperature.  No rash or suspicious lesion is visible.   NODES:  None palpable at the neck PSYCH: alert, well-oriented.  Does not appear anxious nor depressed.    I personally reviewed electrocardiogram tracing (today): Indication: DM Impression: NSR.  No MI.  Voltage criteria for LVH Compared to 2017: no significant change     Assessment & Plan:  Shoulder pain:  new. Please see a specialist, for your shoulder.  you will receive a phone call, about a day and time for an appointment. Dyslipidemia: due for recheck.  blood tests are requested for you today.  We'll let you know about the results. Hypothyroidism: recheck today.   Subjective:   Patient here for Medicare annual wellness visit and management of other chronic and acute problems.  Risk factors: advanced age    86 of Physicians Providing Medical Care to Patient:  See "snapshot"  Activities of Daily Living: In your present state of health, do you have any difficulty performing the following activities (lives with husband)?:  Preparing food and eating?: No  Bathing yourself: No  Getting dressed: No  Using the toilet:No  Moving around from place to place: No  In the past year have you fallen or had a near fall?:No    Home Safety: Has smoke detector and wears seat belts. Firearms are safely stored. No excess sun exposure.   Diet and Exercise  Current exercise habits: pt says good Dietary issues discussed: pt reports a healthy diet   Depression Screen  Q1: Over the past two weeks, have you felt down, depressed or hopeless? no  Q2: Over the past two weeks, have you felt little interest or pleasure in doing things? no   The following portions of the patient's history were reviewed and updated as appropriate: allergies, current medications, past family history, past medical history, past social history, past surgical history and problem list.   Review of Systems  Denies hearing loss, and visual loss Objective:   Vision:  Advertising account executive, so she declines VA today Hearing: grossly normal Body mass index:  See vs page Msk: pt easily and quickly performs "get-up-and-go" from a sitting position Cognitive Impairment Assessment: cognition, memory and judgment appear normal.  remembers 3/3 at 5 minutes.  excellent recall.  can easily read and write a sentence.  alert and oriented x  3   Assessment:   Medicare wellness utd on preventive parameters.    Plan:   During the course of the visit the patient was educated and counseled about appropriate screening and preventive services including:        Fall prevention is advised  Screening mammography is UTD Bone densitometry screening is ordered Diabetes screening is done Nutrition counseling is offered  Vaccines are updated as needed  Patient Instructions (the written plan) was given to the patient.

## 2017-06-04 NOTE — Progress Notes (Signed)
we discussed code status.  pt requests full code, but would not want to be started or maintained on artificial life-support measures if there was not a reasonable chance of recovery 

## 2017-06-05 LAB — URINALYSIS, ROUTINE W REFLEX MICROSCOPIC
BILIRUBIN URINE: NEGATIVE
Hgb urine dipstick: NEGATIVE
KETONES UR: NEGATIVE
Nitrite: NEGATIVE
PH: 6.5 (ref 5.0–8.0)
RBC / HPF: NONE SEEN (ref 0–?)
Specific Gravity, Urine: <=1.005 — AB (ref 1.000–1.030)
Total Protein, Urine: NEGATIVE
URINE GLUCOSE: NEGATIVE
UROBILINOGEN UA: 0.2 (ref 0.0–1.0)

## 2017-06-05 LAB — HEPATIC FUNCTION PANEL
ALT: 11 U/L (ref 0–35)
AST: 14 U/L (ref 0–37)
Albumin: 4.3 g/dL (ref 3.5–5.2)
Alkaline Phosphatase: 45 U/L (ref 39–117)
BILIRUBIN TOTAL: 0.4 mg/dL (ref 0.2–1.2)
Bilirubin, Direct: 0.2 mg/dL (ref 0.0–0.3)
Total Protein: 6.3 g/dL (ref 6.0–8.3)

## 2017-06-05 LAB — CBC WITH DIFFERENTIAL/PLATELET
BASOS ABS: 0.1 10*3/uL (ref 0.0–0.1)
Basophils Relative: 1.4 % (ref 0.0–3.0)
EOS ABS: 0.2 10*3/uL (ref 0.0–0.7)
Eosinophils Relative: 2.6 % (ref 0.0–5.0)
HEMATOCRIT: 42.9 % (ref 36.0–46.0)
HEMOGLOBIN: 14.2 g/dL (ref 12.0–15.0)
LYMPHS PCT: 30.3 % (ref 12.0–46.0)
Lymphs Abs: 2 10*3/uL (ref 0.7–4.0)
MCHC: 33.1 g/dL (ref 30.0–36.0)
MCV: 93.4 fl (ref 78.0–100.0)
Monocytes Absolute: 0.6 10*3/uL (ref 0.1–1.0)
Monocytes Relative: 9.9 % (ref 3.0–12.0)
Neutro Abs: 3.6 10*3/uL (ref 1.4–7.7)
Neutrophils Relative %: 55.8 % (ref 43.0–77.0)
Platelets: 273 10*3/uL (ref 150.0–400.0)
RBC: 4.59 Mil/uL (ref 3.87–5.11)
RDW: 14.3 % (ref 11.5–15.5)
WBC: 6.5 10*3/uL (ref 4.0–10.5)

## 2017-06-05 LAB — MICROALBUMIN / CREATININE URINE RATIO
Creatinine,U: 33.3 mg/dL
Microalb Creat Ratio: 2.1 mg/g (ref 0.0–30.0)

## 2017-06-05 LAB — BASIC METABOLIC PANEL
BUN: 8 mg/dL (ref 6–23)
CHLORIDE: 105 meq/L (ref 96–112)
CO2: 28 mEq/L (ref 19–32)
CREATININE: 0.64 mg/dL (ref 0.40–1.20)
Calcium: 9.3 mg/dL (ref 8.4–10.5)
GFR: 97.38 mL/min (ref >60.00)
GLUCOSE: 95 mg/dL (ref 70–99)
Potassium: 4 mEq/L (ref 3.5–5.1)
Sodium: 141 mEq/L (ref 135–145)

## 2017-06-05 LAB — TSH: TSH: 0.13 u[IU]/mL — AB (ref 0.35–4.50)

## 2017-06-05 LAB — HEPATITIS C ANTIBODY: HCV Ab: NONREACTIVE

## 2017-06-06 NOTE — Progress Notes (Signed)
Called patient back to ask what dosage she was taking, but had to leave VM.

## 2017-06-07 ENCOUNTER — Telehealth: Payer: Self-pay

## 2017-06-07 ENCOUNTER — Other Ambulatory Visit: Payer: Self-pay | Admitting: Endocrinology

## 2017-06-07 DIAGNOSIS — E890 Postprocedural hypothyroidism: Secondary | ICD-10-CM

## 2017-06-07 MED ORDER — LEVOTHYROXINE SODIUM 50 MCG PO TABS: 50.0000 ug | ORAL_TABLET | Freq: Every day | ORAL | 5 refills | Status: DC

## 2017-06-07 NOTE — Telephone Encounter (Signed)
Patient notified Bone density has been scheduled for 06/21/2017 at 10 am on [place]. Patient voiced understanding and and no further questions.

## 2017-06-08 ENCOUNTER — Other Ambulatory Visit: Payer: Self-pay

## 2017-06-08 MED ORDER — LEVOTHYROXINE SODIUM 50 MCG PO TABS: 50.0000 ug | ORAL_TABLET | Freq: Every day | ORAL | 5 refills | Status: DC

## 2017-06-11 DIAGNOSIS — H47293 Other optic atrophy, bilateral: Secondary | ICD-10-CM | POA: Diagnosis not present

## 2017-06-18 NOTE — Progress Notes (Signed)
[facility] [address] [place], [place] [address] Phone: [phone] Subjective:    I'm seeing this patient by the request  of:  Renato Shin, MD   CC: Left shoulder pain  [id number]  Mia Newman is a 71 y.o. female coming in with complaint of left shoulder pain. Been going on 3 months. Notices it after gardening. Sensation was having worsening pain. When she to urgent care 2 times and has been given 2 rounds of prednisone with only mild improvement each time and then the pain seems to come back. Waking her up at night. Positive weakness. Denies any radiation down the arm. Rates the severity of pain a 7 out of 10      Past Medical History:  Diagnosis Date  . Allergy   . Arthritis   . ASYMPTOMATIC POSTMENOPAUSAL STATUS 03/03/2010  . DIVERTICULITIS-COLON 05/22/2008  . DIVERTICULOSIS, COLON 05/20/2008  . DM 04/22/2008   borderline  . Glaucoma   . Hyperthyroidism   . Irritable bowel syndrome   . Personal history of colonic polyp - adenoma 03/03/2014  . Pure hypercholesterolemia 09/27/2007  . Unspecified hypothyroidism 04/22/2008   Past Surgical History:  Procedure Laterality Date  . ABDOMINAL HYSTERECTOMY  1987 apx  . 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 BREAST SURGERY    . COLONOSCOPY    . TUBAL LIGATION    . VSD REPAIR  1996   Social History   Social History  . Marital status: Married    Spouse name: N/A  . Number of children: N/A  . Years of education: N/A   Occupational History  . Retired    Social History Main Topics  . Smoking status: Never Smoker  . Smokeless tobacco: Never Used  . Alcohol use No  . Drug use: No  . Sexual activity: Yes    Birth control/ protection: None   Other Topics Concern  . Not on file   Social History Narrative  . No narrative on file   Allergies  Allergen Reactions  . Atorvastatin     REACTION: myalgias  . Raloxifene     REACTION: unspecified  .  Simvastatin     REACTION: myalgias (pt perceives due to zocor)  . Sulfamethoxazole     REACTION: unspecified   Family History  Problem Relation Age of Onset  . Heart attack Father 3  . Cancer Maternal Grandmother        Uterine Cancer  . Cancer Paternal Grandmother        Stomach Cancer  . Colon cancer Neg Hx      Past medical history, social, surgical and family history all reviewed in electronic medical record.  No pertanent information unless stated regarding to the chief complaint.   Review of Systems:Review of systems updated and as accurate as of 06/19/17  No headache, visual changes, nausea, vomiting, diarrhea, constipation, dizziness, abdominal pain, skin rash, fevers, chills, night sweats, weight loss, swollen lymph nodes, body aches, joint swelling, chest pain, shortness of breath, mood changes.  Positive muscle aches  Objective  Blood pressure (!) 150/80, pulse 85, weight 135 lb (61.2 kg), SpO2 97 %. Systems examined below as of 06/19/17   General: No apparent distress alert and oriented x3 mood and affect normal, dressed appropriately.  HEENT: Pupils equal, extraocular movements intact  Respiratory: Patient's speak in full sentences and does not appear short of breath  Cardiovascular: No lower extremity edema, non tender,  no erythema  Skin: Warm dry intact with no signs of infection or rash on extremities or on axial skeleton.  Abdomen: Soft nontender  Neuro: Cranial nerves II through XII are intact, neurovascularly intact in all extremities with 2+ DTRs and 2+ pulses.  Lymph: No lymphadenopathy of posterior or anterior cervical chain or axillae bilaterally.  Gait normal with good balance and coordination.  MSK:  Non tender with full range of motion and good stability and symmetric strength and tone of  elbows, wrist, hip, knee and ankles bilaterally. Arthritic changes of multiple joints Shoulder: left Inspection mild, atrophy or asymmetry. Palpation is normal with  no tenderness over AC joint or bicipital groove. ROM is decreased 5 in all planes Rotator cuff strength 4 out of 5 compared to the contralateral side signs of impingement with positive Neer and Hawkin's tests, but negative empty can sign. Speeds and Yergason's tests normal. Positive O'Brien's Normal scapular function observed. No painful arc and no drop arm sign. No apprehension sign Contralateral shoulder unremarkable  MSK US performed of: left This study was ordered, performed, and interpreted by Charlann Boxer D.O.  Shoulder:   Supraspinatus:  Does have a tear noted. Near full-thickness. No retraction noted., Bursal bulge seen with shoulder abduction on impingement view. Infraspinatus:  Appears normal on long and transverse views. Significant increase in Doppler flow Subscapularis:  Appears normal on long and transverse views. Positive bursa AC joint:  Moderate arthritis Glenohumeral Joint:  Mild arthritis. Glenoid Labrum:  Intact without visualized tears. Biceps Tendon:  Appears normal on long and transverse views, no fraying of tendon, tendon located in intertubercular groove, no subluxation with shoulder internal or external rotation.  Impression: Supraspinatus tear  Procedure: Real-time Ultrasound Guided Injection of left glenohumeral joint Device: GE Logiq E  Ultrasound guided injection is preferred based studies that show increased duration, increased effect, greater accuracy, decreased procedural pain, increased response rate with ultrasound guided versus blind injection.  Verbal informed consent obtained.  Time-out conducted.  Noted no overlying erythema, induration, or other signs of local infection.  Skin prepped in a sterile fashion.  Local anesthesia: Topical Ethyl chloride.  With sterile technique and under real time ultrasound guidance:  Joint visualized.  23g 1  inch needle inserted posterior approach. Pictures taken for needle placement. Patient did have injection of  2 cc of 1% lidocaine, 2 cc of 0.5% Marcaine, and 1.0 cc of Kenalog 40 mg/dL. Completed without difficulty  Pain immediately resolved suggesting accurate placement of the medication.  Advised to call if fevers/chills, erythema, induration, drainage, or persistent bleeding.  Images permanently stored and available for review in the ultrasound unit.  Impression: Technically successful ultrasound guided injection.  97110; 15 additional minutes spent for Therapeutic exercises as stated in above notes.  This included exercises focusing on stretching, strengthening, with significant focus on eccentric aspects.   Long term goals include an improvement in range of motion, strength, endurance as well as avoiding reinjury. Patient's frequency would include in 1-2 times a day, 3-5 times a week for a duration of 6-12 weeks.  Shoulder Exercises that included:  Basic scapular stabilization to include adduction and depression of scapula Scaption, focusing on proper movement and good control Internal and External rotation utilizing a theraband, with elbow tucked at side entire time Rows with theraband  Proper technique shown and discussed handout in great detail with ATC.  All questions were discussed and answered.     Impression and Recommendations:     This case required medical  decision making of moderate complexity.      Note: This dictation was prepared with Dragon dictation along with smaller phrase technology. Any transcriptional errors that result from this process are unintentional.

## 2017-06-19 ENCOUNTER — Ambulatory Visit: Payer: Self-pay

## 2017-06-19 ENCOUNTER — Ambulatory Visit (INDEPENDENT_AMBULATORY_CARE_PROVIDER_SITE_OTHER): Payer: Medicare Other | Admitting: Family Medicine

## 2017-06-19 ENCOUNTER — Encounter: Payer: Self-pay | Admitting: Family Medicine

## 2017-06-19 VITALS — BP 150/80 | HR 85 | Wt 135.0 lb

## 2017-06-19 DIAGNOSIS — M25512 Pain in left shoulder: Secondary | ICD-10-CM

## 2017-06-19 DIAGNOSIS — M75112 Incomplete rotator cuff tear or rupture of left shoulder, not specified as traumatic: Secondary | ICD-10-CM | POA: Diagnosis not present

## 2017-06-19 DIAGNOSIS — M75102 Unspecified rotator cuff tear or rupture of left shoulder, not specified as traumatic: Secondary | ICD-10-CM | POA: Insufficient documentation

## 2017-06-19 MED ORDER — GABAPENTIN 100 MG PO CAPS: 200.0000 mg | ORAL_CAPSULE | Freq: Every day | ORAL | 3 refills | Status: DC

## 2017-06-19 NOTE — Patient Instructions (Signed)
Good to see you.  Ice 20 minutes 2 times daily. Usually after activity and before bed. Exercises 3 times a week.  pennsaid pinkie amount topically 2 times daily as needed.  Gabapentin 100-200mg  at night Keep hands within peripheral vision Avoid heavy lifting See me again in 4-6 weeks.

## 2017-06-19 NOTE — Assessment & Plan Note (Signed)
Patient has what appears to be in perfect rotator cuff tear. Discussed with patient at great length, we discussed icing regimen and home exercises, we discussed objective is a doing which ones to avoid. Patient will start increasing activity slowly. Home exercises given. Avoid any heavy lifting. Follow-up again in 4-6 weeks for further evaluation and treatment.

## 2017-06-21 ENCOUNTER — Ambulatory Visit (INDEPENDENT_AMBULATORY_CARE_PROVIDER_SITE_OTHER)
Admission: RE | Admit: 2017-06-21 | Discharge: 2017-06-21 | Disposition: A | Discharge status: Home / Self Care | Payer: Medicare Other | Source: Ambulatory Visit | Attending: Endocrinology | Admitting: Endocrinology

## 2017-06-21 DIAGNOSIS — Z780 Asymptomatic menopausal state: Secondary | ICD-10-CM | POA: Diagnosis not present

## 2017-07-09 ENCOUNTER — Other Ambulatory Visit (INDEPENDENT_AMBULATORY_CARE_PROVIDER_SITE_OTHER): Payer: Medicare Other

## 2017-07-09 DIAGNOSIS — E890 Postprocedural hypothyroidism: Secondary | ICD-10-CM | POA: Diagnosis not present

## 2017-07-09 LAB — TSH: TSH: 3.7 u[IU]/mL (ref 0.35–4.50)

## 2017-07-17 ENCOUNTER — Encounter: Payer: Self-pay | Admitting: Family Medicine

## 2017-07-17 ENCOUNTER — Ambulatory Visit (INDEPENDENT_AMBULATORY_CARE_PROVIDER_SITE_OTHER): Payer: Medicare Other | Admitting: Family Medicine

## 2017-07-17 DIAGNOSIS — M75112 Incomplete rotator cuff tear or rupture of left shoulder, not specified as traumatic: Secondary | ICD-10-CM

## 2017-07-17 NOTE — Patient Instructions (Signed)
Good to see you  Keep up the exercises 2-3 times a week.  Ice is your friend.  Stay active  Ok to do yard work in 2 weeks.  See me agai nin 6-8 weeks if not a lot better

## 2017-07-17 NOTE — Assessment & Plan Note (Signed)
Patient is improving at this time. Encourage patient to continue the conservative therapy. We discussed vitamin D. Patient will continue with conservative therapy and follow-up with me in 6-8 weeks for further evaluation

## 2017-07-17 NOTE — Progress Notes (Signed)
[facility] [place], [place] [address] Phone: [phone] Subjective:     CC: Left shoulder follow-up  TFT:[id number]  Mia Newman is a 71 y.o. female coming in for a follow up of left shoulder pain. Says her shoulder hurts a little but overall she is doing well. Patient states feeling 50-60% better. Has noticed improvement in strength. Patient still has some discomfort with certain range of motion but nothing severe.     Past Medical History:  Diagnosis Date  . Allergy   . Arthritis   . ASYMPTOMATIC POSTMENOPAUSAL STATUS 03/03/2010  . DIVERTICULITIS-COLON 05/22/2008  . DIVERTICULOSIS, COLON 05/20/2008  . DM 04/22/2008   borderline  . Glaucoma   . Hyperthyroidism   . Irritable bowel syndrome   . Personal history of colonic polyp - adenoma 03/03/2014  . Pure hypercholesterolemia 09/27/2007  . Unspecified hypothyroidism 04/22/2008   Past Surgical History:  Procedure Laterality Date  . ABDOMINAL HYSTERECTOMY  1987 apx  . 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 BREAST SURGERY    . COLONOSCOPY    . TUBAL LIGATION    . VSD REPAIR  1996   Social History   Social History  . Marital status: Married    Spouse name: N/A  . Number of children: N/A  . Years of education: N/A   Occupational History  . Retired    Social History Main Topics  . Smoking status: Never Smoker  . Smokeless tobacco: Never Used  . Alcohol use No  . Drug use: No  . Sexual activity: Yes    Birth control/ protection: None   Other Topics Concern  . Not on file   Social History Narrative  . No narrative on file   Allergies  Allergen Reactions  . Atorvastatin     REACTION: myalgias  . Raloxifene     REACTION: unspecified  . Simvastatin     REACTION: myalgias (pt perceives due to zocor)  . Sulfamethoxazole     REACTION: unspecified   Family History  Problem Relation Age of Onset  . Heart attack Father 40  . Cancer  Maternal Grandmother        Uterine Cancer  . Cancer Paternal Grandmother        Stomach Cancer  . Colon cancer Neg Hx      Past medical history, social, surgical and family history all reviewed in electronic medical record.  No pertanent information unless stated regarding to the chief complaint.   Review of Systems:Review of systems updated and as accurate as of 07/17/17  No headache, visual changes, nausea, vomiting, diarrhea, constipation, dizziness, abdominal pain, skin rash, fevers, chills, night sweats, weight loss, swollen lymph nodes, body aches, joint swelling, muscle aches, chest pain, shortness of breath, mood changes.   Objective  There were no vitals taken for this visit. Systems examined below as of 07/17/17   General: No apparent distress alert and oriented x3 mood and affect normal, dressed appropriately.  HEENT: Pupils equal, extraocular movements intact  Respiratory: Patient's speak in full sentences and does not appear short of breath  Cardiovascular: No lower extremity edema, non tender, no erythema  Skin: Warm dry intact with no signs of infection or rash on extremities or on axial skeleton.  Abdomen: Soft nontender  Neuro: Cranial nerves II through XII are intact, neurovascularly intact in all extremities with 2+ DTRs and 2+ pulses.  Lymph: No lymphadenopathy of posterior or  anterior cervical chain or axillae bilaterally.  Gait normal with good balance and coordination.  MSK:  Non tender with full range of motion and good stability and symmetric strength and tone of  elbows, wrist, hip, knee and ankles bilaterally. Arthritic changes of multiple joints Shoulder: Left Inspection reveals no abnormalities, atrophy or asymmetry. Palpation is normal with no tenderness over AC joint or bicipital groove. Lacking the last 10 of external rotation Rotator cuff strength 4 out of 5 but improved from previous exam Impingement still noted Speeds and Yergason's tests  normal. No labral pathology noted with negative Obrien's, negative clunk and good stability. Normal scapular function observed. No painful arc and no drop arm sign. No apprehension sign Contralateral shoulder unremarkable      Impression and Recommendations:     This case required medical decision making of moderate complexity.      Note: This dictation was prepared with Dragon dictation along with smaller phrase technology. Any transcriptional errors that result from this process are unintentional.

## 2017-07-23 ENCOUNTER — Telehealth: Payer: Self-pay | Admitting: Endocrinology

## 2017-07-23 ENCOUNTER — Other Ambulatory Visit: Payer: Self-pay

## 2017-07-23 MED ORDER — CEPHALEXIN 500 MG PO CAPS: 500.0000 mg | ORAL_CAPSULE | Freq: Three times a day (TID) | ORAL | 0 refills | Status: DC

## 2017-07-23 NOTE — Telephone Encounter (Signed)
Medication was sent to the incorrect pharmacy. Please resend to [place], [place]

## 2017-07-23 NOTE — Telephone Encounter (Signed)
Ok, I have sent a prescription to your pharmacy 

## 2017-07-23 NOTE — Telephone Encounter (Signed)
Patient called in reference to burning and cramping when using the restroom. Patient stated at her last appointment with Dr. Loanne Drilling, he mentioned she might have a UTI from urine sample he collected. Patient would like to know if she can get a antibiotic called into pharmacy. Please call patient with any questions on number provided or cell at [phone]. OK to leave message.   [place], [place] [phone] (Phone) [phone] (Fax)

## 2017-07-23 NOTE — Telephone Encounter (Signed)
Called and spoke with patient's husband because he stated that wife was in a meeting. I notified him that a prescription was sent to patient's pharmacy.

## 2017-08-28 ENCOUNTER — Ambulatory Visit (INDEPENDENT_AMBULATORY_CARE_PROVIDER_SITE_OTHER): Payer: Medicare Other | Admitting: Family Medicine

## 2017-08-28 ENCOUNTER — Encounter: Payer: Self-pay | Admitting: Family Medicine

## 2017-08-28 ENCOUNTER — Ambulatory Visit: Payer: Self-pay

## 2017-08-28 VITALS — BP 154/90 | HR 69 | Ht 61.0 in | Wt 136.0 lb

## 2017-08-28 DIAGNOSIS — M25519 Pain in unspecified shoulder: Secondary | ICD-10-CM

## 2017-08-28 DIAGNOSIS — M19012 Primary osteoarthritis, left shoulder: Secondary | ICD-10-CM | POA: Diagnosis not present

## 2017-08-28 DIAGNOSIS — M19019 Primary osteoarthritis, unspecified shoulder: Secondary | ICD-10-CM | POA: Insufficient documentation

## 2017-08-28 DIAGNOSIS — M75102 Unspecified rotator cuff tear or rupture of left shoulder, not specified as traumatic: Secondary | ICD-10-CM

## 2017-08-28 NOTE — Patient Instructions (Signed)
Good to see you  Mia Newman is your friend. Ice 20 minutes 2 times daily. Usually after activity and before bed. We will get you in with PT and I hope it helps Ice is your friend.  Stay active  Keep hands within peripheral vision See me again in 4-6 weeks

## 2017-08-28 NOTE — Progress Notes (Signed)
[facility] [place], [place] [address] Phone: [phone] Subjective:    I'm seeing this patient by the request  of:    CC: left shoulder pain   BJS:[id number]  Mia Newman is a 71 y.o. female coming in with complaint of left shoulder pain patient found to have rotator cuff tear.  States that the injection she was given helped 50%.  Has been doing the home exercises very intermittently.  Patient denies any numbness or tingling.  Patient rates the severity of pain is continuing to go down the anterior aspect of the arm.  Patient states that the weakness is less though.      Past Medical History:  Diagnosis Date  . Allergy   . Arthritis   . ASYMPTOMATIC POSTMENOPAUSAL STATUS 03/03/2010  . DIVERTICULITIS-COLON 05/22/2008  . DIVERTICULOSIS, COLON 05/20/2008  . DM 04/22/2008   borderline  . Glaucoma   . Hyperthyroidism   . Irritable bowel syndrome   . Personal history of colonic polyp - adenoma 03/03/2014  . Pure hypercholesterolemia 09/27/2007  . Unspecified hypothyroidism 04/22/2008   Past Surgical History:  Procedure Laterality Date  . ABDOMINAL HYSTERECTOMY  1987 apx  . 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 BREAST SURGERY    . COLONOSCOPY    . TUBAL LIGATION    . VSD REPAIR  1996   Social History   Socioeconomic History  . Marital status: Married    Spouse name: None  . Number of children: None  . Years of education: None  . Highest education level: None  Social Needs  . Financial resource strain: None  . Food insecurity - worry: None  . Food insecurity - inability: None  . Transportation needs - medical: None  . Transportation needs - non-medical: None  Occupational History  . Occupation: Retired  Tobacco Use  . Smoking status: Never Smoker  . Smokeless tobacco: Never Used  Substance and Sexual Activity  . Alcohol use: No  . Drug use: No  . Sexual activity: Yes    Birth  control/protection: None  Other Topics Concern  . None  Social History Narrative  . None   Allergies  Allergen Reactions  . Atorvastatin     REACTION: myalgias  . Raloxifene     REACTION: unspecified  . Simvastatin     REACTION: myalgias (pt perceives due to zocor)  . Sulfamethoxazole     REACTION: unspecified   Family History  Problem Relation Age of Onset  . Heart attack Father 27  . Cancer Maternal Grandmother        Uterine Cancer  . Cancer Paternal Grandmother        Stomach Cancer  . Colon cancer Neg Hx      Past medical history, social, surgical and family history all reviewed in electronic medical record.  No pertanent information unless stated regarding to the chief complaint.   Review of Systems:Review of systems updated and as accurate as of 08/28/17  No headache, visual changes, nausea, vomiting, diarrhea, constipation, dizziness, abdominal pain, skin rash, fevers, chills, night sweats, weight loss, swollen lymph nodes, body aches, joint swelling,  chest pain, shortness of breath, mood changes.  Positive muscle aches  Objective  Blood pressure (!) 154/90, pulse 69, height 5\' 1"  (1.549 m), weight 136 lb (61.7 kg), SpO2 91 %. Systems examined below as of 08/28/17   General: No apparent distress alert and oriented x3 mood  and affect normal, dressed appropriately.  HEENT: Pupils equal, extraocular movements intact  Respiratory: Patient's speak in full sentences and does not appear short of breath  Cardiovascular: No lower extremity edema, non tender, no erythema  Skin: Warm dry intact with no signs of infection or rash on extremities or on axial skeleton.  Abdomen: Soft nontender  Neuro: Cranial nerves II through XII are intact, neurovascularly intact in all extremities with 2+ DTRs and 2+ pulses.  Lymph: No lymphadenopathy of posterior or anterior cervical chain or axillae bilaterally.  Gait normal with good balance and coordination.  MSK:  Non tender with full  range of motion and good stability and symmetric strength and tone of  elbows, wrist, hip, knee and ankles bilaterally.  Shoulder: Left Inspection reveals no abnormalities, atrophy or asymmetry. More pain over the acromioclavicular joint Mild limitation in external range of motion Rotator cuff strength 4+ out of 5 which is an improvement No signs of impingement with negative Neer and Hawkin's tests, empty can sign. Speeds and Yergason's tests normal. No labral pathology noted with negative Obrien's, negative clunk and good stability.  Positive crossover Normal scapular function observed. No painful arc and no drop arm sign. No apprehension sign  Procedure: Real-time Ultrasound Guided Injection of left acromioclavicular joint Device: GE Logiq Q7 Ultrasound guided injection is preferred based studies that show increased duration, increased effect, greater accuracy, decreased procedural pain, increased response rate, and decreased cost with ultrasound guided versus blind injection.  Verbal informed consent obtained.  Time-out conducted.  Noted no overlying erythema, induration, or other signs of local infection.  Skin prepped in a sterile fashion.  Local anesthesia: Topical Ethyl chloride.  With sterile technique and under real time ultrasound guidance: With a 25-gauge half inch needle was injected with a total of 0.5 cc of 0.5% Marcaine and 0.5 cc of Kenalog 40 mg/dL. Completed without difficulty  Pain immediately resolved suggesting accurate placement of the medication.  Advised to call if fevers/chills, erythema, induration, drainage, or persistent bleeding.  Images permanently stored and available for review in the ultrasound unit.  Impression: Technically successful ultrasound guided injection.   Impression and Recommendations:     This case required medical decision making of moderate complexity.      Note: This dictation was prepared with Dragon dictation along with smaller  phrase technology. Any transcriptional errors that result from this process are unintentional.

## 2017-08-28 NOTE — Assessment & Plan Note (Signed)
Seems to be doing better overall.  I think patient is healing but I would like patient to start with formal physical therapy to increase strength.  Patient is in agreement with the plan.  Discussed icing regimen, patient will continue.  Follow-up with me again in 4 weeks

## 2017-08-28 NOTE — Assessment & Plan Note (Signed)
Patient given an injection today.  Tolerated the procedure well.  We discussed icing regimen and home exercise.  Discussed icing regimen, which activities to do which wants to avoid.  Could be playing some role in this.  We will continue to monitor.  Follow-up in 4 weeks

## 2017-09-10 DIAGNOSIS — H47293 Other optic atrophy, bilateral: Secondary | ICD-10-CM | POA: Diagnosis not present

## 2017-09-11 ENCOUNTER — Encounter: Payer: Self-pay | Admitting: Physical Therapy

## 2017-09-11 ENCOUNTER — Ambulatory Visit: Payer: Medicare Other | Attending: Family Medicine | Admitting: Physical Therapy

## 2017-09-11 DIAGNOSIS — M25512 Pain in left shoulder: Secondary | ICD-10-CM | POA: Insufficient documentation

## 2017-09-11 DIAGNOSIS — M25612 Stiffness of left shoulder, not elsewhere classified: Secondary | ICD-10-CM | POA: Insufficient documentation

## 2017-09-11 NOTE — Therapy (Signed)
[place], [place], [address] Phone: [phone]   Fax:  [phone]  Physical Therapy Evaluation  Patient Details  Name: Mia Newman MRN: [id number] Date of Birth: Jun 25, 1946 Referring Provider: Creig Hines   Encounter Date: 09/11/2017  PT End of Session - 09/11/17 1125    Visit Number  1    Date for PT Re-Evaluation  11/11/17    PT Start Time  1055    PT Stop Time  1130    PT Time Calculation (min)  35 min    Activity Tolerance  Patient tolerated treatment well    Behavior During Therapy  [facility] for tasks assessed/performed       Past Medical History:  Diagnosis Date  . Allergy   . Arthritis   . ASYMPTOMATIC POSTMENOPAUSAL STATUS 03/03/2010  . DIVERTICULITIS-COLON 05/22/2008  . DIVERTICULOSIS, COLON 05/20/2008  . DM 04/22/2008   borderline  . Glaucoma   . Hyperthyroidism   . Irritable bowel syndrome   . Personal history of colonic polyp - adenoma 03/03/2014  . Pure hypercholesterolemia 09/27/2007  . Unspecified hypothyroidism 04/22/2008    Past Surgical History:  Procedure Laterality Date  . ABDOMINAL HYSTERECTOMY  1987 apx  . 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 BREAST SURGERY    . COLONOSCOPY    . TUBAL LIGATION    . VSD REPAIR  1996    There were no vitals filed for this visit.   Subjective Assessment - 09/11/17 1100    Subjective  Patient reports that she feels like back in April she was doing some yardwork and started having pain in the left shoulder.  She reports that she endured quite a lot of pain before seeking medical help.  She has had two cortisone injections recently.  She reports that the pain is much better with the second injection.  US showed a small tear in the RC mm.    Limitations  House hold activities    Patient Stated Goals  have no pain and be able to garden    Currently in Pain?  Yes    Pain Score  1     Pain Location   Shoulder    Pain Orientation  Left    Pain Descriptors / Indicators  Aching;Sore    Pain Type  Acute pain    Pain Onset  More than a month ago    Pain Frequency  Intermittent    Aggravating Factors   dressing, doing hair, doing some housework pain wa up to 8/10    Pain Relieving Factors  at rest and with Tylenol she can have no pain since the second injection    Effect of Pain on Daily Activities  reports limits ADL's. painful         [facility] PT Assessment - 09/11/17 0001      Assessment   Medical Diagnosis  left RC pain    Referring Provider  Z. Smith    Onset Date/Surgical Date  02/09/17    Prior Therapy  nonew      Precautions   Precautions  None      Balance Screen   Has the patient fallen in the past 6 months  No    Has the patient had a decrease in activity level because of a fear of falling?   No    Is the patient reluctant to leave their home because  of a fear of falling?   No      Home Environment   Additional Comments  does housework, does yardowrk, likes to garden      Prior Function   Level of Independence  Independent    Vocation  Retired    Leisure  no exercise      Mining engineer Comments  fwd head, rounded shoulders      ROM / Strength   AROM / PROM / Strength  AROM;Strength;PROM      AROM   AROM Assessment Site  Shoulder    Right/Left Shoulder  Left    Left Shoulder Flexion  105 Degrees    Left Shoulder ABduction  100 Degrees    Left Shoulder Internal Rotation  15 Degrees    Left Shoulder External Rotation  80 Degrees      PROM   PROM Assessment Site  Shoulder    Right/Left Shoulder  Left    Left Shoulder Flexion  125 Degrees    Left Shoulder ABduction  120 Degrees    Left Shoulder Internal Rotation  20 Degrees    Left Shoulder External Rotation  90 Degrees      Strength   Overall Strength Comments  4-/5 with pain in the left lateral shoulder and upper arm             Objective measurements completed on  examination: See above findings.              PT Education - 09/11/17 1124    Education provided  Yes    Education Details  corner and doorway stretch, yellow tband ER, scapular stabilization    Person(s) Educated  Patient    Methods  Explanation;Demonstration;Tactile cues;Verbal cues;Handout    Comprehension  Verbal cues required;Tactile cues required;Need further instruction;Returned demonstration;Verbalized understanding       PT Short Term Goals - 09/11/17 1131      PT SHORT TERM GOAL #1   Title  independent with initial HEP    Time  1    Period  Weeks    Status  New        PT Long Term Goals - 09/11/17 1132      PT LONG TERM GOAL #1   Title  understand posture and body mechanics    Time  8    Period  Weeks    Status  New      PT LONG TERM GOAL #2   Title  decrease pain 50%    Period  Weeks    Status  New      PT LONG TERM GOAL #3   Title  increase IR to 50 degrees    Time  8    Period  Weeks    Status  New      PT LONG TERM GOAL #4   Title  report no difficulty with dressing and doing hair    Time  8    Period  Weeks    Status  New             Plan - 09/11/17 1126    Clinical Impression Statement  Patient reports 7 months of left shoulder pain, US revealed a small tear in the RC mms.  She reports that she has had 2 cortisone injections and is feeling much better.  Her left shoulder ROM is very limited IR to 10 degrees, Flexion to 115 degrees, she was tender and sore with MMT,  but was 4-/5 strength, she had positive impingment, strong with pain on empty can test    Clinical Presentation  Evolving    Clinical Presentation due to:  doing better with a cortisone injection    Rehab Potential  Good    PT Frequency  2x / week    PT Duration  8 weeks    PT Treatment/Interventions  ADLs/Self Care Home Management;Cryotherapy;Electrical Stimulation;Iontophoresis 4mg /ml Dexamethasone;Moist Heat;Therapeutic exercise;Therapeutic  activities;Patient/family education;Manual techniques    PT Next Visit Plan  Slowly add exercises, with her HEP she had a lot of elevation that she was doing and needed a lot of verbal and tactile cues to get retraction    Consulted and Agree with Plan of Care  Patient       Patient will benefit from skilled therapeutic intervention in order to improve the following deficits and impairments:  Decreased range of motion, Increased muscle spasms, Pain, Improper body mechanics, Postural dysfunction, Decreased strength  Visit Diagnosis: Acute pain of left shoulder - Plan: PT plan of care cert/re-cert  Stiffness of left shoulder, not elsewhere classified - Plan: PT plan of care cert/re-cert  G-Codes - 24/26/83 1135    Functional Assessment Tool Used (Outpatient Only)  foto 60% limitation    Functional Limitation  Other PT primary    Other PT Primary Current Status (M1962)  At least 60 percent but less than 80 percent impaired, limited or restricted    Other PT Primary Goal Status (I2979)  At least 40 percent but less than 60 percent impaired, limited or restricted        Problem List Patient Active Problem List   Diagnosis Date Noted  . 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 Left rotator cuff tear 06/19/2017  . Shoulder pain 06/04/2017  . Vaginitis and vulvovaginitis 06/09/2014  . Personal history of colonic polyp - adenoma 03/03/2014  . History of diverticulitis 11/21/2013  . Hyperkalemia 09/03/2013  . Meningioma ([place]) 09/02/2013  . 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 Screening examination for infectious disease 01/04/2012  . Asymptomatic postmenopausal status 03/03/2010  . Diabetes ([place]) 04/22/2008  . PURE HYPERCHOLESTEROLEMIA 09/27/2007    Sumner Boast., PT 09/11/2017, 11:40 AM  [place] [address] [place], [place], [address] Phone: [phone]   Fax:   [phone]  Name: MITRA DULING MRN: [id number] Date of Birth: [birth year]

## 2017-09-18 ENCOUNTER — Ambulatory Visit: Payer: Medicare Other | Admitting: Physical Therapy

## 2017-09-18 ENCOUNTER — Encounter: Payer: Self-pay | Admitting: Physical Therapy

## 2017-09-18 DIAGNOSIS — M25612 Stiffness of left shoulder, not elsewhere classified: Secondary | ICD-10-CM | POA: Diagnosis not present

## 2017-09-18 DIAGNOSIS — M25512 Pain in left shoulder: Secondary | ICD-10-CM

## 2017-09-18 NOTE — Therapy (Signed)
[place], [place], [address] Phone: [phone]   Fax:  [phone]  Physical Therapy Treatment  Patient Details  Name: Mia Newman MRN: [id number] Date of Birth: 02/18/1946 Referring Provider: Creig Hines   Encounter Date: 09/18/2017  PT End of Session - 09/18/17 1640    Visit Number  2    Date for PT Re-Evaluation  11/11/17    PT Start Time  1600    PT Stop Time  1640    PT Time Calculation (min)  40 min       Past Medical History:  Diagnosis Date  . Allergy   . Arthritis   . ASYMPTOMATIC POSTMENOPAUSAL STATUS 03/03/2010  . DIVERTICULITIS-COLON 05/22/2008  . DIVERTICULOSIS, COLON 05/20/2008  . DM 04/22/2008   borderline  . Glaucoma   . Hyperthyroidism   . Irritable bowel syndrome   . Personal history of colonic polyp - adenoma 03/03/2014  . Pure hypercholesterolemia 09/27/2007  . Unspecified hypothyroidism 04/22/2008    Past Surgical History:  Procedure Laterality Date  . ABDOMINAL HYSTERECTOMY  1987 apx  . 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 BREAST SURGERY    . COLONOSCOPY    . TUBAL LIGATION    . VSD REPAIR  1996    There were no vitals filed for this visit.  Subjective Assessment - 09/18/17 1604    Subjective  Pt reports that she feels a little bit better, compliant with HEP    Currently in Pain?  No/denies    Pain Score  0-No pain                      [facility] Adult PT Treatment/Exercise - 09/18/17 0001      Exercises   Exercises  Shoulder      Shoulder Exercises: Standing   External Rotation  Left;10 reps;Theraband x2    Theraband Level (Shoulder External Rotation)  Level 1 (Yellow)    Internal Rotation  Left;10 reps;Theraband    Theraband Level (Shoulder Internal Rotation)  Level 1 (Yellow)    Flexion  10 reps;Both;AROM    ABduction  AROM;10 reps;Both    Extension  10 reps;Theraband x2    Theraband Level (Shoulder  Extension)  Level 1 (Yellow)    Row  10 reps;Theraband;Both x2    Theraband Level (Shoulder Row)  Level 1 (Yellow)    Other Standing Exercises  Bicept curls 2lb 2x10, circles againt wall x10 each way    Other Standing Exercises  shrugs 3lb 2x10; flexion up wall 1lb and pillow case 2x10       Manual Therapy   Manual Therapy  Passive ROM    Passive ROM  L shoulder all directions                PT Short Term Goals - 09/11/17 1131      PT SHORT TERM GOAL #1   Title  independent with initial HEP    Time  1    Period  Weeks    Status  New        PT Long Term Goals - 09/11/17 1132      PT LONG TERM GOAL #1   Title  understand posture and body mechanics    Time  8    Period  Weeks    Status  New      PT LONG TERM GOAL #2  Title  decrease pain 50%    Period  Weeks    Status  New      PT LONG TERM GOAL #3   Title  increase IR to 50 degrees    Time  8    Period  Weeks    Status  New      PT LONG TERM GOAL #4   Title  report no difficulty with dressing and doing hair    Time  8    Period  Weeks    Status  New            Plan - 09/18/17 1640    Clinical Impression Statement  Pt able to perform all of today's exercises, she does reports some pain with active shoulder flexion. Initially she reports some pain with flexion, scaption, and ER during MT at the end ranges as MT progressed ROM improved and pain decreased.    Rehab Potential  Good    PT Frequency  2x / week    PT Duration  8 weeks    PT Treatment/Interventions  ADLs/Self Care Home Management;Cryotherapy;Electrical Stimulation;Iontophoresis 4mg /ml Dexamethasone;Moist Heat;Therapeutic exercise;Therapeutic activities;Patient/family education;Manual techniques    PT Next Visit Plan  Slowly add exercises,       Patient will benefit from skilled therapeutic intervention in order to improve the following deficits and impairments:  Decreased range of motion, Increased muscle spasms, Pain, Improper body  mechanics, Postural dysfunction, Decreased strength  Visit Diagnosis: Stiffness of left shoulder, not elsewhere classified  Acute pain of left shoulder     Problem List Patient Active Problem List   Diagnosis Date Noted  . 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 Left rotator cuff tear 06/19/2017  . Shoulder pain 06/04/2017  . Vaginitis and vulvovaginitis 06/09/2014  . Personal history of colonic polyp - adenoma 03/03/2014  . History of diverticulitis 11/21/2013  . Hyperkalemia 09/03/2013  . Meningioma ([place]) 09/02/2013  . 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 Screening examination for infectious disease 01/04/2012  . Asymptomatic postmenopausal status 03/03/2010  . Diabetes ([place]) 04/22/2008  . PURE HYPERCHOLESTEROLEMIA 09/27/2007    Scot Jun, PTA 09/18/2017, 4:43 PM  [place] [address] [place], [place], [address] Phone: [phone]   Fax:  [phone]  Name: Mia Newman MRN: [id number] Date of Birth: [birth year]

## 2017-09-21 ENCOUNTER — Encounter: Payer: Self-pay | Admitting: Physical Therapy

## 2017-09-21 ENCOUNTER — Ambulatory Visit: Payer: Medicare Other | Admitting: Physical Therapy

## 2017-09-21 DIAGNOSIS — M25612 Stiffness of left shoulder, not elsewhere classified: Secondary | ICD-10-CM | POA: Diagnosis not present

## 2017-09-21 DIAGNOSIS — M25512 Pain in left shoulder: Secondary | ICD-10-CM | POA: Diagnosis not present

## 2017-09-21 NOTE — Therapy (Signed)
[place], [place], [address] Phone: [phone]   Fax:  [phone]  Physical Therapy Treatment  Patient Details  Name: Mia Newman MRN: [id number] Date of Birth: 08/15/1946 Referring Provider: Creig Hines   Encounter Date: 09/21/2017  PT End of Session - 09/21/17 1126    Visit Number  3    Date for PT Re-Evaluation  11/11/17    PT Start Time  2500    PT Stop Time  1140    PT Time Calculation (min)  48 min       Past Medical History:  Diagnosis Date  . Allergy   . Arthritis   . ASYMPTOMATIC POSTMENOPAUSAL STATUS 03/03/2010  . DIVERTICULITIS-COLON 05/22/2008  . DIVERTICULOSIS, COLON 05/20/2008  . DM 04/22/2008   borderline  . Glaucoma   . Hyperthyroidism   . Irritable bowel syndrome   . Personal history of colonic polyp - adenoma 03/03/2014  . Pure hypercholesterolemia 09/27/2007  . Unspecified hypothyroidism 04/22/2008    Past Surgical History:  Procedure Laterality Date  . ABDOMINAL HYSTERECTOMY  1987 apx  . 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 BREAST SURGERY    . COLONOSCOPY    . TUBAL LIGATION    . VSD REPAIR  1996    There were no vitals filed for this visit.  Subjective Assessment - 09/21/17 1047    Subjective  doing pretty well, only pain with certain mvmts    Currently in Pain?  Yes    Pain Score  2     Pain Location  Shoulder    Pain Orientation  Left         [facility] PT Assessment - 09/21/17 0001      AROM   AROM Assessment Site  Shoulder    Right/Left Shoulder  Left    Left Shoulder Flexion  155 Degrees    Left Shoulder ABduction  155 Degrees    Left Shoulder Internal Rotation  45 Degrees    Left Shoulder External Rotation  85 Degrees                  [facility] Adult PT Treatment/Exercise - 09/21/17 0001      Shoulder Exercises: Standing   External Rotation  Left;Theraband;15 reps    Theraband Level (Shoulder  External Rotation)  Level 1 (Yellow)    ABduction  Strengthening;15 reps;Theraband    Theraband Level (Shoulder ABduction)  Level 1 (Yellow)    Extension  Theraband;15 reps    Theraband Level (Shoulder Extension)  Level 1 (Yellow)    Row  Theraband;Both;15 reps    Theraband Level (Shoulder Row)  Level 1 (Yellow)    Other Standing Exercises  ball vs wall 5CC and CCW cabinet reaching flex and abd 2# 8 each    Other Standing Exercises  4# shruggs,rolls and retraction 15 times PNF and empty can 2# 2 sets 10      Shoulder Exercises: ROM/Strengthening   UBE (Upper Arm Bike)  L 2 3 fwd/3 back      Modalities   Modalities  Cryotherapy      Cryotherapy   Number Minutes Cryotherapy  10 Minutes    Cryotherapy Location  Shoulder    Type of Cryotherapy  Ice pack      Manual Therapy   Manual Therapy  Passive ROM    Manual therapy comments  end range tightness and pain noted  Passive ROM  L shoulder all directions                PT Short Term Goals - 09/21/17 1127      PT SHORT TERM GOAL #1   Title  independent with initial HEP    Status  Achieved        PT Long Term Goals - 09/11/17 1132      PT LONG TERM GOAL #1   Title  understand posture and body mechanics    Time  8    Period  Weeks    Status  New      PT LONG TERM GOAL #2   Title  decrease pain 50%    Period  Weeks    Status  New      PT LONG TERM GOAL #3   Title  increase IR to 50 degrees    Time  8    Period  Weeks    Status  New      PT LONG TERM GOAL #4   Title  report no difficulty with dressing and doing hair    Time  8    Period  Weeks    Status  New            Plan - 09/21/17 1130    Clinical Impression Statement  c/o pain with end range ther ex and ROM. cuing needed for posture and control of ex. STG met. excellent increase in ROM    PT Treatment/Interventions  ADLs/Self Care Home Management;Cryotherapy;Electrical Stimulation;Iontophoresis '4mg'$ /ml Dexamethasone;Moist Heat;Therapeutic  exercise;Therapeutic activities;Patient/family education;Manual techniques    PT Next Visit Plan  increase HEP       Patient will benefit from skilled therapeutic intervention in order to improve the following deficits and impairments:  Decreased range of motion, Increased muscle spasms, Pain, Improper body mechanics, Postural dysfunction, Decreased strength  Visit Diagnosis: Stiffness of left shoulder, not elsewhere classified  Acute pain of left shoulder     Problem List Patient Active Problem List   Diagnosis Date Noted  . 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 Left rotator cuff tear 06/19/2017  . Shoulder pain 06/04/2017  . Vaginitis and vulvovaginitis 06/09/2014  . Personal history of colonic polyp - adenoma 03/03/2014  . History of diverticulitis 11/21/2013  . Hyperkalemia 09/03/2013  . Meningioma ([place]) 09/02/2013  . 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 Screening examination for infectious disease 01/04/2012  . Asymptomatic postmenopausal status 03/03/2010  . Diabetes ([place]) 04/22/2008  . PURE HYPERCHOLESTEROLEMIA 09/27/2007    Mia Newman,Mia Newman 09/21/2017, 11:31 AM  [place] [address] [place], [place], [address] Phone: [phone]   Fax:  [phone]  Name: Mia Newman MRN: [id number] Date of Birth: April 03, 1946

## 2017-09-25 ENCOUNTER — Ambulatory Visit: Payer: Medicare Other | Attending: Family Medicine | Admitting: Physical Therapy

## 2017-09-25 ENCOUNTER — Ambulatory Visit: Payer: Medicare Other | Admitting: Family Medicine

## 2017-09-25 ENCOUNTER — Encounter: Payer: Self-pay | Admitting: Physical Therapy

## 2017-09-25 DIAGNOSIS — M25612 Stiffness of left shoulder, not elsewhere classified: Secondary | ICD-10-CM | POA: Diagnosis not present

## 2017-09-25 DIAGNOSIS — M25512 Pain in left shoulder: Secondary | ICD-10-CM | POA: Insufficient documentation

## 2017-09-25 NOTE — Therapy (Signed)
[place], [place], [address] Phone: [phone]   Fax:  [phone]  Physical Therapy Treatment  Patient Details  Name: Mia Newman MRN: [id number] Date of Birth: December 15, 1945 Referring Provider: Creig Hines   Encounter Date: 09/25/2017  PT End of Session - 09/25/17 1128    Visit Number  4    Date for PT Re-Evaluation  11/11/17    PT Start Time  1055    PT Stop Time  1140    PT Time Calculation (min)  45 min       Past Medical History:  Diagnosis Date  . Allergy   . Arthritis   . ASYMPTOMATIC POSTMENOPAUSAL STATUS 03/03/2010  . DIVERTICULITIS-COLON 05/22/2008  . DIVERTICULOSIS, COLON 05/20/2008  . DM 04/22/2008   borderline  . Glaucoma   . Hyperthyroidism   . Irritable bowel syndrome   . Personal history of colonic polyp - adenoma 03/03/2014  . Pure hypercholesterolemia 09/27/2007  . Unspecified hypothyroidism 04/22/2008    Past Surgical History:  Procedure Laterality Date  . ABDOMINAL HYSTERECTOMY  1987 apx  . 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 BREAST SURGERY    . COLONOSCOPY    . TUBAL LIGATION    . VSD REPAIR  1996    There were no vitals filed for this visit.  Subjective Assessment - 09/25/17 1055    Subjective  alittel sore but better mvmt, " i can make RT turns now without any problems"    Currently in Pain?  Yes    Pain Score  2     Pain Descriptors / Indicators  Aching         [facility] PT Assessment - 09/25/17 0001      AROM   AROM Assessment Site  Shoulder    Right/Left Shoulder  Left    Left Shoulder Flexion  162 Degrees    Left Shoulder Internal Rotation  65 Degrees                  [facility] Adult PT Treatment/Exercise - 09/25/17 0001      Shoulder Exercises: Seated   Other Seated Exercises  lat pull and seated row 2 sets 10 15#      Shoulder Exercises: Standing   External Rotation  Theraband;15 reps;Both    Theraband Level (Shoulder External Rotation)  Level 2 (Red)    Flexion  Both;Weights;20 reps    Shoulder Flexion Weight (lbs)  3    ABduction  Both;20 reps;Weights 3#    Extension  Theraband;15 reps    Theraband Level (Shoulder Extension)  Level 2 (Red)    Row  Theraband;Both;15 reps    Theraband Level (Shoulder Row)  Level 2 (Red)    Other Standing Exercises  empty can and PNF Left UE Red tband cane ex ext and IR 15 each 3#    Other Standing Exercises  ball vs wall 5 times CC and CCW      Shoulder Exercises: Pulleys   Other Pulley Exercises  5# IR/ER 2 sets 10 each      Shoulder Exercises: ROM/Strengthening   UBE (Upper Arm Bike)  L 3 3 fwd/3 back      Cryotherapy   Number Minutes Cryotherapy  10 Minutes    Cryotherapy Location  Shoulder    Type of Cryotherapy  Ice pack      Manual Therapy   Manual Therapy  Passive ROM    Manual therapy comments  end range tightness but no pain IR and Abd    Passive ROM  L shoulder all directions                PT Short Term Goals - 09/21/17 1127      PT SHORT TERM GOAL #1   Title  independent with initial HEP    Status  Achieved        PT Long Term Goals - 09/25/17 1128      PT LONG TERM GOAL #1   Title  understand posture and body mechanics    Status  Partially Met      PT LONG TERM GOAL #2   Title  decrease pain 50%    Baseline  80%    Status  Achieved      PT LONG TERM GOAL #3   Title  increase IR to 50 degrees    Status  Achieved      PT LONG TERM GOAL #4   Title  report no difficulty with dressing and doing hair    Status  Partially Met            Plan - 09/25/17 1129    Clinical Impression Statement  pt making excellent progress in ROM and goals    PT Treatment/Interventions  ADLs/Self Care Home Management;Cryotherapy;Electrical Stimulation;Iontophoresis '4mg'$ /ml Dexamethasone;Moist Heat;Therapeutic exercise;Therapeutic activities;Patient/family education;Manual techniques    PT Next Visit Plan   increase HEP and HOLD 2 weeks       Patient will benefit from skilled therapeutic intervention in order to improve the following deficits and impairments:  Decreased range of motion, Increased muscle spasms, Pain, Improper body mechanics, Postural dysfunction, Decreased strength  Visit Diagnosis: Stiffness of left shoulder, not elsewhere classified  Acute pain of left shoulder     Problem List Patient Active Problem List   Diagnosis Date Noted  . 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 Left rotator cuff tear 06/19/2017  . Shoulder pain 06/04/2017  . Vaginitis and vulvovaginitis 06/09/2014  . Personal history of colonic polyp - adenoma 03/03/2014  . History of diverticulitis 11/21/2013  . Hyperkalemia 09/03/2013  . Meningioma ([place]) 09/02/2013  . 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 Screening examination for infectious disease 01/04/2012  . Asymptomatic postmenopausal status 03/03/2010  . Diabetes ([place]) 04/22/2008  . PURE HYPERCHOLESTEROLEMIA 09/27/2007    Lyan Moyano,ANGIE PTA 09/25/2017, 11:30 AM  [place] [address] [place], [place], [address] Phone: [phone]   Fax:  [phone]  Name: MARYANNE HUNEYCUTT MRN: [id number] Date of Birth: Jan 30, 1946

## 2017-09-28 ENCOUNTER — Ambulatory Visit: Payer: Medicare Other | Admitting: Physical Therapy

## 2017-09-28 DIAGNOSIS — M25612 Stiffness of left shoulder, not elsewhere classified: Secondary | ICD-10-CM | POA: Diagnosis not present

## 2017-09-28 DIAGNOSIS — M25512 Pain in left shoulder: Secondary | ICD-10-CM

## 2017-09-28 NOTE — Therapy (Addendum)
[place], [place], [address] Phone: [phone]   Fax:  [phone]  Physical Therapy Treatment  Patient Details  Name: Mia Newman MRN: [id number] Date of Birth: 06/06/1946 Referring Provider: Creig Hines   Encounter Date: 09/28/2017  PT End of Session - 09/28/17 1119    Visit Number  5    Date for PT Re-Evaluation  11/11/17    PT Start Time  1100    PT Stop Time  1130    PT Time Calculation (min)  30 min       Past Medical History:  Diagnosis Date  . Allergy   . Arthritis   . ASYMPTOMATIC POSTMENOPAUSAL STATUS 03/03/2010  . DIVERTICULITIS-COLON 05/22/2008  . DIVERTICULOSIS, COLON 05/20/2008  . DM 04/22/2008   borderline  . Glaucoma   . Hyperthyroidism   . Irritable bowel syndrome   . Personal history of colonic polyp - adenoma 03/03/2014  . Pure hypercholesterolemia 09/27/2007  . Unspecified hypothyroidism 04/22/2008    Past Surgical History:  Procedure Laterality Date  . ABDOMINAL HYSTERECTOMY  1987 apx  . 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 BREAST SURGERY    . COLONOSCOPY    . TUBAL LIGATION    . VSD REPAIR  1996    There were no vitals filed for this visit.  Subjective Assessment - 09/28/17 1116    Subjective  doing great- overall 80% better    Currently in Pain?  No/denies                      [facility] Adult PT Treatment/Exercise - 09/28/17 0001      Shoulder Exercises: Seated   Other Seated Exercises  lat pull and seated row 2 sets 10 15#      Shoulder Exercises: ROM/Strengthening   UBE (Upper Arm Bike)  L 3 3 fwd/3 back             PT Education - 09/28/17 1117    Education Details  red tband to established HEP plus added [place] 5 ex    Person(s) Educated  Patient    Methods  Explanation;Demonstration;Handout    Comprehension  Verbalized understanding;Returned demonstration       PT Short Term Goals -  09/21/17 1127      PT SHORT TERM GOAL #1   Title  independent with initial HEP    Status  Achieved        PT Long Term Goals - 09/28/17 1118      PT LONG TERM GOAL #1   Title  understand posture and body mechanics    Status  Partially Met      PT LONG TERM GOAL #4   Title  report no difficulty with dressing and doing hair    Status  Achieved            Plan - 09/28/17 1119    Clinical Impression Statement  all goals met, excellent increase in ROM, HEP issued. working on [employer] and ADLs much better. all goals met except #1 partially met    PT Treatment/Interventions  ADLs/Self Care Home Management;Cryotherapy;Electrical Stimulation;Iontophoresis '4mg'$ /ml Dexamethasone;Moist Heat;Therapeutic exercise;Therapeutic activities;Patient/family education;Manual techniques    PT Next Visit Plan  HOLD 2 weeks if no pt f/u D/C       Patient will benefit from skilled therapeutic intervention in order to improve the following deficits and impairments:  Decreased  range of motion, Increased muscle spasms, Pain, Improper body mechanics, Postural dysfunction, Decreased strength  Visit Diagnosis: Stiffness of left shoulder, not elsewhere classified  Acute pain of left shoulder     Problem List Patient Active Problem List   Diagnosis Date Noted  . 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 Left rotator cuff tear 06/19/2017  . Shoulder pain 06/04/2017  . Vaginitis and vulvovaginitis 06/09/2014  . Personal history of colonic polyp - adenoma 03/03/2014  . History of diverticulitis 11/21/2013  . Hyperkalemia 09/03/2013  . Meningioma ([place]) 09/02/2013  . 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 Screening examination for infectious disease 01/04/2012  . Asymptomatic postmenopausal status 03/03/2010  . Diabetes ([place]) 04/22/2008  . PURE HYPERCHOLESTEROLEMIA 09/27/2007   PHYSICAL THERAPY DISCHARGE SUMMARY   Plan: Patient agrees to discharge.  Patient goals were  partially met. Patient is being discharged due to being pleased with the current functional level.  ?????      Sion Thane,ANGIE PTA 09/28/2017, 11:22 AM  [place], [place], [address] Phone: [phone]   Fax:  [phone]  Name: Mia Newman MRN: [id number] Date of Birth: 10/11/1946

## 2017-12-10 DIAGNOSIS — H47293 Other optic atrophy, bilateral: Secondary | ICD-10-CM | POA: Diagnosis not present

## 2017-12-13 ENCOUNTER — Other Ambulatory Visit: Payer: Self-pay | Admitting: Endocrinology

## 2018-03-11 DIAGNOSIS — H47293 Other optic atrophy, bilateral: Secondary | ICD-10-CM | POA: Diagnosis not present

## 2018-03-11 DIAGNOSIS — H401232 Low-tension glaucoma, bilateral, moderate stage: Secondary | ICD-10-CM | POA: Diagnosis not present

## 2018-06-10 DIAGNOSIS — H401232 Low-tension glaucoma, bilateral, moderate stage: Secondary | ICD-10-CM | POA: Diagnosis not present

## 2018-06-10 DIAGNOSIS — H47293 Other optic atrophy, bilateral: Secondary | ICD-10-CM | POA: Diagnosis not present

## 2018-06-20 ENCOUNTER — Telehealth: Payer: Self-pay | Admitting: Endocrinology

## 2018-06-20 NOTE — Telephone Encounter (Signed)
Patient is returning call. Please call patient at ph# [phone].  Also, patient said she received a recording stating that she needs to get blood work to see if she has diabetes, blood level check, maybe thyroid, and other things-please call patient at the above ph# to advise.  If no answer please leave a detailed message on voice mail.

## 2018-06-30 ENCOUNTER — Other Ambulatory Visit: Payer: Self-pay | Admitting: Endocrinology

## 2018-06-30 NOTE — Telephone Encounter (Signed)
Please refill x 1 Ov is due  

## 2018-08-05 ENCOUNTER — Other Ambulatory Visit: Payer: Self-pay | Admitting: Endocrinology

## 2018-08-07 ENCOUNTER — Encounter: Payer: Self-pay | Admitting: Endocrinology

## 2018-08-07 ENCOUNTER — Telehealth: Payer: Self-pay | Admitting: Endocrinology

## 2018-08-07 ENCOUNTER — Ambulatory Visit (INDEPENDENT_AMBULATORY_CARE_PROVIDER_SITE_OTHER): Payer: Medicare Other | Admitting: Endocrinology

## 2018-08-07 VITALS — BP 128/62 | HR 74 | Ht 61.0 in | Wt 134.2 lb

## 2018-08-07 DIAGNOSIS — E7800 Pure hypercholesterolemia, unspecified: Secondary | ICD-10-CM | POA: Diagnosis not present

## 2018-08-07 DIAGNOSIS — R079 Chest pain, unspecified: Secondary | ICD-10-CM | POA: Diagnosis not present

## 2018-08-07 DIAGNOSIS — E890 Postprocedural hypothyroidism: Secondary | ICD-10-CM | POA: Diagnosis not present

## 2018-08-07 DIAGNOSIS — Z0000 Encounter for general adult medical examination without abnormal findings: Secondary | ICD-10-CM | POA: Diagnosis not present

## 2018-08-07 DIAGNOSIS — E119 Type 2 diabetes mellitus without complications: Secondary | ICD-10-CM | POA: Diagnosis not present

## 2018-08-07 DIAGNOSIS — R739 Hyperglycemia, unspecified: Secondary | ICD-10-CM | POA: Diagnosis not present

## 2018-08-07 DIAGNOSIS — Z1239 Encounter for other screening for malignant neoplasm of breast: Secondary | ICD-10-CM

## 2018-08-07 DIAGNOSIS — E039 Hypothyroidism, unspecified: Secondary | ICD-10-CM

## 2018-08-07 LAB — URINALYSIS, ROUTINE W REFLEX MICROSCOPIC
Bilirubin Urine: NEGATIVE
Hgb urine dipstick: NEGATIVE
KETONES UR: NEGATIVE
Nitrite: NEGATIVE
RBC / HPF: NONE SEEN (ref 0–?)
Total Protein, Urine: NEGATIVE
Urine Glucose: NEGATIVE
Urobilinogen, UA: 0.2 (ref 0.0–1.0)
pH: 7 (ref 5.0–8.0)

## 2018-08-07 LAB — BASIC METABOLIC PANEL
BUN: 8 mg/dL (ref 6–23)
CALCIUM: 9.4 mg/dL (ref 8.4–10.5)
CO2: 31 mEq/L (ref 19–32)
CREATININE: 0.74 mg/dL (ref 0.40–1.20)
Chloride: 103 mEq/L (ref 96–112)
GFR: 82.08 mL/min (ref >60.00)
GLUCOSE: 95 mg/dL (ref 70–99)
Potassium: 4 mEq/L (ref 3.5–5.1)
Sodium: 140 mEq/L (ref 135–145)

## 2018-08-07 LAB — CBC WITH DIFFERENTIAL/PLATELET
BASOS PCT: 1 % (ref 0.0–3.0)
Basophils Absolute: 0.1 10*3/uL (ref 0.0–0.1)
Eosinophils Absolute: 0.2 10*3/uL (ref 0.0–0.7)
Eosinophils Relative: 3.2 % (ref 0.0–5.0)
HEMATOCRIT: 44.9 % (ref 36.0–46.0)
HEMOGLOBIN: 14.7 g/dL (ref 12.0–15.0)
Lymphocytes Relative: 29.6 % (ref 12.0–46.0)
Lymphs Abs: 1.7 10*3/uL (ref 0.7–4.0)
MCHC: 32.8 g/dL (ref 30.0–36.0)
MCV: 93.8 fl (ref 78.0–100.0)
Monocytes Absolute: 0.5 10*3/uL (ref 0.1–1.0)
Monocytes Relative: 8.1 % (ref 3.0–12.0)
Neutro Abs: 3.2 10*3/uL (ref 1.4–7.7)
Neutrophils Relative %: 58.1 % (ref 43.0–77.0)
Platelets: 281 10*3/uL (ref 150.0–400.0)
RBC: 4.79 Mil/uL (ref 3.87–5.11)
RDW: 14.4 % (ref 11.5–15.5)
WBC: 5.6 10*3/uL (ref 4.0–10.5)

## 2018-08-07 LAB — LIPID PANEL
CHOL/HDL RATIO: 7
CHOLESTEROL: 284 mg/dL — AB (ref 0–200)
HDL: 43.1 mg/dL (ref >39.00)
NonHDL: 241.39
TRIGLYCERIDES: 260 mg/dL — AB (ref 0.0–149.0)
VLDL: 52 mg/dL — ABNORMAL HIGH (ref 0.0–40.0)

## 2018-08-07 LAB — HEPATIC FUNCTION PANEL
ALT: 13 U/L (ref 0–35)
AST: 13 U/L (ref 0–37)
Albumin: 4.3 g/dL (ref 3.5–5.2)
Alkaline Phosphatase: 44 U/L (ref 39–117)
BILIRUBIN DIRECT: 0 mg/dL (ref 0.0–0.3)
BILIRUBIN TOTAL: 0.4 mg/dL (ref 0.2–1.2)
Total Protein: 6.8 g/dL (ref 6.0–8.3)

## 2018-08-07 LAB — TSH: TSH: 11.56 u[IU]/mL — ABNORMAL HIGH (ref 0.35–4.50)

## 2018-08-07 LAB — POCT GLYCOSYLATED HEMOGLOBIN (HGB A1C): HEMOGLOBIN A1C: 5.4 % (ref 4.0–5.6)

## 2018-08-07 LAB — LDL CHOLESTEROL, DIRECT: Direct LDL: 209 mg/dL

## 2018-08-07 MED ORDER — LEVOTHYROXINE SODIUM 50 MCG PO TABS: 50.0000 ug | ORAL_TABLET | Freq: Every day | ORAL | 3 refills | Status: DC

## 2018-08-07 NOTE — Patient Instructions (Addendum)
Please see a heart specialist.  you will receive a phone call, about a day and time for an appointment. Please call the [facility], to schedule a mammogram. good diet and exercise significantly improve your health.  please let me know if you wish to be referred to a dietician.  high blood sugar is very risky to your health.  you should see an eye doctor and dentist every year.  It is very important to get all recommended vaccinations.  Please consider these measures for your health:  minimize alcohol.  Do not use tobacco products.  Have a colonoscopy at least every 10 years from age 44.  Women should have an annual mammogram from age 6.  Keep firearms safely stored.  Always use seat belts.  have working smoke alarms in your home.  See an eye doctor and dentist regularly.  Never drive under the influence of alcohol or drugs (including prescription drugs).  Those with fair skin should take precautions against the sun, and should carefully examine their skin once per month, for any new or changed moles. It is critically important to prevent falling down (keep floor areas well-lit, dry, and free of loose objects.  If you have a cane, walker, or wheelchair, you should use it, even for short trips around the house.  Wear flat-soled shoes.  Also, try not to rush).  Best wishes with your new primary care provider. Please come back for a follow-up appointment in 1 year.

## 2018-08-07 NOTE — Progress Notes (Signed)
we discussed code status.  pt requests full code, but would not want to be started or maintained on artificial life-support measures if there was not a reasonable chance of recovery 

## 2018-08-07 NOTE — Progress Notes (Signed)
Subjective:    Patient ID: Mia Newman, female    DOB: 09-10-1946, 72 y.o.   MRN: [id number]  HPI Pt says a few days ago, she had a few-minute episode of pain at the mid-chest, while walking.  No assoc sob.  She has been off synthroid x 1 week.   Past Medical History:  Diagnosis Date  . Allergy   . Arthritis   . ASYMPTOMATIC POSTMENOPAUSAL STATUS 03/03/2010  . DIVERTICULITIS-COLON 05/22/2008  . DIVERTICULOSIS, COLON 05/20/2008  . DM 04/22/2008   borderline  . Glaucoma   . Hyperthyroidism   . Irritable bowel syndrome   . Personal history of colonic polyp - adenoma 03/03/2014  . Pure hypercholesterolemia 09/27/2007  . Unspecified hypothyroidism 04/22/2008    Past Surgical History:  Procedure Laterality Date  . ABDOMINAL HYSTERECTOMY  1987 apx  . 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 BREAST SURGERY    . COLONOSCOPY    . TUBAL LIGATION    . VSD REPAIR  1996    Social History   Socioeconomic History  . Marital status: Married    Spouse name: Not on file  . Number of children: Not on file  . Years of education: Not on file  . Highest education level: Not on file  Occupational History  . Occupation: Retired  Scientific laboratory technician  . Financial resource strain: Not on file  . Food insecurity:    Worry: Not on file    Inability: Not on file  . Transportation needs:    Medical: Not on file    Non-medical: Not on file  Tobacco Use  . Smoking status: Never Smoker  . Smokeless tobacco: Never Used  Substance and Sexual Activity  . Alcohol use: No  . Drug use: No  . Sexual activity: Yes    Birth control/protection: None  Lifestyle  . Physical activity:    Days per week: Not on file    Minutes per session: Not on file  . Stress: Not on file  Relationships  . Social connections:    Talks on phone: Not on file    Gets together: Not on file    Attends religious service: Not on file    Active member of club or organization: Not on file   Attends meetings of clubs or organizations: Not on file    Relationship status: Not on file  . Intimate partner violence:    Fear of current or ex partner: Not on file    Emotionally abused: Not on file    Physically abused: Not on file    Forced sexual activity: Not on file  Other Topics Concern  . Not on file  Social History Narrative  . Not on file    Current Outpatient Medications on File Prior to Visit  Medication Sig Dispense Refill  . acetaminophen (TYLENOL) 500 MG tablet Take 500 mg by mouth 2 (two) times daily.      Marland Kitchen latanoprost (XALATAN) 0.005 % ophthalmic solution Place 1 drop into both eyes at bedtime.       No current facility-administered medications on file prior to visit.     Allergies  Allergen Reactions  . Atorvastatin     REACTION: myalgias  . Raloxifene     REACTION: unspecified  . Simvastatin     REACTION: myalgias (pt perceives due to zocor)  . Sulfamethoxazole     REACTION: unspecified    Family History  Problem Relation Age  of Onset  . Heart attack Father 69  . Cancer Maternal Grandmother        Uterine Cancer  . Cancer Paternal Grandmother        Stomach Cancer  . Colon cancer Neg Hx     BP 128/62 (BP Location: Left Arm, Patient Position: Sitting, Cuff Size: Normal)   Pulse 74   Ht 5\' 1"  (1.549 m)   Wt 134 lb 3.2 oz (60.9 kg)   SpO2 97%   BMI 25.36 kg/m    Review of Systems Denies LOC and dry skin.     Objective:   Physical Exam VITAL SIGNS:  See vs page GENERAL: no distress LUNGS:  Clear to auscultation HEART:  Regular rate and rhythm without murmurs noted. Normal S1,S2.     I personally reviewed electrocardiogram tracing (today): Indication: chest pain Impression: NSR.  No MI. Poss LVH Compared to 2018: no significant change   Lab Results  Component Value Date   TSH 11.56 (H) 08/07/2018      Assessment & Plan:  Chest pain, new, uncertain etiology: ref cardiol Hypothyroidism, worse off rx: I have sent a  prescription to your pharmacy, to resume synthroid  Subjective:   Patient here for Medicare annual wellness visit and management of other chronic and acute problems.     Risk factors: advanced age    75 of Physicians Providing Medical Care to Patient:  See "snapshot"   Activities of Daily Living: In your present state of health, do you have any difficulty performing the following activities (lives with husband)?:  Preparing food and eating?: No  Bathing yourself: No  Getting dressed: No  Using the toilet:No  Moving around from place to place: No  In the past year have you fallen or had a near fall?: No    Home Safety: Has smoke detector and wears seat belts. Firearms are safely stored. No excess sun exposure.    Opioid Use: none   Diet and Exercise  Current exercise habits: pt says good Dietary issues discussed: pt reports a healthy diet   Depression Screen  Q1: Over the past two weeks, have you felt down, depressed or hopeless?no  Q2: Over the past two weeks, have you felt little interest or pleasure in doing things? no   The following portions of the patient's history were reviewed and updated as appropriate: allergies, current medications, past family history, past medical history, past social history, past surgical history and problem list.   Review of Systems  Denies hearing loss, and visual loss Objective:   Vision:  See [facility] done today Hearing: grossly normal Body mass index:  See vs page Msk: pt easily and quickly performs "get-up-and-go" from a sitting position Cognitive Impairment Assessment: cognition, memory and judgment appear normal.  remembers 3/3 at 5 minutes.  excellent recall.  can easily read and write a sentence.  alert and oriented x 3.     Assessment:   Medicare wellness utd on preventive parameters    Plan:   During the course of the visit the patient was educated and counseled about appropriate screening and preventive services including:         Fall prevention is advised today  Screening mammography is advised Bone densitometry screening is UTD Diabetes screening is done today Nutrition counseling is offered  advanced directives/end of life addressed today:  see healthcare directives hyperlink  Vaccines are updated as needed  Patient Instructions (the written plan) was given to the patient.

## 2018-08-07 NOTE — Telephone Encounter (Signed)
Jeneen Rinks (patient's husband) called re: patient was seen today by Dr. Loanne Drilling. The RX for Levothyroxine was sent to the wrong pharmacy [facility]). They need RX for Levothyroxine sent to [facility] on [address] (used to be [address])

## 2018-08-08 ENCOUNTER — Other Ambulatory Visit: Payer: Self-pay

## 2018-08-08 MED ORDER — LEVOTHYROXINE SODIUM 50 MCG PO TABS: 50.0000 ug | ORAL_TABLET | Freq: Every day | ORAL | 3 refills | Status: DC

## 2018-08-08 NOTE — Telephone Encounter (Signed)
Pharmacy has been changed in pt chart and rx sent to correct pharmacy

## 2018-08-09 ENCOUNTER — Telehealth: Payer: Self-pay

## 2018-08-09 ENCOUNTER — Telehealth: Payer: Self-pay | Admitting: Endocrinology

## 2018-08-09 NOTE — Telephone Encounter (Signed)
Due to our unsuccessful attempts to reach pt, a letter along with pt lab results has been mailed to pt home address.

## 2018-08-09 NOTE — Telephone Encounter (Signed)
-----   Message from Aleatha Borer, LPN sent at 17/71/1657  4:05 PM EDT ----- Called pt and informed of lab results, inquire about change in medication, new Rx orders and to inquire about urinary symptoms. LVM requesting returned call.

## 2018-08-09 NOTE — Telephone Encounter (Signed)
Second attempt to reach pt. LVM requesting returned call.

## 2018-08-09 NOTE — Telephone Encounter (Signed)
Pt called to get lab results Best number [phone]

## 2018-08-12 ENCOUNTER — Telehealth: Payer: Self-pay | Admitting: Endocrinology

## 2018-08-12 NOTE — Telephone Encounter (Signed)
Please call patient at ph# [phone] re: lab results

## 2018-08-12 NOTE — Telephone Encounter (Signed)
Please inform pt 4 attempts to contact her have been made to discuss lab results(including one this morning) so a letter has been mailed according to previous phone note

## 2018-08-12 NOTE — Telephone Encounter (Signed)
Results given.

## 2018-08-14 ENCOUNTER — Encounter: Payer: Self-pay | Admitting: Family

## 2018-08-14 ENCOUNTER — Ambulatory Visit (INDEPENDENT_AMBULATORY_CARE_PROVIDER_SITE_OTHER): Payer: Medicare Other | Admitting: Family

## 2018-08-14 VITALS — BP 118/68 | HR 64 | Temp 97.6°F | Ht 61.0 in | Wt 133.1 lb

## 2018-08-14 DIAGNOSIS — Z1231 Encounter for screening mammogram for malignant neoplasm of breast: Secondary | ICD-10-CM

## 2018-08-14 DIAGNOSIS — N813 Complete uterovaginal prolapse: Secondary | ICD-10-CM | POA: Diagnosis not present

## 2018-08-14 DIAGNOSIS — N8110 Cystocele, unspecified: Secondary | ICD-10-CM | POA: Diagnosis not present

## 2018-08-14 DIAGNOSIS — R7303 Prediabetes: Secondary | ICD-10-CM | POA: Diagnosis not present

## 2018-08-14 DIAGNOSIS — M549 Dorsalgia, unspecified: Secondary | ICD-10-CM | POA: Diagnosis not present

## 2018-08-14 DIAGNOSIS — E890 Postprocedural hypothyroidism: Secondary | ICD-10-CM | POA: Diagnosis not present

## 2018-08-14 NOTE — Progress Notes (Signed)
Mia Newman is a 72 y.o. female with the following history as recorded in EpicCare:  Patient Active Problem List   Diagnosis Date Noted  . Hyperglycemia 08/07/2018  . Chest pain 08/07/2018  . 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 Left rotator cuff tear 06/19/2017  . Shoulder pain 06/04/2017  . Vaginitis and vulvovaginitis 06/09/2014  . Personal history of colonic polyp - adenoma 03/03/2014  . History of diverticulitis 11/21/2013  . Hyperkalemia 09/03/2013  . Meningioma ([place]) 09/02/2013  . 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 Wellness examination 01/04/2012  . Asymptomatic postmenopausal status 03/03/2010  . PURE HYPERCHOLESTEROLEMIA 09/27/2007    Current Outpatient Medications  Medication Sig Dispense Refill  . acetaminophen (TYLENOL) 500 MG tablet Take 500 mg by mouth 2 (two) times daily.      Marland Kitchen latanoprost (XALATAN) 0.005 % ophthalmic solution Place 1 drop into both eyes at bedtime.      Marland Kitchen levothyroxine (SYNTHROID, LEVOTHROID) 50 MCG tablet Take 1 tablet (50 mcg total) by mouth daily. 90 tablet 3   No current facility-administered medications for this visit.     Allergies: Atorvastatin; Raloxifene; Simvastatin; and Sulfamethoxazole  Past Medical History:  Diagnosis Date  . Allergy   . Arthritis   . ASYMPTOMATIC POSTMENOPAUSAL STATUS 03/03/2010  . DIVERTICULITIS-COLON 05/22/2008  . DIVERTICULOSIS, COLON 05/20/2008  . DM 04/22/2008   borderline  . Glaucoma   . Hyperthyroidism   . Irritable bowel syndrome   . Personal history of colonic polyp - adenoma 03/03/2014  . Pure hypercholesterolemia 09/27/2007  . Unspecified hypothyroidism 04/22/2008    Past Surgical History:  Procedure Laterality Date  . ABDOMINAL HYSTERECTOMY  1987 apx  . 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 BREAST SURGERY    . COLONOSCOPY    . TUBAL LIGATION    . VSD REPAIR  1996    Family History  Problem Relation Age of Onset  . Heart  attack Father 5  . Cancer Maternal Grandmother        Uterine Cancer  . Cancer Paternal Grandmother        Stomach Cancer  . Colon cancer Neg Hx     Social History   Tobacco Use  . Smoking status: Never Smoker  . Smokeless tobacco: Never Used  Substance Use Topics  . Alcohol use: No    Subjective:  Patient presents to transfer care from Dr. Loanne Drilling; history of hypothyroidism- planning to stay with Dr. Loanne Drilling for care; last TSH was at 7- patient questioning dosage of Synthroid; she will contact him to make change; Has GYN-has pessary; agreeable to updating mammogram;  Colonoscopy due in 2020;  Defers vaccines at this time; Exercises regularly; just had CPE done with Dr. Loanne Drilling.   Objective:  Vitals:   08/14/18 1355  BP: 118/68  Pulse: 64  Temp: 97.6 F (36.4 C)  TempSrc: Oral  SpO2: 99%  Weight: 133 lb 1.9 oz (60.4 kg)  Height: 5\' 1"  (1.549 m)    General: Well developed, well nourished, in no acute distress  Skin : Warm and dry.  Head: Normocephalic and atraumatic  Eyes: Sclera and conjunctiva clear; pupils round and reactive to light; extraocular movements intact  Ears: External normal; canals clear; tympanic membranes normal  Oropharynx: Pink, supple. No suspicious lesions  Neck: Supple without thyromegaly, adenopathy  Lungs: Respirations unlabored; clear to auscultation bilaterally without wheeze, rales, rhonchi  CVS exam: normal rate and regular rhythm.  Musculoskeletal: No deformities; no active  joint inflammation  Extremities: No edema, cyanosis, clubbing  Vessels: Symmetric bilaterally  Neurologic: Alert and oriented; speech intact; face symmetrical; moves all extremities well; CNII-XII intact without focal deficit   Assessment:  1. Hypothyroidism following radioiodine therapy   2. Pre-diabetes   3. Screening mammogram, encounter for     Plan:  Planning to stay with her endocrinologist for management- contact him about recent abnormal TSH; ? Need to  increase to 75 mcg; Order for mammogram updated; Congratulated patient on commitment to her health;  No follow-ups on file.  Orders Placed This Encounter  Procedures  . MM Digital Screening    Standing Status:   Future    Standing Expiration Date:   10/15/2019    Order Specific Question:   Reason for Exam (SYMPTOM  OR DIAGNOSIS REQUIRED)    Answer:   screening mammogram    Order Specific Question:   Preferred imaging location?    Answer:   [facility]    Requested Prescriptions    No prescriptions requested or ordered in this encounter

## 2018-08-16 ENCOUNTER — Telehealth: Payer: Self-pay | Admitting: Endocrinology

## 2018-08-16 NOTE — Telephone Encounter (Signed)
Patient called stating they would like to speak to Ammie about lab results. Please Advise. Ph # [phone]

## 2018-08-19 ENCOUNTER — Telehealth: Payer: Self-pay | Admitting: Endocrinology

## 2018-08-19 MED ORDER — LEVOTHYROXINE SODIUM 75 MCG PO TABS: 75.0000 ug | ORAL_TABLET | Freq: Every day | ORAL | 3 refills | Status: DC

## 2018-08-19 NOTE — Telephone Encounter (Signed)
Ok, I have sent a prescription to your pharmacy, to increase to 75/day

## 2018-08-19 NOTE — Addendum Note (Signed)
Addended by: Renato Shin on: 08/19/2018 12:55 PM   Modules accepted: Orders

## 2018-08-19 NOTE — Telephone Encounter (Signed)
Pt husband informed

## 2018-08-19 NOTE — Telephone Encounter (Signed)
Patient is stating there PCP wants to know why Dr. Loanne Drilling didn't increase levothyroxine (SYNTHROID, LEVOTHROID) 50 MCG tablet to 75mg  at her last amount instead of just giving her extra tablets? Please Advise Ph # [phone]

## 2018-08-19 NOTE — Telephone Encounter (Signed)
Pt informed of lab results.  Cholesterol is high. Do you want to take a different med, called Zetia? No, prefers diet control rather than medication Thyroid is slightly low. I have sent a prescription to your pharmacy, to refill. Received separate notification from staff re: pt concern with medication sent to pharmacy. Refer to separate encounter and advise. You have a slight UTI. Please let me know if you have symptoms. Denies any s/sx of fever, chills, urinary frequency/urgency/burning with urination. Also denies any foul odor with urine as well as discolored urine. Advised to continue to increase fluid intake, approx 6-8 8oz water per day. Call if any changes in condition.

## 2018-08-19 NOTE — Telephone Encounter (Signed)
Please advise 

## 2018-08-26 DIAGNOSIS — H0014 Chalazion left upper eyelid: Secondary | ICD-10-CM | POA: Diagnosis not present

## 2018-09-09 DIAGNOSIS — H401231 Low-tension glaucoma, bilateral, mild stage: Secondary | ICD-10-CM | POA: Diagnosis not present

## 2018-09-16 ENCOUNTER — Ambulatory Visit
Admission: RE | Admit: 2018-09-16 | Discharge: 2018-09-16 | Disposition: A | Discharge status: Home / Self Care | Payer: Medicare Other | Source: Ambulatory Visit | Attending: Family | Admitting: Family

## 2018-09-16 DIAGNOSIS — Z1231 Encounter for screening mammogram for malignant neoplasm of breast: Secondary | ICD-10-CM | POA: Diagnosis not present

## 2018-09-18 ENCOUNTER — Encounter: Payer: Self-pay | Admitting: Family

## 2018-09-18 ENCOUNTER — Ambulatory Visit (INDEPENDENT_AMBULATORY_CARE_PROVIDER_SITE_OTHER): Payer: Medicare Other | Admitting: Family

## 2018-09-18 VITALS — BP 128/84 | HR 92 | Temp 97.8°F | Ht 61.0 in | Wt 131.0 lb

## 2018-09-18 DIAGNOSIS — H66001 Acute suppurative otitis media without spontaneous rupture of ear drum, right ear: Secondary | ICD-10-CM

## 2018-09-18 MED ORDER — CEFDINIR 300 MG PO CAPS: 300.0000 mg | ORAL_CAPSULE | Freq: Two times a day (BID) | ORAL | 0 refills | Status: DC

## 2018-09-18 MED ORDER — FLUTICASONE PROPIONATE 50 MCG/ACT NA SUSP: 2.0000 | Freq: Every day | NASAL | 6 refills | Status: DC

## 2018-09-18 MED ORDER — GUAIFENESIN-CODEINE 100-10 MG/5ML PO SOLN: 5.0000 mL | Freq: Four times a day (QID) | ORAL | 0 refills | Status: DC | PRN

## 2018-09-18 NOTE — Progress Notes (Signed)
Mia Newman is a 72 y.o. female with the following history as recorded in EpicCare:  Patient Active Problem List   Diagnosis Date Noted  . Hyperglycemia 08/07/2018  . Chest pain 08/07/2018  . 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 Left rotator cuff tear 06/19/2017  . Shoulder pain 06/04/2017  . Vaginitis and vulvovaginitis 06/09/2014  . Personal history of colonic polyp - adenoma 03/03/2014  . History of diverticulitis 11/21/2013  . Hyperkalemia 09/03/2013  . Meningioma ([place]) 09/02/2013  . 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 Wellness examination 01/04/2012  . Asymptomatic postmenopausal status 03/03/2010  . PURE HYPERCHOLESTEROLEMIA 09/27/2007    Current Outpatient Medications  Medication Sig Dispense Refill  . acetaminophen (TYLENOL) 500 MG tablet Take 500 mg by mouth 2 (two) times daily.      Marland Kitchen latanoprost (XALATAN) 0.005 % ophthalmic solution Place 1 drop into both eyes at bedtime.      Marland Kitchen levothyroxine (SYNTHROID, LEVOTHROID) 75 MCG tablet Take 1 tablet (75 mcg total) by mouth daily before breakfast. 90 tablet 3  . cefdinir (OMNICEF) 300 MG capsule Take 1 capsule (300 mg total) by mouth 2 (two) times daily. 20 capsule 0  . fluticasone (FLONASE) 50 MCG/ACT nasal spray Place 2 sprays into both nostrils daily. 16 g 6  . guaiFENesin-codeine 100-10 MG/5ML syrup Take 5 mLs by mouth every 6 (six) hours as needed for cough. 118 mL 0   No current facility-administered medications for this visit.     Allergies: Atorvastatin; Raloxifene; Simvastatin; and Sulfamethoxazole  Past Medical History:  Diagnosis Date  . Allergy   . Arthritis   . ASYMPTOMATIC POSTMENOPAUSAL STATUS 03/03/2010  . DIVERTICULITIS-COLON 05/22/2008  . DIVERTICULOSIS, COLON 05/20/2008  . DM 04/22/2008   borderline  . Glaucoma   . Hyperthyroidism   . Irritable bowel syndrome   . Personal history of colonic polyp - adenoma 03/03/2014  . Pure hypercholesterolemia 09/27/2007  .  Unspecified hypothyroidism 04/22/2008    Past Surgical History:  Procedure Laterality Date  . ABDOMINAL HYSTERECTOMY  1987 apx  . 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 BREAST SURGERY    . COLONOSCOPY    . TUBAL LIGATION    . VSD REPAIR  1996    Family History  Problem Relation Age of Onset  . Heart attack Father 50  . Cancer Maternal Grandmother        Uterine Cancer  . Cancer Paternal Grandmother        Stomach Cancer  . Colon cancer Neg Hx     Social History   Tobacco Use  . Smoking status: Never Smoker  . Smokeless tobacco: Never Used  Substance Use Topics  . Alcohol use: No    Subjective:  Patient presents with concerns for sinus infection; + cough; no chest pain, pressure; using OTC Mucinex DM;  Husband sick with similar symptoms; + facial pain, pressure;     Objective:  Vitals:   09/18/18 1343  BP: 128/84  Pulse: 92  Temp: 97.8 F (36.6 C)  TempSrc: Oral  SpO2: 98%  Weight: 131 lb 0.6 oz (59.4 kg)  Height: 5\' 1"  (1.549 m)    General: Well developed, well nourished, in no acute distress  Skin : Warm and dry.  Head: Normocephalic and atraumatic  Eyes: Sclera and conjunctiva clear; pupils round and reactive to light; extraocular movements intact  Ears: External normal; canals clear; right tympanic membrane erythematous Oropharynx: Pink, supple. No suspicious lesions  Neck:  Supple without thyromegaly, adenopathy  Lungs: Respirations unlabored; clear to auscultation bilaterally without wheeze, rales, rhonchi  CVS exam: normal rate and regular rhythm.  Neurologic: Alert and oriented; speech intact; face symmetrical; moves all extremities well; CNII-XII intact without focal deficit   Assessment:  1. Non-recurrent acute suppurative otitis media of right ear without spontaneous rupture of tympanic membrane     Plan:  Rx for Omnicef 300 mg bid x 10 days, Flonase and Robitussin AC; increase fluids, rest and follow-up worse, no  better.    No follow-ups on file.  No orders of the defined types were placed in this encounter.   Requested Prescriptions   Signed Prescriptions Disp Refills  . cefdinir (OMNICEF) 300 MG capsule 20 capsule 0    Sig: Take 1 capsule (300 mg total) by mouth 2 (two) times daily.  . fluticasone (FLONASE) 50 MCG/ACT nasal spray 16 g 6    Sig: Place 2 sprays into both nostrils daily.  Marland Kitchen guaiFENesin-codeine 100-10 MG/5ML syrup 118 mL 0    Sig: Take 5 mLs by mouth every 6 (six) hours as needed for cough.

## 2018-10-02 ENCOUNTER — Encounter: Payer: Self-pay | Admitting: Family

## 2018-10-02 ENCOUNTER — Other Ambulatory Visit: Payer: Medicare Other

## 2018-10-02 ENCOUNTER — Ambulatory Visit (INDEPENDENT_AMBULATORY_CARE_PROVIDER_SITE_OTHER): Payer: Medicare Other | Admitting: Family

## 2018-10-02 VITALS — BP 130/82 | HR 75 | Temp 98.1°F | Ht 61.0 in | Wt 131.0 lb

## 2018-10-02 DIAGNOSIS — R300 Dysuria: Secondary | ICD-10-CM | POA: Diagnosis not present

## 2018-10-02 LAB — POC URINALSYSI DIPSTICK (AUTOMATED)
Bilirubin, UA: NEGATIVE
Glucose, UA: NEGATIVE
KETONES UA: NEGATIVE
NITRITE UA: NEGATIVE
PH UA: 7 (ref 5.0–8.0)
PROTEIN UA: NEGATIVE
RBC UA: NEGATIVE
Spec Grav, UA: <=1.005 — AB (ref 1.010–1.025)
UROBILINOGEN UA: 0.2 U/dL

## 2018-10-02 MED ORDER — CIPROFLOXACIN HCL 250 MG PO TABS: 250.0000 mg | ORAL_TABLET | Freq: Two times a day (BID) | ORAL | 0 refills | Status: DC

## 2018-10-02 NOTE — Progress Notes (Signed)
Mia Newman is a 72 y.o. female with the following history as recorded in EpicCare:  Patient Active Problem List   Diagnosis Date Noted  . Hyperglycemia 08/07/2018  . Chest pain 08/07/2018  . 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 Left rotator cuff tear 06/19/2017  . Shoulder pain 06/04/2017  . Vaginitis and vulvovaginitis 06/09/2014  . Personal history of colonic polyp - adenoma 03/03/2014  . History of diverticulitis 11/21/2013  . Hyperkalemia 09/03/2013  . Meningioma ([place]) 09/02/2013  . 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 Wellness examination 01/04/2012  . Asymptomatic postmenopausal status 03/03/2010  . PURE HYPERCHOLESTEROLEMIA 09/27/2007    Current Outpatient Medications  Medication Sig Dispense Refill  . acetaminophen (TYLENOL) 500 MG tablet Take 500 mg by mouth 2 (two) times daily.      . cefdinir (OMNICEF) 300 MG capsule Take 1 capsule (300 mg total) by mouth 2 (two) times daily. 20 capsule 0  . fluticasone (FLONASE) 50 MCG/ACT nasal spray Place 2 sprays into both nostrils daily. 16 g 6  . guaiFENesin-codeine 100-10 MG/5ML syrup Take 5 mLs by mouth every 6 (six) hours as needed for cough. 118 mL 0  . latanoprost (XALATAN) 0.005 % ophthalmic solution Place 1 drop into both eyes at bedtime.      Marland Kitchen levothyroxine (SYNTHROID, LEVOTHROID) 75 MCG tablet Take 1 tablet (75 mcg total) by mouth daily before breakfast. 90 tablet 3   No current facility-administered medications for this visit.     Allergies: Atorvastatin; Raloxifene; Simvastatin; and Sulfamethoxazole  Past Medical History:  Diagnosis Date  . Allergy   . Arthritis   . ASYMPTOMATIC POSTMENOPAUSAL STATUS 03/03/2010  . DIVERTICULITIS-COLON 05/22/2008  . DIVERTICULOSIS, COLON 05/20/2008  . DM 04/22/2008   borderline  . Glaucoma   . Hyperthyroidism   . Irritable bowel syndrome   . Personal history of colonic polyp - adenoma 03/03/2014  . Pure hypercholesterolemia 09/27/2007  .  Unspecified hypothyroidism 04/22/2008    Past Surgical History:  Procedure Laterality Date  . ABDOMINAL HYSTERECTOMY  1987 apx  . 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)  . BREAST SURGERY    . COLONOSCOPY    . TUBAL LIGATION    . VSD REPAIR  1996    Family History  Problem Relation Age of Onset  . Heart attack Father 71  . Cancer Maternal Grandmother        Uterine Cancer  . Cancer Paternal Grandmother        Stomach Cancer  . Colon cancer Neg Hx     Social History   Tobacco Use  . Smoking status: Never Smoker  . Smokeless tobacco: Never Used  Substance Use Topics  . Alcohol use: No    Subjective:  Woke up Saturday evening with abdominal cramping; was concerned that may be having diverticulitis flare- immediately went on BRAT diet; has had some diarrhea over the past 2 days; states that abdomen has started to feel better but has since noticed last night that she started having increased urinary urgency, frequency, burning with urination; no fever; no blood in urine;     Objective:  Vitals:   10/02/18 1057  BP: 130/82  Pulse: 75  Temp: 98.1 F (36.7 C)  TempSrc: Oral  SpO2: 97%  Weight: 131 lb (59.4 kg)  Height: 5\' 1"  (1.549 m)    General: Well developed, well nourished, in no acute distress  Skin : Warm and dry.  Head: Normocephalic and atraumatic  Eyes:  Sclera and conjunctiva clear; pupils round and reactive to light; extraocular movements intact  Ears: External normal; canals clear; tympanic membranes normal  Oropharynx: Pink, supple. No suspicious lesions  Neck: Supple without thyromegaly, adenopathy  Lungs: Respirations unlabored; clear to auscultation bilaterally without wheeze, rales, rhonchi  CVS exam: normal rate and regular rhythm.  Abdomen: Soft; nontender; nondistended; normoactive bowel sounds; no masses or hepatosplenomegaly  Neurologic: Alert and oriented; speech intact; face symmetrical; moves all extremities well;  CNII-XII intact without focal deficit  Assessment:  1. Dysuria     Plan:  ? Secondary UTI from recent diarrhea; symptoms today do not appear c/w diverticulitis; update U/A and urine culture; will start Cipro 250 mg bid x 3 days; increase fluids and will follow-up based on culture results.   No follow-ups on file.  No orders of the defined types were placed in this encounter.   Requested Prescriptions    No prescriptions requested or ordered in this encounter

## 2018-10-03 LAB — URINE CULTURE
MICRO NUMBER:: 91483640
SPECIMEN QUALITY:: ADEQUATE

## 2019-01-06 DIAGNOSIS — H401232 Low-tension glaucoma, bilateral, moderate stage: Secondary | ICD-10-CM | POA: Diagnosis not present

## 2019-03-05 ENCOUNTER — Encounter: Payer: Self-pay | Admitting: Internal Medicine

## 2019-04-08 DIAGNOSIS — H5340 Unspecified visual field defects: Secondary | ICD-10-CM | POA: Diagnosis not present

## 2019-04-08 DIAGNOSIS — H401232 Low-tension glaucoma, bilateral, moderate stage: Secondary | ICD-10-CM | POA: Diagnosis not present

## 2019-07-10 DIAGNOSIS — H401132 Primary open-angle glaucoma, bilateral, moderate stage: Secondary | ICD-10-CM | POA: Diagnosis not present

## 2019-07-24 ENCOUNTER — Other Ambulatory Visit (INDEPENDENT_AMBULATORY_CARE_PROVIDER_SITE_OTHER): Payer: Medicare Other

## 2019-07-24 ENCOUNTER — Other Ambulatory Visit: Payer: Self-pay

## 2019-07-24 ENCOUNTER — Encounter: Payer: Self-pay | Admitting: Internal Medicine

## 2019-07-24 ENCOUNTER — Ambulatory Visit (INDEPENDENT_AMBULATORY_CARE_PROVIDER_SITE_OTHER): Payer: Medicare Other | Admitting: Internal Medicine

## 2019-07-24 ENCOUNTER — Ambulatory Visit (INDEPENDENT_AMBULATORY_CARE_PROVIDER_SITE_OTHER)
Admission: RE | Admit: 2019-07-24 | Discharge: 2019-07-24 | Disposition: A | Discharge status: Home / Self Care | Payer: Medicare Other | Source: Ambulatory Visit | Attending: Internal Medicine | Admitting: Internal Medicine

## 2019-07-24 VITALS — BP 140/82 | HR 59 | Temp 98.1°F | Ht 61.0 in | Wt 128.0 lb

## 2019-07-24 DIAGNOSIS — K5732 Diverticulitis of large intestine without perforation or abscess without bleeding: Secondary | ICD-10-CM | POA: Insufficient documentation

## 2019-07-24 DIAGNOSIS — E890 Postprocedural hypothyroidism: Secondary | ICD-10-CM | POA: Diagnosis not present

## 2019-07-24 DIAGNOSIS — R10815 Periumbilic abdominal tenderness: Secondary | ICD-10-CM | POA: Insufficient documentation

## 2019-07-24 DIAGNOSIS — R1030 Lower abdominal pain, unspecified: Secondary | ICD-10-CM | POA: Diagnosis not present

## 2019-07-24 LAB — CBC WITH DIFFERENTIAL/PLATELET
Basophils Absolute: 0.1 10*3/uL (ref 0.0–0.1)
Basophils Relative: 0.8 % (ref 0.0–3.0)
Eosinophils Absolute: 0.1 10*3/uL (ref 0.0–0.7)
Eosinophils Relative: 1.5 % (ref 0.0–5.0)
HCT: 43.9 % (ref 36.0–46.0)
Hemoglobin: 14.6 g/dL (ref 12.0–15.0)
Lymphocytes Relative: 16.6 % (ref 12.0–46.0)
Lymphs Abs: 1.6 10*3/uL (ref 0.7–4.0)
MCHC: 33.3 g/dL (ref 30.0–36.0)
MCV: 92.9 fl (ref 78.0–100.0)
Monocytes Absolute: 0.9 10*3/uL (ref 0.1–1.0)
Monocytes Relative: 9.4 % (ref 3.0–12.0)
Neutro Abs: 7.1 10*3/uL (ref 1.4–7.7)
Neutrophils Relative %: 71.7 % (ref 43.0–77.0)
Platelets: 278 10*3/uL (ref 150.0–400.0)
RBC: 4.73 Mil/uL (ref 3.87–5.11)
RDW: 14.1 % (ref 11.5–15.5)
WBC: 9.9 10*3/uL (ref 4.0–10.5)

## 2019-07-24 LAB — URINALYSIS, ROUTINE W REFLEX MICROSCOPIC
Bilirubin Urine: NEGATIVE
Hgb urine dipstick: NEGATIVE
Ketones, ur: NEGATIVE
Nitrite: NEGATIVE
RBC / HPF: NONE SEEN (ref 0–?)
Specific Gravity, Urine: <=1.005 — AB (ref 1.000–1.030)
Total Protein, Urine: NEGATIVE
Urine Glucose: NEGATIVE
Urobilinogen, UA: 0.2 (ref 0.0–1.0)
pH: 7 (ref 5.0–8.0)

## 2019-07-24 LAB — HEPATIC FUNCTION PANEL
ALT: 12 U/L (ref 0–35)
AST: 11 U/L (ref 0–37)
Albumin: 4.1 g/dL (ref 3.5–5.2)
Alkaline Phosphatase: 56 U/L (ref 39–117)
Bilirubin, Direct: 0 mg/dL (ref 0.0–0.3)
Total Bilirubin: 0.4 mg/dL (ref 0.2–1.2)
Total Protein: 7 g/dL (ref 6.0–8.3)

## 2019-07-24 LAB — BASIC METABOLIC PANEL
BUN: 7 mg/dL (ref 6–23)
CO2: 30 mEq/L (ref 19–32)
Calcium: 9.4 mg/dL (ref 8.4–10.5)
Chloride: 101 mEq/L (ref 96–112)
Creatinine, Ser: 0.74 mg/dL (ref 0.40–1.20)
GFR: 77.02 mL/min (ref >60.00)
Glucose, Bld: 98 mg/dL (ref 70–99)
Potassium: 4.3 mEq/L (ref 3.5–5.1)
Sodium: 139 mEq/L (ref 135–145)

## 2019-07-24 LAB — TSH: TSH: 12.02 u[IU]/mL — ABNORMAL HIGH (ref 0.35–4.50)

## 2019-07-24 LAB — SEDIMENTATION RATE: Sed Rate: 31 mm/hr — ABNORMAL HIGH (ref 0–30)

## 2019-07-24 LAB — LIPASE: Lipase: 28 U/L (ref 11.0–59.0)

## 2019-07-24 LAB — AMYLASE: Amylase: 39 U/L (ref 27–131)

## 2019-07-24 MED ORDER — LEVOTHYROXINE SODIUM 75 MCG PO TABS: 75.0000 ug | ORAL_TABLET | Freq: Every day | ORAL | 0 refills | Status: DC

## 2019-07-24 MED ORDER — AMOXICILLIN-POT CLAVULANATE 875-125 MG PO TABS: 1.0000 | ORAL_TABLET | Freq: Two times a day (BID) | ORAL | 0 refills | Status: AC

## 2019-07-24 NOTE — Patient Instructions (Signed)
Abdominal Pain, Adult Abdominal pain can be caused by many things. Often, abdominal pain is not serious and it gets better with no treatment or by being treated at home. However, sometimes abdominal pain is serious. Your health care provider will do a medical history and a physical exam to try to determine the cause of your abdominal pain. Follow these instructions at home:  Take over-the-counter and prescription medicines only as told by your health care provider. Do not take a laxative unless told by your health care provider.  Drink enough fluid to keep your urine clear or pale yellow.  Watch your condition for any changes.  Keep all follow-up visits as told by your health care provider. This is important. Contact a health care provider if:  Your abdominal pain changes or gets worse.  You are not hungry or you lose weight without trying.  You are constipated or have diarrhea for more than 2-3 days.  You have pain when you urinate or have a bowel movement.  Your abdominal pain wakes you up at night.  Your pain gets worse with meals, after eating, or with certain foods.  You are throwing up and cannot keep anything down.  You have a fever. Get help right away if:  Your pain does not go away as soon as your health care provider told you to expect.  You cannot stop throwing up.  Your pain is only in areas of the abdomen, such as the right side or the left lower portion of the abdomen.  You have bloody or black stools, or stools that look like tar.  You have severe pain, cramping, or bloating in your abdomen.  You have signs of dehydration, such as: ? Dark urine, very little urine, or no urine. ? Cracked lips. ? Dry mouth. ? Sunken eyes. ? Sleepiness. ? Weakness. This information is not intended to replace advice given to you by your health care provider. Make sure you discuss any questions you have with your health care provider. Document Released: 07/19/2005 Document  Revised: 04/28/2016 Document Reviewed: 03/22/2016 Elsevier Interactive Patient Education  2020 Elsevier Inc.  

## 2019-07-24 NOTE — Progress Notes (Signed)
Subjective:  Patient ID: Mia Newman, female    DOB: 06/27/1946  Age: 73 y.o. MRN: [id number]  CC: Abdominal Pain  NEW TO ME  HPI Mercedese Acob Newman presents for concerns about a 4-day history of midline, lower abdominal pain between her umbilicus and the pelvic region.  She had pain similar to this about 9 months ago and was treated for diverticulitis.  She describes the pain as an intermittent stabbing sensation with improvement after urination and defecation.  She has had chills but denies fever, nausea, vomiting, loss of appetite, diarrhea, dysuria, hematuria, or bloody stools.  Outpatient Medications Prior to Visit  Medication Sig Dispense Refill  . acetaminophen (TYLENOL) 500 MG tablet Take 500 mg by mouth 2 (two) times daily.      . fluticasone (FLONASE) 50 MCG/ACT nasal spray Place 2 sprays into both nostrils daily. 16 g 6  . latanoprost (XALATAN) 0.005 % ophthalmic solution Place 1 drop into both eyes at bedtime.      Marland Kitchen levothyroxine (SYNTHROID) 50 MCG tablet Take 50 mcg by mouth daily.    . ciprofloxacin (CIPRO) 250 MG tablet Take 1 tablet (250 mg total) by mouth 2 (two) times daily. 6 tablet 0  . levothyroxine (SYNTHROID, LEVOTHROID) 75 MCG tablet Take 1 tablet (75 mcg total) by mouth daily before breakfast. 90 tablet 3   No facility-administered medications prior to visit.     ROS Review of Systems  Constitutional: Positive for chills. Negative for appetite change, fatigue and fever.  Respiratory: Negative for cough, chest tightness, shortness of breath and wheezing.   Cardiovascular: Negative for chest pain, palpitations and leg swelling.  Gastrointestinal: Positive for abdominal pain and constipation. Negative for blood in stool, diarrhea, nausea and vomiting.  Genitourinary: Negative.  Negative for decreased urine volume, difficulty urinating, dysuria, frequency, hematuria, urgency and vaginal bleeding.  Musculoskeletal: Negative.  Negative for back pain and  myalgias.  Skin: Negative.  Negative for color change and pallor.  Neurological: Negative.  Negative for dizziness, weakness and light-headedness.  Hematological: Negative for adenopathy. Does not bruise/bleed easily.  Psychiatric/Behavioral: Negative.     Objective:  BP 140/82 (BP Location: Left Arm, Patient Position: Sitting, Cuff Size: Normal)   Pulse (!) 59   Temp 98.1 F (36.7 C) (Oral)   Ht 5\' 1"  (1.549 m)   Wt 128 lb (58.1 kg)   SpO2 98%   BMI 24.19 kg/m   BP Readings from Last 3 Encounters:  07/24/19 140/82  10/02/18 130/82  09/18/18 128/84    Wt Readings from Last 3 Encounters:  07/24/19 128 lb (58.1 kg)  10/02/18 131 lb (59.4 kg)  09/18/18 131 lb 0.6 oz (59.4 kg)    Physical Exam Vitals signs reviewed.  Constitutional:      General: She is not in acute distress.    Appearance: She is well-developed. She is not ill-appearing, toxic-appearing or diaphoretic.  HENT:     Nose: Nose normal.     Mouth/Throat:     Mouth: Mucous membranes are moist.  Eyes:     General: No scleral icterus.    Conjunctiva/sclera: Conjunctivae normal.  Cardiovascular:     Rate and Rhythm: Normal rate and regular rhythm.     Heart sounds: No murmur.  Pulmonary:     Effort: Pulmonary effort is normal. No respiratory distress.     Breath sounds: No stridor. No wheezing, rhonchi or rales.  Abdominal:     General: Abdomen is flat. Bowel sounds are normal. There is  no distension.     Palpations: Abdomen is soft. There is no hepatomegaly, splenomegaly or mass.     Tenderness: There is abdominal tenderness in the periumbilical area. There is no right CVA tenderness, left CVA tenderness, guarding or rebound.     Hernia: No hernia is present.  Musculoskeletal: Normal range of motion.  Skin:    General: Skin is warm and dry.  Neurological:     General: No focal deficit present.     Mental Status: She is alert.     Lab Results  Component Value Date   WBC 9.9 07/24/2019   HGB 14.6  07/24/2019   HCT 43.9 07/24/2019   PLT 278.0 07/24/2019   GLUCOSE 98 07/24/2019   CHOL 284 (H) 08/07/2018   TRIG 260.0 (H) 08/07/2018   HDL 43.10 08/07/2018   LDLDIRECT 209.0 08/07/2018   LDLCALC 173 (H) 08/26/2014   ALT 12 07/24/2019   AST 11 07/24/2019   NA 139 07/24/2019   K 4.3 07/24/2019   CL 101 07/24/2019   CREATININE 0.74 07/24/2019   BUN 7 07/24/2019   CO2 30 07/24/2019   TSH 12.02 (H) 07/24/2019   HGBA1C 5.4 08/07/2018   MICROALBUR <0.7 06/04/2017    Mm 3d Screen Breast Bilateral  Result Date: 09/17/2018 CLINICAL DATA:  Screening. EXAM: DIGITAL SCREENING BILATERAL MAMMOGRAM WITH TOMO AND CAD COMPARISON:  Previous exam(s). ACR Breast Density Category c: The breast tissue is heterogeneously dense, which may obscure small masses. FINDINGS: There are no findings suspicious for malignancy. Images were processed with CAD. IMPRESSION: No mammographic evidence of malignancy. A result letter of this screening mammogram will be mailed directly to the patient. RECOMMENDATION: Screening mammogram in one year. (Code:SM-B-01Y) BI-RADS CATEGORY  1: Negative. Electronically Signed   By: Lajean Manes M.D.   On: 09/17/2018 14:00    Dg Abd Acute 2+v W 1v Chest  Result Date: 07/24/2019 CLINICAL DATA:  Lower abdominal pain EXAM: DG ABDOMEN ACUTE W/ 1V CHEST COMPARISON:  None. FINDINGS: Moderate stool burden throughout the colon. The bowel gas pattern is normal. There is no evidence of free intraperitoneal air. No suspicious radio-opaque calculi or other significant radiographic abnormality is seen. Heart size and mediastinal contours are within normal limits. Both lungs are clear. IMPRESSION: Moderate stool burden.  No acute findings. Electronically Signed   By: Rolm Baptise M.D.   On: 07/24/2019 16:07    Assessment & Plan:   Danesia was seen today for abdominal pain.  Diagnoses and all orders for this visit:  Hypothyroidism following radioiodine therapy- Her TSH is elevated at 12 and  she complains of constipation.  I recommended that she increase her levothyroxine dose to 75 mcg a day. -     TSH; Future -     levothyroxine (SYNTHROID) 75 MCG tablet; Take 1 tablet (75 mcg total) by mouth daily.  Periumbilical abdominal tenderness without rebound tenderness-she has abdominal pain and tenderness.  Examination is reassuring that she does not have an acute abdominal process.  Plain films only show stool retention.  She has a very mildly elevated white cell count and sed rate but the rest of her labs are reassuring.  There are a few white cells in her urine but no red blood cells.  I will treat her for diverticulitis with a 7-day course of Augmentin. -     CBC with Differential/Platelet; Future -     Basic metabolic panel; Future -     Urinalysis, Routine w reflex microscopic; Future -  Hepatic function panel; Future -     Sedimentation rate; Future -     Lipase; Future -     Amylase; Future -     DG ABD ACUTE 2+V W 1V CHEST; Future  Diverticulitis of large intestine without perforation or abscess without bleeding -     amoxicillin-clavulanate (AUGMENTIN) 875-125 MG tablet; Take 1 tablet by mouth 2 (two) times daily for 7 days.   I have discontinued Mardene Celeste A. Doss's levothyroxine, ciprofloxacin, and levothyroxine. I am also having her start on levothyroxine and amoxicillin-clavulanate. Additionally, I am having her maintain her acetaminophen, latanoprost, and fluticasone.  Meds ordered this encounter  Medications  . levothyroxine (SYNTHROID) 75 MCG tablet    Sig: Take 1 tablet (75 mcg total) by mouth daily.    Dispense:  90 tablet    Refill:  0  . amoxicillin-clavulanate (AUGMENTIN) 875-125 MG tablet    Sig: Take 1 tablet by mouth 2 (two) times daily for 7 days.    Dispense:  14 tablet    Refill:  0     Follow-up: Return in 3 weeks (on 08/14/2019).  Scarlette Calico, MD

## 2019-10-19 ENCOUNTER — Other Ambulatory Visit: Payer: Self-pay | Admitting: Internal Medicine

## 2019-10-19 DIAGNOSIS — E890 Postprocedural hypothyroidism: Secondary | ICD-10-CM

## 2019-10-23 ENCOUNTER — Telehealth: Payer: Self-pay | Admitting: Family

## 2019-10-23 ENCOUNTER — Other Ambulatory Visit: Payer: Self-pay

## 2019-10-23 NOTE — Telephone Encounter (Signed)
Copied from [place] [id number]. Topic: General - Other >> Oct 23, 2019 12:12 PM Keene Breath wrote: Reason for CRM: Pharmacy called to inform the doctor that the script for levothyroxine (SYNTHROID) 75 MCG tablet, is out of stock and they would like to use an alternative.  Please advise and call to confirm at [phone]

## 2019-10-23 NOTE — Telephone Encounter (Signed)
Pt called in and would like to see if this script could be transferred to the [facility] on [address]?

## 2019-10-23 NOTE — Telephone Encounter (Signed)
Yes it can  This an be requested by the pt at the pharmacy  We do not have to be involved

## 2019-10-27 NOTE — Telephone Encounter (Signed)
Called [facility]--stated already tranfer the Rx levothyroxine 75 mcg to [facility]

## 2019-11-28 ENCOUNTER — Ambulatory Visit (HOSPITAL_COMMUNITY)
Admission: EM | Admit: 2019-11-28 | Discharge: 2019-11-28 | Disposition: A | Discharge status: Home / Self Care | Payer: Medicare Other | Attending: Family Medicine | Admitting: Family Medicine

## 2019-11-28 ENCOUNTER — Other Ambulatory Visit: Payer: Self-pay

## 2019-11-28 ENCOUNTER — Encounter (HOSPITAL_COMMUNITY): Payer: Self-pay

## 2019-11-28 ENCOUNTER — Telehealth: Payer: Self-pay | Admitting: Internal Medicine

## 2019-11-28 DIAGNOSIS — Z8719 Personal history of other diseases of the digestive system: Secondary | ICD-10-CM

## 2019-11-28 DIAGNOSIS — R1032 Left lower quadrant pain: Secondary | ICD-10-CM | POA: Insufficient documentation

## 2019-11-28 LAB — CBC WITH DIFFERENTIAL/PLATELET
Abs Immature Granulocytes: 0.03 10*3/uL (ref 0.00–0.07)
Basophils Absolute: 0 10*3/uL (ref 0.0–0.1)
Basophils Relative: 0 %
Eosinophils Absolute: 0.1 10*3/uL (ref 0.0–0.5)
Eosinophils Relative: 1 %
HCT: 44.3 % (ref 36.0–46.0)
Hemoglobin: 14.7 g/dL (ref 12.0–15.0)
Immature Granulocytes: 0 %
Lymphocytes Relative: 16 %
Lymphs Abs: 1.7 10*3/uL (ref 0.7–4.0)
MCH: 29.9 pg (ref 26.0–34.0)
MCHC: 33.2 g/dL (ref 30.0–36.0)
MCV: 90.2 fL (ref 80.0–100.0)
Monocytes Absolute: 1 10*3/uL (ref 0.1–1.0)
Monocytes Relative: 10 %
Neutro Abs: 7.8 10*3/uL — ABNORMAL HIGH (ref 1.7–7.7)
Neutrophils Relative %: 73 %
Platelets: 304 10*3/uL (ref 150–400)
RBC: 4.91 MIL/uL (ref 3.87–5.11)
RDW: 13.2 % (ref 11.5–15.5)
WBC: 10.7 10*3/uL — ABNORMAL HIGH (ref 4.0–10.5)
nRBC: 0 % (ref 0.0–0.2)

## 2019-11-28 LAB — POCT URINALYSIS DIP (DEVICE)
Bilirubin Urine: NEGATIVE
Glucose, UA: NEGATIVE mg/dL
Hgb urine dipstick: NEGATIVE
Ketones, ur: NEGATIVE mg/dL
Nitrite: NEGATIVE
Protein, ur: NEGATIVE mg/dL
Specific Gravity, Urine: 1.01 (ref 1.005–1.030)
Urobilinogen, UA: 0.2 mg/dL (ref 0.0–1.0)
pH: 7.5 (ref 5.0–8.0)

## 2019-11-28 MED ORDER — AMOXICILLIN-POT CLAVULANATE 875-125 MG PO TABS: 1.0000 | ORAL_TABLET | Freq: Two times a day (BID) | ORAL | 0 refills | Status: AC

## 2019-11-28 NOTE — ED Triage Notes (Signed)
Patient presents to Urgent Care with complaints of LLQ abdominal pain since 5 days ago. Patient reports she thinks she is having a diverticulitis flare up, has been taking tylenol.

## 2019-11-28 NOTE — Telephone Encounter (Signed)
Spoke to the patient who has not been seen in the office since 02/2016. She was furious when she was asked to make an appointment to see Dr. Carlean Purl since it has been so long and he would need an updated health history/exam. She stated she saw her PCP 6 months ago which should be adequate. I told the patient she needed to actually be seen by her gastroenterologist since she was requesting medication/advice from her gastroenterologist. She then went on to demand to be scheduled with Dr. Carlean Purl today. The patient was audibly yelling on the phone. She was told that could not be accomplished but she could see a PA. She then stated "Fine! You tell me what time and I'll tell you if I can be there." I told the patient she could see Amy Esterwood on Monday at 2:00 pm and the patient loudly stated "Nope! I'll just go to urgent care!" and abruptly hung up the phone.

## 2019-11-28 NOTE — ED Provider Notes (Signed)
[place]    CSN: [id number] Arrival date & time: 11/28/19  1344      History   Chief Complaint Chief Complaint  Patient presents with  . Abdominal Pain    HPI Mia Newman is a 74 y.o. female.   Mia Newman presents with complaints of abdominal pain. 2/2 woke up with pain under her belly button. Now pain is radiating to LLQ. History of diverticulitis, intermittent for the past 8 years. History of cystitis as well. Last episode of diverticulitis approximately 6 months ago. Saw her PCP with [facility] at that time and was treated with antibiotics which helped. Pain feels similar to previous diverticulitis. Has been on the "Brats" diet for the past few days. Still with pain. Normal bowel movements. Had a normal bowel movement today. Pain is constant. Movement worsens the pain. Not worse with eating. No nausea or vomiting. No fevers. No pain with urination. Some urinary frequency, this is not necessarily new for her. Colonoscopy was a few years ago with diverticulosis and polyps.  Hysterectomy as only abdominal surgery. Vaginal prolapse with pessary management.   ROS per HPI, negative if not otherwise mentioned.      Past Medical History:  Diagnosis Date  . Allergy   . Arthritis   . ASYMPTOMATIC POSTMENOPAUSAL STATUS 03/03/2010  . DIVERTICULITIS-COLON 05/22/2008  . DIVERTICULOSIS, COLON 05/20/2008  . DM 04/22/2008   borderline  . Glaucoma   . Hyperthyroidism   . Irritable bowel syndrome   . Personal history of colonic polyp - adenoma 03/03/2014  . Pure hypercholesterolemia 09/27/2007  . Unspecified hypothyroidism 04/22/2008    Patient Active Problem List   Diagnosis Date Noted  . Periumbilical abdominal tenderness without rebound tenderness 07/24/2019  . Diverticulitis of large intestine without perforation or abscess without bleeding 07/24/2019  . Hyperglycemia 08/07/2018  . 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 Left rotator cuff tear 06/19/2017    . Vaginitis and vulvovaginitis 06/09/2014  . Meningioma ([place]) 09/02/2013  . 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 Wellness examination 01/04/2012  . Asymptomatic postmenopausal status 03/03/2010  . PURE HYPERCHOLESTEROLEMIA 09/27/2007    Past Surgical History:  Procedure Laterality Date  . ABDOMINAL HYSTERECTOMY  1987 apx  . 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 BREAST SURGERY    . COLONOSCOPY    . TUBAL LIGATION    . VSD REPAIR  1996    OB History   No obstetric history on file.      Home Medications    Prior to Admission medications   Medication Sig Start Date End Date Taking? Authorizing Provider  acetaminophen (TYLENOL) 500 MG tablet Take 500 mg by mouth 2 (two) times daily.     Yes [provider]  amoxicillin-clavulanate (AUGMENTIN) 875-125 MG tablet Take 1 tablet by mouth every 12 (twelve) hours for 7 days. 11/28/19 12/05/19  Zigmund Gottron, NP  fluticasone (FLONASE) 50 MCG/ACT nasal spray Place 2 sprays into both nostrils daily. 09/18/18   Marrian Salvage, FNP  latanoprost (XALATAN) 0.005 % ophthalmic solution Place 1 drop into both eyes at bedtime.      [provider]  levothyroxine (SYNTHROID) 75 MCG tablet Take 1 tablet by mouth once daily 10/22/19   Janith Lima, MD    Family History Family History  Problem Relation Age of Onset  . Heart attack Father 47  . Hypertension Mother   . Cancer Maternal Grandmother  Uterine Cancer  . Cancer Paternal Grandmother        Stomach Cancer  . Colon cancer Neg Hx     Social History Social History   Tobacco Use  . Smoking status: Never Smoker  . Smokeless tobacco: Never Used  Substance Use Topics  . Alcohol use: No  . Drug use: No     Allergies   Atorvastatin, Raloxifene, Simvastatin, and Sulfamethoxazole   Review of Systems Review of Systems   Physical Exam Triage Vital Signs ED Triage Vitals  Enc Vitals Group      BP 11/28/19 1456 (!) 151/77     Pulse Rate 11/28/19 1456 75     Resp 11/28/19 1456 16     Temp 11/28/19 1456 98.2 F (36.8 C)     Temp Source 11/28/19 1456 Oral     SpO2 11/28/19 1456 100 %     Weight --      Height --      Head Circumference --      Peak Flow --      Pain Score 11/28/19 1454 7     Pain Loc --      Pain Edu? --      Excl. in [place]? --    No data found.  Updated Vital Signs BP (!) 151/77 (BP Location: Left Arm)   Pulse 75   Temp 98.2 F (36.8 C) (Oral)   Resp 16   SpO2 100%    Physical Exam Constitutional:      General: She is not in acute distress.    Appearance: She is well-developed.  Cardiovascular:     Rate and Rhythm: Normal rate.  Pulmonary:     Effort: Pulmonary effort is normal.  Abdominal:     Tenderness: There is abdominal tenderness in the suprapubic area and left lower quadrant. There is no right CVA tenderness, left CVA tenderness, guarding or rebound.  Skin:    General: Skin is warm and dry.  Neurological:     Mental Status: She is alert and oriented to person, place, and time.      UC Treatments / Results  Labs (all labs ordered are listed, but only abnormal results are displayed) Labs Reviewed  CBC WITH DIFFERENTIAL/PLATELET - Abnormal; Notable for the following components:      Result Value   WBC 10.7 (*)    Neutro Abs 7.8 (*)    All other components within normal limits  POCT URINALYSIS DIP (DEVICE) - Abnormal; Notable for the following components:   Leukocytes,Ua SMALL (*)    All other components within normal limits  URINE CULTURE    EKG   Radiology No results found.  Procedures Procedures (including critical care time)  Medications Ordered in UC Medications - No data to display  Initial Impression / Assessment and Plan / UC Course  I have reviewed the triage vital signs and the nursing notes.  Pertinent labs & imaging results that were available during my care of the patient were reviewed by me and  considered in my medical decision making (see chart for details).     Afebrile. No rebound or guarding. Urine with small leuks with culture obtained and pending. Cbc obtained for baseline, on review after visit once back there is slight elevation in WBC at 10.7 and neutrophils. Treatment for diverticulitis initiated at this time with strict er precautions provided. Patient verbalized understanding and agreeable to plan.  Ambulatory out of clinic without difficulty.    Final Clinical Impressions(s) /  UC Diagnoses   Final diagnoses:  Left lower quadrant abdominal pain  History of diverticulitis     Discharge Instructions     We will start antibiotics which treat diverticulitis.  Your urine culture is pending as well and we would call you if different antibiotics are indicated from this.  Continue to follow with your primary care provider and/or your gastroenterologist.  If worsening of symptoms, pain, fevers, or other gi symptoms, please go to the ER.     ED Prescriptions    Medication Sig Dispense Auth. Provider   amoxicillin-clavulanate (AUGMENTIN) 875-125 MG tablet Take 1 tablet by mouth every 12 (twelve) hours for 7 days. 14 tablet Zigmund Gottron, NP     PDMP not reviewed this encounter.   Zigmund Gottron, NP 11/28/19 1825

## 2019-11-28 NOTE — Discharge Instructions (Signed)
We will start antibiotics which treat diverticulitis.  Your urine culture is pending as well and we would call you if different antibiotics are indicated from this.  Continue to follow with your primary care provider and/or your gastroenterologist.  If worsening of symptoms, pain, fevers, or other gi symptoms, please go to the ER.

## 2019-11-30 LAB — URINE CULTURE: Culture: 60000 — AB

## 2019-12-01 ENCOUNTER — Telehealth (HOSPITAL_COMMUNITY): Payer: Self-pay | Admitting: Emergency Medicine

## 2019-12-01 NOTE — Telephone Encounter (Signed)
Attempted to reach patient to discuss labs and provider instruction. No answer at this time. Voicemail left.

## 2019-12-01 NOTE — Telephone Encounter (Signed)
Contacted pt regarding labs and provider instruction. Pt states she is feeling much better on the antibiotic. All questions answered.

## 2019-12-10 ENCOUNTER — Other Ambulatory Visit: Payer: Self-pay

## 2019-12-10 ENCOUNTER — Encounter: Payer: Self-pay | Admitting: Family

## 2019-12-10 ENCOUNTER — Ambulatory Visit (INDEPENDENT_AMBULATORY_CARE_PROVIDER_SITE_OTHER): Payer: Medicare Other | Admitting: Family

## 2019-12-10 VITALS — BP 128/76 | HR 71 | Temp 97.9°F | Ht 61.0 in | Wt 128.4 lb

## 2019-12-10 DIAGNOSIS — K5792 Diverticulitis of intestine, part unspecified, without perforation or abscess without bleeding: Secondary | ICD-10-CM | POA: Diagnosis not present

## 2019-12-10 DIAGNOSIS — E890 Postprocedural hypothyroidism: Secondary | ICD-10-CM

## 2019-12-10 DIAGNOSIS — R300 Dysuria: Secondary | ICD-10-CM

## 2019-12-10 DIAGNOSIS — R1032 Left lower quadrant pain: Secondary | ICD-10-CM | POA: Diagnosis not present

## 2019-12-10 LAB — URINALYSIS, ROUTINE W REFLEX MICROSCOPIC
Bilirubin Urine: NEGATIVE
Hgb urine dipstick: NEGATIVE
Ketones, ur: NEGATIVE
Nitrite: NEGATIVE
RBC / HPF: NONE SEEN (ref 0–?)
Specific Gravity, Urine: <=1.005 — AB (ref 1.000–1.030)
Total Protein, Urine: NEGATIVE
Urine Glucose: NEGATIVE
Urobilinogen, UA: 0.2 (ref 0.0–1.0)
pH: 7 (ref 5.0–8.0)

## 2019-12-10 LAB — CBC WITH DIFFERENTIAL/PLATELET
Basophils Absolute: 0 10*3/uL (ref 0.0–0.1)
Basophils Relative: 0.5 % (ref 0.0–3.0)
Eosinophils Absolute: 0.2 10*3/uL (ref 0.0–0.7)
Eosinophils Relative: 2.4 % (ref 0.0–5.0)
HCT: 44.2 % (ref 36.0–46.0)
Hemoglobin: 14.4 g/dL (ref 12.0–15.0)
Lymphocytes Relative: 24.8 % (ref 12.0–46.0)
Lymphs Abs: 2 10*3/uL (ref 0.7–4.0)
MCHC: 32.6 g/dL (ref 30.0–36.0)
MCV: 91.1 fl (ref 78.0–100.0)
Monocytes Absolute: 0.6 10*3/uL (ref 0.1–1.0)
Monocytes Relative: 7.1 % (ref 3.0–12.0)
Neutro Abs: 5.2 10*3/uL (ref 1.4–7.7)
Neutrophils Relative %: 65.2 % (ref 43.0–77.0)
Platelets: 331 10*3/uL (ref 150.0–400.0)
RBC: 4.85 Mil/uL (ref 3.87–5.11)
RDW: 13.9 % (ref 11.5–15.5)
WBC: 8 10*3/uL (ref 4.0–10.5)

## 2019-12-10 LAB — COMPREHENSIVE METABOLIC PANEL
ALT: 13 U/L (ref 0–35)
AST: 13 U/L (ref 0–37)
Albumin: 4.1 g/dL (ref 3.5–5.2)
Alkaline Phosphatase: 55 U/L (ref 39–117)
BUN: 7 mg/dL (ref 6–23)
CO2: 30 mEq/L (ref 19–32)
Calcium: 9.3 mg/dL (ref 8.4–10.5)
Chloride: 104 mEq/L (ref 96–112)
Creatinine, Ser: 0.63 mg/dL (ref 0.40–1.20)
GFR: 92.64 mL/min (ref >60.00)
Glucose, Bld: 87 mg/dL (ref 70–99)
Potassium: 3.9 mEq/L (ref 3.5–5.1)
Sodium: 140 mEq/L (ref 135–145)
Total Bilirubin: 0.3 mg/dL (ref 0.2–1.2)
Total Protein: 6.6 g/dL (ref 6.0–8.3)

## 2019-12-10 LAB — TSH: TSH: 0.6 u[IU]/mL (ref 0.35–4.50)

## 2019-12-10 NOTE — Progress Notes (Signed)
Mia Newman is a 74 y.o. female with the following history as recorded in EpicCare:  Patient Active Problem List   Diagnosis Date Noted  . Periumbilical abdominal tenderness without rebound tenderness 07/24/2019  . Diverticulitis of large intestine without perforation or abscess without bleeding 07/24/2019  . Hyperglycemia 08/07/2018  . 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 Left rotator cuff tear 06/19/2017  . Vaginitis and vulvovaginitis 06/09/2014  . Meningioma ([place]) 09/02/2013  . 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 Wellness examination 01/04/2012  . Asymptomatic postmenopausal status 03/03/2010  . PURE HYPERCHOLESTEROLEMIA 09/27/2007    Current Outpatient Medications  Medication Sig Dispense Refill  . acetaminophen (TYLENOL) 500 MG tablet Take 500 mg by mouth 2 (two) times daily.      . fluticasone (FLONASE) 50 MCG/ACT nasal spray Place 2 sprays into both nostrils daily. 16 g 6  . latanoprost (XALATAN) 0.005 % ophthalmic solution Place 1 drop into both eyes at bedtime.      Marland Kitchen levothyroxine (SYNTHROID) 75 MCG tablet Take 1 tablet by mouth once daily 90 tablet 0   No current facility-administered medications for this visit.    Allergies: Atorvastatin, Raloxifene, Simvastatin, and Sulfamethoxazole  Past Medical History:  Diagnosis Date  . Allergy   . Arthritis   . ASYMPTOMATIC POSTMENOPAUSAL STATUS 03/03/2010  . DIVERTICULITIS-COLON 05/22/2008  . DIVERTICULOSIS, COLON 05/20/2008  . DM 04/22/2008   borderline  . Glaucoma   . Hyperthyroidism   . Irritable bowel syndrome   . Personal history of colonic polyp - adenoma 03/03/2014  . Pure hypercholesterolemia 09/27/2007  . Unspecified hypothyroidism 04/22/2008    Past Surgical History:  Procedure Laterality Date  . ABDOMINAL HYSTERECTOMY  1987 apx  . 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 BREAST SURGERY    . COLONOSCOPY    . TUBAL LIGATION    . VSD REPAIR  1996     Family History  Problem Relation Age of Onset  . Heart attack Father 69  . Hypertension Mother   . Cancer Maternal Grandmother        Uterine Cancer  . Cancer Paternal Grandmother        Stomach Cancer  . Colon cancer Neg Hx     Social History   Tobacco Use  . Smoking status: Never Smoker  . Smokeless tobacco: Never Used  Substance Use Topics  . Alcohol use: No    Subjective:  Patient had 3rd episode of diverticulitis in the past 6 months- 1 year; is feeling better but has not had CT scan with any of the episodes; Also felt that she had UTI prior to onset of diverticulitis; would like to get U/A checked today; Due for repeat TSH today; currently taking 75 mcg daily;     Objective:  Vitals:   12/10/19 1050  BP: 128/76  Pulse: 71  Temp: 97.9 F (36.6 C)  TempSrc: Oral  SpO2: 97%  Weight: 128 lb 6.4 oz (58.2 kg)  Height: '5\' 1"'$  (1.549 m)    General: Well developed, well nourished, in no acute distress  Skin : Warm and dry.  Head: Normocephalic and atraumatic  Lungs: Respirations unlabored; clear to auscultation bilaterally without wheeze, rales, rhonchi  CVS exam: normal rate and regular rhythm.  Neurologic: Alert and oriented; speech intact; face symmetrical; moves all extremities well; CNII-XII intact without focal deficit   Assessment:  1. Hypothyroidism following radioiodine therapy   2. Diverticulitis   3. Dysuria   4.  Left lower quadrant abdominal pain     Plan:  1. Check TSH today; 2. Update CT due to frequent episodes; may need to refer to GI; 3. Check U/A and urine culture;   This visit occurred during the SARS-CoV-2 public health emergency.  Safety protocols were in place, including screening questions prior to the visit, additional usage of staff PPE, and extensive cleaning of exam room while observing appropriate contact time as indicated for disinfecting solutions.      No follow-ups on file.  Orders Placed This Encounter  Procedures  .  Urine Culture  . CT Abdomen Pelvis W Contrast    Standing Status:   Future    Standing Expiration Date:   03/08/2021    Order Specific Question:   If indicated for the ordered procedure, I authorize the administration of contrast media per Radiology protocol    Answer:   Yes    Order Specific Question:   Preferred imaging location?    Answer:   [facility]    Order Specific Question:   Is Oral Contrast requested for this exam?    Answer:   Yes, Per Radiology protocol    Order Specific Question:   Radiology Contrast Protocol - do NOT remove file path    Answer:   [email or web address]  . TSH  . CBC w/Diff  . Comp Met (CMET)  . TSH  . Urinalysis    Requested Prescriptions    No prescriptions requested or ordered in this encounter

## 2019-12-11 LAB — URINE CULTURE

## 2019-12-17 ENCOUNTER — Ambulatory Visit
Admission: RE | Admit: 2019-12-17 | Discharge: 2019-12-17 | Disposition: A | Discharge status: Home / Self Care | Payer: Medicare Other | Source: Ambulatory Visit | Attending: Family | Admitting: Family

## 2019-12-17 DIAGNOSIS — K5730 Diverticulosis of large intestine without perforation or abscess without bleeding: Secondary | ICD-10-CM | POA: Diagnosis not present

## 2019-12-17 DIAGNOSIS — R1032 Left lower quadrant pain: Secondary | ICD-10-CM

## 2019-12-17 MED ORDER — IOPAMIDOL (ISOVUE-300) INJECTION 61%
100.0000 mL | Freq: Once | INTRAVENOUS | Status: AC | PRN
  Administered 2019-12-17: 100 mL via INTRAVENOUS

## 2019-12-18 ENCOUNTER — Telehealth: Payer: Self-pay

## 2019-12-18 NOTE — Telephone Encounter (Signed)
New message    The patient was seen at [facility] yesterday for a CT scan.   C/o @ 5:00 pm starting having abdominal pain last all night below the navel.   Checking on test results.

## 2019-12-18 NOTE — Telephone Encounter (Signed)
Called and spoke with patient today. She has been informed that Mickel Baas is out of the office today and she will be contacted tomorrow with results.

## 2019-12-19 NOTE — Telephone Encounter (Signed)
New message    The patient calling asking for test results.   The patient is aware of her message on 12/18/19.

## 2019-12-20 ENCOUNTER — Encounter (HOSPITAL_COMMUNITY): Payer: Self-pay | Admitting: *Deleted

## 2019-12-20 ENCOUNTER — Emergency Department (HOSPITAL_COMMUNITY): Payer: Medicare Other

## 2019-12-20 ENCOUNTER — Emergency Department (HOSPITAL_COMMUNITY)
Admission: EM | Admit: 2019-12-20 | Discharge: 2019-12-20 | Disposition: A | Discharge status: Home / Self Care | Payer: Medicare Other | Attending: Emergency Medicine | Admitting: Emergency Medicine

## 2019-12-20 ENCOUNTER — Other Ambulatory Visit: Payer: Self-pay

## 2019-12-20 DIAGNOSIS — E119 Type 2 diabetes mellitus without complications: Secondary | ICD-10-CM | POA: Diagnosis not present

## 2019-12-20 DIAGNOSIS — Z79899 Other long term (current) drug therapy: Secondary | ICD-10-CM | POA: Insufficient documentation

## 2019-12-20 DIAGNOSIS — R109 Unspecified abdominal pain: Secondary | ICD-10-CM | POA: Diagnosis not present

## 2019-12-20 DIAGNOSIS — N390 Urinary tract infection, site not specified: Secondary | ICD-10-CM | POA: Insufficient documentation

## 2019-12-20 DIAGNOSIS — E890 Postprocedural hypothyroidism: Secondary | ICD-10-CM | POA: Insufficient documentation

## 2019-12-20 DIAGNOSIS — R1030 Lower abdominal pain, unspecified: Secondary | ICD-10-CM

## 2019-12-20 DIAGNOSIS — R1032 Left lower quadrant pain: Secondary | ICD-10-CM | POA: Diagnosis not present

## 2019-12-20 LAB — COMPREHENSIVE METABOLIC PANEL
ALT: 19 U/L (ref 0–44)
AST: 18 U/L (ref 15–41)
Albumin: 3.6 g/dL (ref 3.5–5.0)
Alkaline Phosphatase: 68 U/L (ref 38–126)
Anion gap: 10 (ref 5–15)
BUN: 8 mg/dL (ref 8–23)
CO2: 25 mmol/L (ref 22–32)
Calcium: 8.9 mg/dL (ref 8.9–10.3)
Chloride: 99 mmol/L (ref 98–111)
Creatinine, Ser: 0.66 mg/dL (ref 0.44–1.00)
GFR calc Af Amer: >60 mL/min (ref >60)
GFR calc non Af Amer: >60 mL/min (ref >60)
Glucose, Bld: 140 mg/dL — ABNORMAL HIGH (ref 70–99)
Potassium: 3.7 mmol/L (ref 3.5–5.1)
Sodium: 134 mmol/L — ABNORMAL LOW (ref 135–145)
Total Bilirubin: 1.3 mg/dL — ABNORMAL HIGH (ref 0.3–1.2)
Total Protein: 7.4 g/dL (ref 6.5–8.1)

## 2019-12-20 LAB — URINALYSIS, ROUTINE W REFLEX MICROSCOPIC
Bilirubin Urine: NEGATIVE
Glucose, UA: NEGATIVE mg/dL
Hgb urine dipstick: NEGATIVE
Ketones, ur: 20 mg/dL — AB
Nitrite: POSITIVE — AB
Protein, ur: 100 mg/dL — AB
Specific Gravity, Urine: 1.029 (ref 1.005–1.030)
pH: 5 (ref 5.0–8.0)

## 2019-12-20 LAB — CBC WITH DIFFERENTIAL/PLATELET
Abs Immature Granulocytes: 0.05 10*3/uL (ref 0.00–0.07)
Basophils Absolute: 0 10*3/uL (ref 0.0–0.1)
Basophils Relative: 0 %
Eosinophils Absolute: 0 10*3/uL (ref 0.0–0.5)
Eosinophils Relative: 0 %
HCT: 48 % — ABNORMAL HIGH (ref 36.0–46.0)
Hemoglobin: 15.4 g/dL — ABNORMAL HIGH (ref 12.0–15.0)
Immature Granulocytes: 1 %
Lymphocytes Relative: 8 %
Lymphs Abs: 0.6 10*3/uL — ABNORMAL LOW (ref 0.7–4.0)
MCH: 29.3 pg (ref 26.0–34.0)
MCHC: 32.1 g/dL (ref 30.0–36.0)
MCV: 91.4 fL (ref 80.0–100.0)
Monocytes Absolute: 0.3 10*3/uL (ref 0.1–1.0)
Monocytes Relative: 4 %
Neutro Abs: 7.4 10*3/uL (ref 1.7–7.7)
Neutrophils Relative %: 87 %
Platelets: 314 10*3/uL (ref 150–400)
RBC: 5.25 MIL/uL — ABNORMAL HIGH (ref 3.87–5.11)
RDW: 13.9 % (ref 11.5–15.5)
WBC: 8.4 10*3/uL (ref 4.0–10.5)
nRBC: 0 % (ref 0.0–0.2)

## 2019-12-20 LAB — LIPASE, BLOOD: Lipase: 19 U/L (ref 11–51)

## 2019-12-20 MED ORDER — PHENAZOPYRIDINE HCL 200 MG PO TABS: 200.0000 mg | ORAL_TABLET | Freq: Three times a day (TID) | ORAL | 0 refills | Status: DC | PRN

## 2019-12-20 MED ORDER — CEPHALEXIN 500 MG PO CAPS: 500.0000 mg | ORAL_CAPSULE | Freq: Two times a day (BID) | ORAL | 0 refills | Status: DC

## 2019-12-20 MED ORDER — KETOROLAC TROMETHAMINE 15 MG/ML IJ SOLN
15.0000 mg | Freq: Once | INTRAMUSCULAR | Status: AC
  Administered 2019-12-20: 12:00:00 15 mg via INTRAVENOUS
  Filled 2019-12-20: qty 1

## 2019-12-20 MED ORDER — ALUM & MAG HYDROXIDE-SIMETH 200-200-20 MG/5ML PO SUSP
30.0000 mL | Freq: Once | ORAL | Status: AC
  Administered 2019-12-20: 30 mL via ORAL
  Filled 2019-12-20: qty 30

## 2019-12-20 MED ORDER — PHENAZOPYRIDINE HCL 200 MG PO TABS
200.0000 mg | ORAL_TABLET | Freq: Once | ORAL | Status: AC
  Administered 2019-12-20: 12:00:00 200 mg via ORAL
  Filled 2019-12-20: qty 1

## 2019-12-20 MED ORDER — SODIUM CHLORIDE 0.9% FLUSH
3.0000 mL | Freq: Once | INTRAVENOUS | Status: AC
  Administered 2019-12-20: 3 mL via INTRAVENOUS

## 2019-12-20 MED ORDER — SODIUM CHLORIDE 0.9 % IV SOLN
1.0000 g | Freq: Once | INTRAVENOUS | Status: AC
  Administered 2019-12-20: 12:00:00 1 g via INTRAVENOUS
  Filled 2019-12-20: qty 10

## 2019-12-20 NOTE — ED Notes (Signed)
Patient aware urine needs to be collected

## 2019-12-20 NOTE — ED Triage Notes (Signed)
Pt states she had an abd CT on Thursday and was feeling great until a few hours after CT. Has been having several abd pain since, called triage nurse this morning, it was suggested she come to the ED. No N/V slight diarrhea

## 2019-12-20 NOTE — ED Provider Notes (Signed)
[facility] Provider Note   CSN: [id number] Arrival date & time: 12/20/19  0920     History Chief Complaint  Patient presents with  . Abdominal Pain    Mia Newman is a 74 y.o. female.  74yo F w/ PMH including diverticulosis, hypothyroidism, HLD, IBS, T2DM who p/w abd pain. PT was evaluated for left lower quadrant abdominal pain on 2/5.  She was suspected to have diverticulitis and was given antibiotics.  She was doing okay at PCP visit on 2/17 but then began having abdominal pain again and PCP ordered a CT scan on 2/24.  She has not gotten the results yet.  She reports that she was feeling well when she had the CT scan but later that day began having lower abdominal pain again.  Since then, her abdominal pain has been constant.  She reports that it is worse with movement, especially when she tries to get up from a seated position.  She has been taking Tylenol every 6 hours which initially seemed to help but has not been helping recently.  She had some loose, nonbloody stools this morning but no nausea or vomiting.  No urinary symptoms or fevers.  She called her doctor about the abdominal pain today and was referred to the ED.  The history is provided by the patient.  Abdominal Pain      Past Medical History:  Diagnosis Date  . Allergy   . Arthritis   . ASYMPTOMATIC POSTMENOPAUSAL STATUS 03/03/2010  . DIVERTICULITIS-COLON 05/22/2008  . DIVERTICULOSIS, COLON 05/20/2008  . DM 04/22/2008   borderline  . Glaucoma   . Hyperthyroidism   . Irritable bowel syndrome   . Personal history of colonic polyp - adenoma 03/03/2014  . Pure hypercholesterolemia 09/27/2007  . Unspecified hypothyroidism 04/22/2008    Patient Active Problem List   Diagnosis Date Noted  . Periumbilical abdominal tenderness without rebound tenderness 07/24/2019  . Diverticulitis of large intestine without perforation or abscess without bleeding 07/24/2019  . Hyperglycemia  08/07/2018  . 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 Left rotator cuff tear 06/19/2017  . Vaginitis and vulvovaginitis 06/09/2014  . Meningioma ([place]) 09/02/2013  . 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 Wellness examination 01/04/2012  . Asymptomatic postmenopausal status 03/03/2010  . PURE HYPERCHOLESTEROLEMIA 09/27/2007    Past Surgical History:  Procedure Laterality Date  . ABDOMINAL HYSTERECTOMY  1987 apx  . 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 BREAST SURGERY    . COLONOSCOPY    . TUBAL LIGATION    . VSD REPAIR  1996     OB History   No obstetric history on file.     Family History  Problem Relation Age of Onset  . Heart attack Father 27  . Hypertension Mother   . Cancer Maternal Grandmother        Uterine Cancer  . Cancer Paternal Grandmother        Stomach Cancer  . Colon cancer Neg Hx     Social History   Tobacco Use  . Smoking status: Never Smoker  . Smokeless tobacco: Never Used  Substance Use Topics  . Alcohol use: No  . Drug use: No    Home Medications Prior to Admission medications   Medication Sig Start Date End Date Taking? Authorizing Provider  acetaminophen (TYLENOL) 500 MG tablet Take 1,000 mg by mouth every 6 (six) hours as needed for moderate pain.    Yes [provider]  latanoprost (XALATAN) 0.005 % ophthalmic solution Place 1 drop into both eyes at bedtime.     Yes [provider]  levothyroxine (SYNTHROID) 75 MCG tablet Take 1 tablet by mouth once daily Patient taking differently: Take 75 mcg by mouth daily before breakfast.  10/22/19  Yes Janith Lima, MD  cephALEXin (KEFLEX) 500 MG capsule Take 1 capsule (500 mg total) by mouth 2 (two) times daily. 12/20/19   Maziyah Vessel, Wenda Overland, MD  fluticasone (FLONASE) 50 MCG/ACT nasal spray Place 2 sprays into both nostrils daily. Patient not taking: Reported on 12/20/2019 09/18/18   Marrian Salvage, FNP    phenazopyridine (PYRIDIUM) 200 MG tablet Take 1 tablet (200 mg total) by mouth 3 (three) times daily as needed for pain (bladder spasms). 12/20/19   Lilou Kneip, Wenda Overland, MD    Allergies    Atorvastatin, Raloxifene, Simvastatin, and Sulfamethoxazole  Review of Systems   Review of Systems  Gastrointestinal: Positive for abdominal pain.   All other systems reviewed and are negative except that which was mentioned in HPI  Physical Exam Updated Vital Signs BP (!) 153/75 (BP Location: Left Arm)   Pulse 96   Temp 97.9 F (36.6 C) (Oral)   Resp 16   Ht 5\' 1"  (1.549 m)   Wt 58.2 kg   SpO2 99%   BMI 24.26 kg/m   Physical Exam Vitals and nursing note reviewed.  Constitutional:      General: She is not in acute distress.    Appearance: She is well-developed.  HENT:     Head: Normocephalic and atraumatic.  Eyes:     Conjunctiva/sclera: Conjunctivae normal.     Pupils: Pupils are equal, round, and reactive to light.  Cardiovascular:     Rate and Rhythm: Normal rate and regular rhythm.     Heart sounds: Normal heart sounds. No murmur.  Pulmonary:     Effort: Pulmonary effort is normal.     Breath sounds: Normal breath sounds.  Abdominal:     General: Bowel sounds are normal. There is no distension.     Palpations: Abdomen is soft.     Tenderness: There is generalized abdominal tenderness. There is no guarding or rebound.  Musculoskeletal:     Cervical back: Neck supple.  Skin:    General: Skin is warm and dry.  Neurological:     Mental Status: She is alert and oriented to person, place, and time.     Comments: Fluent speech  Psychiatric:        Judgment: Judgment normal.     ED Results / Procedures / Treatments   Labs (all labs ordered are listed, but only abnormal results are displayed) Labs Reviewed  COMPREHENSIVE METABOLIC PANEL - Abnormal; Notable for the following components:      Result Value   Sodium 134 (*)    Glucose, Bld 140 (*)    Total Bilirubin 1.3  (*)    All other components within normal limits  URINALYSIS, ROUTINE W REFLEX MICROSCOPIC - Abnormal; Notable for the following components:   Color, Urine AMBER (*)    APPearance HAZY (*)    Ketones, ur 20 (*)    Protein, ur 100 (*)    Nitrite POSITIVE (*)    Leukocytes,Ua LARGE (*)    Bacteria, UA MANY (*)    Non Squamous Epithelial 0-5 (*)    All other components within normal limits  CBC WITH DIFFERENTIAL/PLATELET - Abnormal; Notable for the following components:  RBC 5.25 (*)    Hemoglobin 15.4 (*)    HCT 48.0 (*)    Lymphs Abs 0.6 (*)    All other components within normal limits  URINE CULTURE  LIPASE, BLOOD    EKG None  Radiology DG Abd 1 View  Result Date: 12/20/2019 CLINICAL DATA:  Abdominal pain and distention. EXAM: ABDOMEN - 1 VIEW COMPARISON:  CT scan 11/27/2019 FINDINGS: Supine abdomen shows mild gaseous distention of small bowel and colon without obstructive bowel gas pattern. Numerous diverticuli are seen along the colon with left-sided predominance. Visualized bony anatomy unremarkable. IMPRESSION: Nonspecific bowel gas pattern. Electronically Signed   By: Misty Stanley M.D.   On: 12/20/2019 10:21    Procedures Procedures (including critical care time)  Medications Ordered in ED Medications  sodium chloride flush (NS) 0.9 % injection 3 mL (3 mLs Intravenous Given 12/20/19 0944)  alum & mag hydroxide-simeth (MAALOX/MYLANTA) 200-200-20 MG/5ML suspension 30 mL (30 mLs Oral Given 12/20/19 1107)  cefTRIAXone (ROCEPHIN) 1 g in sodium chloride 0.9 % 100 mL IVPB (0 g Intravenous Stopped 12/20/19 1240)  phenazopyridine (PYRIDIUM) tablet 200 mg (200 mg Oral Given 12/20/19 1154)  ketorolac (TORADOL) 15 MG/ML injection 15 mg (15 mg Intravenous Given 12/20/19 1154)    ED Course  I have reviewed the triage vital signs and the nursing notes.  Pertinent labs & imaging results that were available during my care of the patient were reviewed by me and considered in my medical  decision making (see chart for details).    MDM Rules/Calculators/A&P                      Patient afebrile, hypertensive but otherwise reassuring vital signs.  She had generalized abdominal tenderness without peritonitis.  I reviewed her CT scan from a few days ago for the same pain which shows diverticulosis without evidence of diverticulitis.  No acute findings to explain her symptoms.  KUB shows no evidence of obstruction or free air.  Lab work today shows normal CMP and CBC, labs are reassuring against pancreatitis, hepatitis, or acute gallbladder pathology. Her UA is nitrite + and suggestive of infection. Chart review shows U culture on 2/5 was + for E Coli; pt was given augmentin for suspected diverticulitis but it's possible she was inadequately treated for UTI. On further questioning, she does admit that when her pain started a few days ago, it did feel like it was her bladder initially; she also reports some urinary frequency and dribbling urine after using the bathroom. I suspect her pain may be due to bladder spasms. Her appendix was normal on recent CT. Gave CTX, toradol, and pyridium.   On reassessment, she reports pain is much better. Will start on course of abx and gave pyridium to use at home. Discussed PCP f/u for recheck and reviewed return precautions. Final Clinical Impression(s) / ED Diagnoses Final diagnoses:  Urinary tract infection without hematuria, site unspecified  Lower abdominal pain    Rx / DC Orders ED Discharge Orders         Ordered    cephALEXin (KEFLEX) 500 MG capsule  2 times daily     12/20/19 1301    phenazopyridine (PYRIDIUM) 200 MG tablet  3 times daily PRN     12/20/19 1301           Jaylene Arrowood, Wenda Overland, MD 12/20/19 1453

## 2019-12-22 LAB — URINE CULTURE: Culture: >=100000 — AB

## 2019-12-23 ENCOUNTER — Telehealth: Payer: Self-pay | Admitting: Emergency Medicine

## 2019-12-23 NOTE — Telephone Encounter (Signed)
Post ED Visit - Positive Culture Follow-up  Culture report reviewed by antimicrobial stewardship pharmacist: [place] Team []  [address] Pharm.D. []  Heide Guile, Pharm.D., BCPS AQ-ID []  Parks Neptune, Pharm.D., BCPS []  Alycia Rossetti, Pharm.D., BCPS []  [place], Pharm.D., BCPS, AAHIVP []  Legrand Como, Pharm.D., BCPS, AAHIVP []  Salome Arnt, PharmD, BCPS []  Johnnette Gourd, PharmD, BCPS []  Hughes Better, PharmD, BCPS []  Leeroy Cha, PharmD []  Laqueta Linden, PharmD, BCPS []  Albertina Parr, PharmD  [place] Team []  Leodis Sias, PharmD []  Lindell Spar, PharmD []  Royetta Asal, PharmD []  Graylin Shiver, Rph []  Rema Fendt) Glennon Mac, PharmD []  Arlyn Dunning, PharmD []  Netta Cedars, PharmD []  Dia Sitter, PharmD []  Leone Haven, PharmD []  Gretta Arab, PharmD []  Theodis Shove, PharmD []  Peggyann Juba, PharmD [x]  Reuel Boom, PharmD   Positive urine culture Treated with cephalexin, organism sensitive to the same and no further patient follow-up is required at this time.  Hazle Nordmann 12/23/2019, 1:27 PM

## 2019-12-29 ENCOUNTER — Encounter: Payer: Self-pay | Admitting: Family

## 2019-12-29 ENCOUNTER — Other Ambulatory Visit: Payer: Self-pay

## 2019-12-29 ENCOUNTER — Ambulatory Visit (INDEPENDENT_AMBULATORY_CARE_PROVIDER_SITE_OTHER): Payer: Medicare Other | Admitting: Family

## 2019-12-29 VITALS — BP 126/80 | HR 83 | Temp 98.3°F | Ht 61.0 in | Wt 125.6 lb

## 2019-12-29 DIAGNOSIS — N390 Urinary tract infection, site not specified: Secondary | ICD-10-CM | POA: Diagnosis not present

## 2019-12-29 DIAGNOSIS — R301 Vesical tenesmus: Secondary | ICD-10-CM | POA: Diagnosis not present

## 2019-12-29 LAB — POC URINALSYSI DIPSTICK (AUTOMATED)
Bilirubin, UA: NEGATIVE
Blood, UA: NEGATIVE
Glucose, UA: NEGATIVE
Ketones, UA: NEGATIVE
Nitrite, UA: NEGATIVE
Protein, UA: NEGATIVE
Spec Grav, UA: 1.01
Urobilinogen, UA: 0.2 U/dL
pH, UA: 6

## 2019-12-29 MED ORDER — PHENAZOPYRIDINE HCL 200 MG PO TABS: 200.0000 mg | ORAL_TABLET | Freq: Three times a day (TID) | ORAL | 0 refills | Status: DC | PRN

## 2019-12-29 MED ORDER — NITROFURANTOIN MONOHYD MACRO 100 MG PO CAPS: 100.0000 mg | ORAL_CAPSULE | Freq: Two times a day (BID) | ORAL | 0 refills | Status: DC

## 2019-12-29 NOTE — Progress Notes (Signed)
Mia Newman is a 74 y.o. female with the following history as recorded in EpicCare:  Patient Active Problem List   Diagnosis Date Noted  . Periumbilical abdominal tenderness without rebound tenderness 07/24/2019  . Diverticulitis of large intestine without perforation or abscess without bleeding 07/24/2019  . Hyperglycemia 08/07/2018  . 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 Left rotator cuff tear 06/19/2017  . Vaginitis and vulvovaginitis 06/09/2014  . Meningioma ([place]) 09/02/2013  . 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 Wellness examination 01/04/2012  . Asymptomatic postmenopausal status 03/03/2010  . PURE HYPERCHOLESTEROLEMIA 09/27/2007    Current Outpatient Medications  Medication Sig Dispense Refill  . acetaminophen (TYLENOL) 500 MG tablet Take 1,000 mg by mouth every 6 (six) hours as needed for moderate pain.     Marland Kitchen latanoprost (XALATAN) 0.005 % ophthalmic solution Place 1 drop into both eyes at bedtime.      Marland Kitchen levothyroxine (SYNTHROID) 75 MCG tablet Take 1 tablet by mouth once daily (Patient taking differently: Take 75 mcg by mouth daily before breakfast. ) 90 tablet 0  . phenazopyridine (PYRIDIUM) 200 MG tablet Take 1 tablet (200 mg total) by mouth 3 (three) times daily as needed for pain (bladder spasms). 6 tablet 0  . fluticasone (FLONASE) 50 MCG/ACT nasal spray Place 2 sprays into both nostrils daily. (Patient not taking: Reported on 12/20/2019) 16 g 6  . nitrofurantoin, macrocrystal-monohydrate, (MACROBID) 100 MG capsule Take 1 capsule (100 mg total) by mouth 2 (two) times daily. 20 capsule 0   No current facility-administered medications for this visit.    Allergies: Atorvastatin, Raloxifene, Simvastatin, and Sulfamethoxazole  Past Medical History:  Diagnosis Date  . Allergy   . Arthritis   . ASYMPTOMATIC POSTMENOPAUSAL STATUS 03/03/2010  . DIVERTICULITIS-COLON 05/22/2008  . DIVERTICULOSIS, COLON 05/20/2008  . DM 04/22/2008   borderline   . Glaucoma   . Hyperthyroidism   . Irritable bowel syndrome   . Personal history of colonic polyp - adenoma 03/03/2014  . Pure hypercholesterolemia 09/27/2007  . Unspecified hypothyroidism 04/22/2008    Past Surgical History:  Procedure Laterality Date  . ABDOMINAL HYSTERECTOMY  1987 apx  . 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 BREAST SURGERY    . COLONOSCOPY    . TUBAL LIGATION    . VSD REPAIR  1996    Family History  Problem Relation Age of Onset  . Heart attack Father 38  . Hypertension Mother   . Cancer Maternal Grandmother        Uterine Cancer  . Cancer Paternal Grandmother        Stomach Cancer  . Colon cancer Neg Hx     Social History   Tobacco Use  . Smoking status: Never Smoker  . Smokeless tobacco: Never Used  Substance Use Topics  . Alcohol use: No    Subjective:  Seen at the ER on 12/20/19 with UTI; according to culture, appropriate treatment was prescribed; patient noted she is still having burning/ frequency; notes that feels like her bladder is continuing to spasm; pain is localized over pubic region; CT and KUB have been normal;      Objective:  Vitals:   12/29/19 1131  BP: 126/80  Pulse: 83  Temp: 98.3 F (36.8 C)  TempSrc: Oral  SpO2: 97%  Weight: 125 lb 9.6 oz (57 kg)  Height: 5\' 1"  (1.549 m)    General: Well developed, well nourished, in no acute distress  Skin : Warm and  dry.  Head: Normocephalic and atraumatic  Lungs: Respirations unlabored; clear to auscultation bilaterally without wheeze, rales, rhonchi  Neurologic: Alert and oriented; speech intact; face symmetrical; moves all extremities well; CNII-XII intact without focal deficit   Assessment:  1. Urinary tract infection without hematuria, site unspecified   2. Painful bladder spasm     Plan:  Update urine culture- re-treat with different antibiotic; refill updated for Pyridium; refer to urology for further evaluation;  This visit occurred during  the SARS-CoV-2 public health emergency.  Safety protocols were in place, including screening questions prior to the visit, additional usage of staff PPE, and extensive cleaning of exam room while observing appropriate contact time as indicated for disinfecting solutions.      No follow-ups on file.  Orders Placed This Encounter  Procedures  . Urine Culture  . Ambulatory referral to Urology    Referral Priority:   Urgent    Referral Type:   Consultation    Referral Reason:   Specialty Services Required    Requested Specialty:   Urology    Number of Visits Requested:   1    Requested Prescriptions   Signed Prescriptions Disp Refills  . phenazopyridine (PYRIDIUM) 200 MG tablet 6 tablet 0    Sig: Take 1 tablet (200 mg total) by mouth 3 (three) times daily as needed for pain (bladder spasms).  . nitrofurantoin, macrocrystal-monohydrate, (MACROBID) 100 MG capsule 20 capsule 0    Sig: Take 1 capsule (100 mg total) by mouth 2 (two) times daily.

## 2019-12-29 NOTE — Addendum Note (Signed)
Addended by: Marcina Millard on: 12/29/2019 01:54 PM   Modules accepted: Orders

## 2019-12-30 LAB — URINE CULTURE: Result:: NO GROWTH

## 2020-01-01 ENCOUNTER — Ambulatory Visit: Payer: Medicare Other | Attending: Internal Medicine

## 2020-01-01 DIAGNOSIS — Z23 Encounter for immunization: Secondary | ICD-10-CM

## 2020-01-01 NOTE — Progress Notes (Signed)
   Covid-19 Vaccination Clinic  Name:  JAIMARIE COSLEY    MRN: [id number] DOB: 07/19/1946  01/01/2020  Ms. Jacobowitz was observed post Covid-19 immunization for 15 minutes without incident. She was provided with Vaccine Information Sheet and instruction to access the V-Safe system.   Ms. Hafen was instructed to call 911 with any severe reactions post vaccine: Marland Kitchen Difficulty breathing  . Swelling of face and throat  . A fast heartbeat  . A bad rash all over body  . Dizziness and weakness   Immunizations Administered    Name Date Dose VIS Date Route   Pfizer COVID-19 Vaccine 01/01/2020 11:12 AM 0.3 mL 10/03/2019 Intramuscular   Manufacturer: [place]   Lot: [id number]   [place]: [id number]

## 2020-01-01 NOTE — Progress Notes (Signed)
   Covid-19 Vaccination Clinic  Name:  Mia Newman    MRN: [id number] DOB: 28-Apr-1946  01/01/2020  [name] was observed post Covid-19 immunization for 15 minutes without incident. She was provided with Vaccine Information Sheet and instruction to access the V-Safe system.   [name] was instructed to call 911 with any severe reactions post vaccine: Marland Kitchen Difficulty breathing  . Swelling of face and throat  . A fast heartbeat  . A bad rash all over body  . Dizziness and weakness   Immunizations Administered    Name Date Dose VIS Date Route   Pfizer COVID-19 Vaccine 01/01/2020 11:12 AM 0.3 mL 10/03/2019 Intramuscular   Manufacturer: [place]   Lot: [id number]   [place]: [id number]

## 2020-01-19 DIAGNOSIS — N3020 Other chronic cystitis without hematuria: Secondary | ICD-10-CM | POA: Diagnosis not present

## 2020-01-19 DIAGNOSIS — N3000 Acute cystitis without hematuria: Secondary | ICD-10-CM | POA: Diagnosis not present

## 2020-01-26 ENCOUNTER — Ambulatory Visit: Payer: Medicare Other | Attending: Internal Medicine

## 2020-01-26 DIAGNOSIS — Z23 Encounter for immunization: Secondary | ICD-10-CM

## 2020-01-26 NOTE — Progress Notes (Signed)
   Covid-19 Vaccination Clinic  Name:  Mia Newman    MRN: [id number] DOB: [birth year]  01/26/2020  [name] was observed post Covid-19 immunization for 15 minutes without incident. She was provided with Vaccine Information Sheet and instruction to access the V-Safe system.   [name] was instructed to call 911 with any severe reactions post vaccine: Marland Kitchen Difficulty breathing  . Swelling of face and throat  . A fast heartbeat  . A bad rash all over body  . Dizziness and weakness   Immunizations Administered    Name Date Dose VIS Date Route   Pfizer COVID-19 Vaccine 01/26/2020  9:40 AM 0.3 mL 10/03/2019 Intramuscular   Manufacturer: [place]   Lot: [id number]   [place]: [id number]

## 2020-01-29 ENCOUNTER — Telehealth: Payer: Self-pay | Admitting: Family

## 2020-01-29 DIAGNOSIS — E890 Postprocedural hypothyroidism: Secondary | ICD-10-CM

## 2020-01-29 MED ORDER — LEVOTHYROXINE SODIUM 75 MCG PO TABS: 75.0000 ug | ORAL_TABLET | Freq: Every day | ORAL | 1 refills | Status: DC

## 2020-01-29 NOTE — Telephone Encounter (Signed)
Reviewed chart pt is up-to-date sent refills to [facility].../lmb  

## 2020-01-29 NOTE — Telephone Encounter (Signed)
   1.Medication Requested: levothyroxine (SYNTHROID) 75 MCG tablet  2. Pharmacy (Name, Street, [place]):[facility] [address]  3. On Med List: yes  4. Last Visit with PCP:  12/29/19  5. Next visit date with PCP:   Agent: Please be advised that RX refills may take up to 3 business days. We ask that you follow-up with your pharmacy.

## 2020-02-12 DIAGNOSIS — N3020 Other chronic cystitis without hematuria: Secondary | ICD-10-CM | POA: Diagnosis not present

## 2020-02-12 DIAGNOSIS — N8110 Cystocele, unspecified: Secondary | ICD-10-CM | POA: Diagnosis not present

## 2020-03-05 ENCOUNTER — Encounter (HOSPITAL_COMMUNITY): Payer: Self-pay

## 2020-03-05 ENCOUNTER — Ambulatory Visit (HOSPITAL_COMMUNITY)
Admission: EM | Admit: 2020-03-05 | Discharge: 2020-03-05 | Disposition: A | Discharge status: Home / Self Care | Payer: Medicare Other | Attending: Family Medicine | Admitting: Family Medicine

## 2020-03-05 ENCOUNTER — Other Ambulatory Visit: Payer: Self-pay

## 2020-03-05 DIAGNOSIS — L03011 Cellulitis of right finger: Secondary | ICD-10-CM

## 2020-03-05 DIAGNOSIS — Z23 Encounter for immunization: Secondary | ICD-10-CM

## 2020-03-05 MED ORDER — TETANUS-DIPHTH-ACELL PERTUSSIS 5-2.5-18.5 LF-MCG/0.5 IM SUSP
INTRAMUSCULAR | Status: AC
  Filled 2020-03-05: qty 0.5

## 2020-03-05 MED ORDER — CEPHALEXIN 500 MG PO CAPS: 500.0000 mg | ORAL_CAPSULE | Freq: Three times a day (TID) | ORAL | 0 refills | Status: AC

## 2020-03-05 MED ORDER — TETANUS-DIPHTH-ACELL PERTUSSIS 5-2.5-18.5 LF-MCG/0.5 IM SUSP
0.5000 mL | Freq: Once | INTRAMUSCULAR | Status: AC
  Administered 2020-03-05: 0.5 mL via INTRAMUSCULAR

## 2020-03-05 NOTE — Discharge Instructions (Addendum)
Warm soaks 2 x a day Take antibiotic as directed Return for problems

## 2020-03-05 NOTE — ED Triage Notes (Signed)
Patient reports bumping right thumb on towel rack. Noticed Wednesday swelling, redness.

## 2020-03-05 NOTE — ED Provider Notes (Signed)
[facility]    CSN: [id number] Arrival date & time: 03/05/20  1044      History   Chief Complaint Chief Complaint  Patient presents with  . Finger Injury    HPI Mia Newman is a 74 y.o. female.   HPI  Patient has a painful infection at the edge of her thumbnail.  She thinks is from bumping her thumb a few days ago.  Its been getting worse for the last 2 to 3 days.  She has not taken any medicine for the condition. Patient is otherwise in good health.  Past Medical History:  Diagnosis Date  . Allergy   . Arthritis   . ASYMPTOMATIC POSTMENOPAUSAL STATUS 03/03/2010  . DIVERTICULITIS-COLON 05/22/2008  . DIVERTICULOSIS, COLON 05/20/2008  . DM 04/22/2008   borderline  . Glaucoma   . Hyperthyroidism   . Irritable bowel syndrome   . Personal history of colonic polyp - adenoma 03/03/2014  . Pure hypercholesterolemia 09/27/2007  . Unspecified hypothyroidism 04/22/2008    Patient Active Problem List   Diagnosis Date Noted  . Periumbilical abdominal tenderness without rebound tenderness 07/24/2019  . Diverticulitis of large intestine without perforation or abscess without bleeding 07/24/2019  . Hyperglycemia 08/07/2018  . 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 Left rotator cuff tear 06/19/2017  . Vaginitis and vulvovaginitis 06/09/2014  . Meningioma ([place]) 09/02/2013  . 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 Wellness examination 01/04/2012  . Asymptomatic postmenopausal status 03/03/2010  . PURE HYPERCHOLESTEROLEMIA 09/27/2007    Past Surgical History:  Procedure Laterality Date  . ABDOMINAL HYSTERECTOMY  1987 apx  . 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 BREAST SURGERY    . COLONOSCOPY    . TUBAL LIGATION    . VSD REPAIR  1996    OB History   No obstetric history on file.      Home Medications    Prior to Admission medications   Medication Sig Start Date End Date Taking? Authorizing  Provider  levothyroxine (SYNTHROID) 75 MCG tablet Take 1 tablet (75 mcg total) by mouth daily before breakfast. 01/29/20  Yes Marrian Salvage, FNP  acetaminophen (TYLENOL) 500 MG tablet Take 1,000 mg by mouth every 6 (six) hours as needed for moderate pain.     [provider]  cephALEXin (KEFLEX) 500 MG capsule Take 1 capsule (500 mg total) by mouth 3 (three) times daily for 5 days. 03/05/20 03/10/20  Raylene Everts, MD  latanoprost (XALATAN) 0.005 % ophthalmic solution Place 1 drop into both eyes at bedtime.      [provider]  fluticasone (FLONASE) 50 MCG/ACT nasal spray Place 2 sprays into both nostrils daily. Patient not taking: Reported on 12/20/2019 09/18/18 03/05/20  Marrian Salvage, FNP    Family History Family History  Problem Relation Age of Onset  . Heart attack Father 33  . Hypertension Mother   . Cancer Maternal Grandmother        Uterine Cancer  . Cancer Paternal Grandmother        Stomach Cancer  . Colon cancer Neg Hx     Social History Social History   Tobacco Use  . Smoking status: Never Smoker  . Smokeless tobacco: Never Used  Substance Use Topics  . Alcohol use: No  . Drug use: No     Allergies   Atorvastatin, Raloxifene, Simvastatin, and Sulfamethoxazole   Review of Systems Review of Systems  Skin: Positive for wound.  Physical Exam Triage Vital Signs ED Triage Vitals  Enc Vitals Group     BP 03/05/20 1135 (!) 184/79     Pulse Rate 03/05/20 1135 76     Resp 03/05/20 1135 16     Temp 03/05/20 1135 98.1 F (36.7 C)     Temp Source 03/05/20 1135 Oral     SpO2 03/05/20 1135 98 %     Weight --      Height --      Head Circumference --      Peak Flow --      Pain Score 03/05/20 1141 6     Pain Loc --      Pain Edu? --      Excl. in [place]? --    No data found.  Updated Vital Signs BP (!) 184/79 (BP Location: Left Arm)   Pulse 76   Temp 98.1 F (36.7 C) (Oral)   Resp 16   SpO2 98%      Physical  Exam Constitutional:      General: She is not in acute distress.    Appearance: She is well-developed.  HENT:     Head: Normocephalic and atraumatic.  Eyes:     Conjunctiva/sclera: Conjunctivae normal.     Pupils: Pupils are equal, round, and reactive to light.  Cardiovascular:     Rate and Rhythm: Normal rate.  Pulmonary:     Effort: Pulmonary effort is normal. No respiratory distress.  Abdominal:     General: There is no distension.     Palpations: Abdomen is soft.  Musculoskeletal:        General: Normal range of motion.     Cervical back: Normal range of motion.  Skin:    General: Skin is warm and dry.     Comments: Yellow purulence is visible under the lateral edge of the nail with surrounding erythema and swelling.  This is easily cleaned and then opened with an 18-gauge needle.  Neurological:     General: No focal deficit present.     Mental Status: She is alert.  Psychiatric:        Mood and Affect: Mood normal.        Behavior: Behavior normal.      UC Treatments / Results  Labs (all labs ordered are listed, but only abnormal results are displayed) Labs Reviewed - No data to display  EKG   Radiology No results found.  Procedures Procedures (including critical care time)  Medications Ordered in UC Medications  Tdap (BOOSTRIX) injection 0.5 mL (0.5 mLs Intramuscular Given 03/05/20 1238)    Initial Impression / Assessment and Plan / UC Course  I have reviewed the triage vital signs and the nursing notes.  Pertinent labs & imaging results that were available during my care of the patient were reviewed by me and considered in my medical decision making (see chart for details).     Wound care discussed.  Last tetanus was 2003 so this is updated.  Return as needed Final Clinical Impressions(s) / UC Diagnoses   Final diagnoses:  Acute paronychia of thumb, right     Discharge Instructions     Warm soaks 2 x a day Take antibiotic as directed Return  for problems   ED Prescriptions    Medication Sig Dispense Auth. Provider   cephALEXin (KEFLEX) 500 MG capsule Take 1 capsule (500 mg total) by mouth 3 (three) times daily for 5 days. 15 capsule Raylene Everts, MD  PDMP not reviewed this encounter.   Raylene Everts, MD 03/05/20 [phone]

## 2020-03-19 DIAGNOSIS — N3020 Other chronic cystitis without hematuria: Secondary | ICD-10-CM | POA: Diagnosis not present

## 2020-07-22 ENCOUNTER — Other Ambulatory Visit: Payer: Self-pay | Admitting: Family

## 2020-07-22 DIAGNOSIS — E890 Postprocedural hypothyroidism: Secondary | ICD-10-CM

## 2020-08-21 ENCOUNTER — Ambulatory Visit: Payer: Medicare Other | Attending: Internal Medicine

## 2020-08-21 ENCOUNTER — Other Ambulatory Visit: Payer: Self-pay

## 2020-08-21 DIAGNOSIS — Z23 Encounter for immunization: Secondary | ICD-10-CM

## 2020-08-21 NOTE — Progress Notes (Signed)
   Covid-19 Vaccination Clinic  Name:  Mia Newman    MRN: [id number] DOB: [birth year]  08/21/2020  Mia Newman was observed post Covid-19 immunization for 15 minutes without incident. She was provided with Vaccine Information Sheet and instruction to access the V-Safe system.   Mia Newman was instructed to call 911 with any severe reactions post vaccine: Marland Kitchen Difficulty breathing  . Swelling of face and throat  . A fast heartbeat  . A bad rash all over body  . Dizziness and weakness

## 2020-09-06 DIAGNOSIS — H5340 Unspecified visual field defects: Secondary | ICD-10-CM | POA: Diagnosis not present

## 2020-09-06 DIAGNOSIS — H401232 Low-tension glaucoma, bilateral, moderate stage: Secondary | ICD-10-CM | POA: Diagnosis not present

## 2020-10-22 ENCOUNTER — Other Ambulatory Visit: Payer: Self-pay | Admitting: Family

## 2020-10-22 DIAGNOSIS — E890 Postprocedural hypothyroidism: Secondary | ICD-10-CM

## 2020-10-27 ENCOUNTER — Other Ambulatory Visit: Payer: Self-pay | Admitting: Family

## 2020-10-27 DIAGNOSIS — E890 Postprocedural hypothyroidism: Secondary | ICD-10-CM

## 2020-12-02 ENCOUNTER — Other Ambulatory Visit: Payer: Self-pay

## 2020-12-02 ENCOUNTER — Encounter (HOSPITAL_COMMUNITY): Payer: Self-pay

## 2020-12-02 ENCOUNTER — Ambulatory Visit (HOSPITAL_COMMUNITY)
Admission: EM | Admit: 2020-12-02 | Discharge: 2020-12-02 | Disposition: A | Discharge status: Home / Self Care | Payer: Medicare Other | Attending: Urgent Care | Admitting: Urgent Care

## 2020-12-02 DIAGNOSIS — R1032 Left lower quadrant pain: Secondary | ICD-10-CM | POA: Diagnosis not present

## 2020-12-02 DIAGNOSIS — Z8719 Personal history of other diseases of the digestive system: Secondary | ICD-10-CM

## 2020-12-02 DIAGNOSIS — K5730 Diverticulosis of large intestine without perforation or abscess without bleeding: Secondary | ICD-10-CM | POA: Diagnosis not present

## 2020-12-02 LAB — POCT URINALYSIS DIPSTICK, ED / UC
Bilirubin Urine: NEGATIVE
Glucose, UA: NEGATIVE mg/dL
Hgb urine dipstick: NEGATIVE
Ketones, ur: NEGATIVE mg/dL
Nitrite: NEGATIVE
Protein, ur: NEGATIVE mg/dL
Specific Gravity, Urine: 1.015 (ref 1.005–1.030)
Urobilinogen, UA: 0.2 mg/dL (ref 0.0–1.0)
pH: 7 (ref 5.0–8.0)

## 2020-12-02 NOTE — Discharge Instructions (Addendum)
Please just use Tylenol at a dose of 500mg -650mg  once every 6 hours as needed for your aches, pains, fevers. Do not use any nonsteroidal anti-inflammatories (NSAIDs) like ibuprofen, Motrin, naproxen, Aleve, etc. which are all available over-the-counter.  Check with your regular doctor to see if they can order an outpatient CT scan for a recurring diverticulitis. If they cannot and cannot see you soon then please go to the emergency room for a more urgent CT scan to rule out diverticulitis.

## 2020-12-02 NOTE — ED Provider Notes (Signed)
[place]   MRN: [id number] DOB: [birth year]  Subjective:   Mia Newman is a 75 y.o. female presenting for persistent intermittent lower abdominal pain that is typically worse in the morning and improves throughout the day.  Patient has a history of diverticulitis, diffuse diverticulosis throughout her colon.  Denies fever, chest pain, shortness of breath, nausea, vomiting, diarrhea, constipation, bloody stools.  She contacted her PCP but was unable to get in with them.  Has not tried medications for relief of her symptoms.  Currently she has no belly pain in clinic.  No current facility-administered medications for this encounter.  Current Outpatient Medications:  .  acetaminophen (TYLENOL) 500 MG tablet, Take 1,000 mg by mouth every 6 (six) hours as needed for moderate pain. , Disp: , Rfl:  .  EUTHYROX 75 MCG tablet, TAKE 1 TABLET BY MOUTH ONCE DAILY BEFORE BREAKFAST, Disp: 90 tablet, Rfl: 0 .  latanoprost (XALATAN) 0.005 % ophthalmic solution, Place 1 drop into both eyes at bedtime.  , Disp: , Rfl:    Allergies  Allergen Reactions  . Atorvastatin Other (See Comments)    Myalgias  . Raloxifene Other (See Comments)    Unknown rxn  . Simvastatin Other (See Comments)    Myalgias  . Sulfamethoxazole Other (See Comments)    Unknown rxn    Past Medical History:  Diagnosis Date  . Allergy   . Arthritis   . ASYMPTOMATIC POSTMENOPAUSAL STATUS 03/03/2010  . DIVERTICULITIS-COLON 05/22/2008  . DIVERTICULOSIS, COLON 05/20/2008  . DM 04/22/2008   borderline  . Glaucoma   . Hyperthyroidism   . Irritable bowel syndrome   . Personal history of colonic polyp - adenoma 03/03/2014  . Pure hypercholesterolemia 09/27/2007  . Unspecified hypothyroidism 04/22/2008     Past Surgical History:  Procedure Laterality Date  . ABDOMINAL HYSTERECTOMY  1987 apx  . 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 BREAST SURGERY    . COLONOSCOPY    . TUBAL  LIGATION    . VSD REPAIR  1996    Family History  Problem Relation Age of Onset  . Heart attack Father 66  . Hypertension Mother   . Cancer Maternal Grandmother        Uterine Cancer  . Cancer Paternal Grandmother        Stomach Cancer  . Colon cancer Neg Hx     Social History   Tobacco Use  . Smoking status: Never Smoker  . Smokeless tobacco: Never Used  Vaping Use  . Vaping Use: Never used  Substance Use Topics  . Alcohol use: No  . Drug use: No    ROS   Objective:   Vitals: BP 131/84 (BP Location: Right Arm)   Pulse 70   Temp 98 F (36.7 C) (Temporal)   Resp 16   Wt 125 lb (56.7 kg)   SpO2 99%   BMI 23.62 kg/m   Physical Exam Constitutional:      General: She is not in acute distress.    Appearance: Normal appearance. She is well-developed and normal weight. She is not ill-appearing, toxic-appearing or diaphoretic.  HENT:     Head: Normocephalic and atraumatic.     Right Ear: External ear normal.     Left Ear: External ear normal.     Nose: Nose normal.     Mouth/Throat:     Mouth: Mucous membranes are moist.     Pharynx: Oropharynx is  clear.  Eyes:     General: No scleral icterus.    Extraocular Movements: Extraocular movements intact.     Pupils: Pupils are equal, round, and reactive to light.  Cardiovascular:     Rate and Rhythm: Normal rate and regular rhythm.     Heart sounds: Normal heart sounds. No murmur heard. No friction rub. No gallop.   Pulmonary:     Effort: Pulmonary effort is normal. No respiratory distress.     Breath sounds: Normal breath sounds. No stridor. No wheezing, rhonchi or rales.  Abdominal:     General: Bowel sounds are normal. There is no distension.     Palpations: Abdomen is soft. There is no mass.     Tenderness: There is abdominal tenderness in the left lower quadrant. There is no right CVA tenderness, left CVA tenderness, guarding or rebound.  Skin:    General: Skin is warm and dry.     Coloration: Skin is not  pale.     Findings: No rash.  Neurological:     General: No focal deficit present.     Mental Status: She is alert and oriented to person, place, and time.  Psychiatric:        Mood and Affect: Mood normal.        Behavior: Behavior normal.        Thought Content: Thought content normal.        Judgment: Judgment normal.     Results for orders placed or performed during the hospital encounter of 12/02/20 (from the past 24 hour(s))  POC Urinalysis dipstick     Status: Abnormal   Collection Time: 12/02/20  5:04 PM  Result Value Ref Range   Glucose, UA NEGATIVE NEGATIVE mg/dL   Bilirubin Urine NEGATIVE NEGATIVE   Ketones, ur NEGATIVE NEGATIVE mg/dL   Specific Gravity, Urine 1.015 1.005 - 1.030   Hgb urine dipstick NEGATIVE NEGATIVE   pH 7.0 5.0 - 8.0   Protein, ur NEGATIVE NEGATIVE mg/dL   Urobilinogen, UA 0.2 0.0 - 1.0 mg/dL   Nitrite NEGATIVE NEGATIVE   Leukocytes,Ua TRACE (A) NEGATIVE    Assessment and Plan :   PDMP not reviewed this encounter.  1. Left lower quadrant abdominal pain   2. Diverticulosis of colon without hemorrhage   3. History of diverticulitis     Patient has a significant history of diffuse diverticulosis and episodes of diverticulitis.  She does have left lower quadrant pain on exam but otherwise vital signs are stable.  She is not keen on going to the emergency room now for CT scan to rule out acute diverticulitis.  Discussed risks of an untreated diverticulitis, patient prefers to try and obtain outpatient CT scan through her regular doctor.  States that she is going to try to talk with them about an urgent appointment.  In the meantime, counseled on appropriate use of pain medication such as Tylenol.  Discussed foods that are safe given her diagnosis.   Jaynee Eagles, PA-C 12/02/20 1721

## 2020-12-02 NOTE — ED Triage Notes (Signed)
Patient presents to Urgent Care with complaints of lower abdominal cramping and back pain x 3 weeks ago. Pt reports she has a hx of diverticulitis.   Denies fever, n/v, or hematuria.

## 2020-12-10 ENCOUNTER — Other Ambulatory Visit: Payer: Self-pay

## 2020-12-10 ENCOUNTER — Telehealth: Payer: Self-pay | Admitting: Family

## 2020-12-10 ENCOUNTER — Telehealth (INDEPENDENT_AMBULATORY_CARE_PROVIDER_SITE_OTHER): Payer: Medicare Other | Admitting: Family

## 2020-12-10 ENCOUNTER — Other Ambulatory Visit: Payer: Self-pay | Admitting: Family

## 2020-12-10 ENCOUNTER — Other Ambulatory Visit (INDEPENDENT_AMBULATORY_CARE_PROVIDER_SITE_OTHER): Payer: Medicare Other

## 2020-12-10 DIAGNOSIS — R1032 Left lower quadrant pain: Secondary | ICD-10-CM | POA: Diagnosis not present

## 2020-12-10 LAB — CBC WITH DIFFERENTIAL/PLATELET
Basophils Absolute: 0.1 10*3/uL (ref 0.0–0.1)
Basophils Relative: 0.8 % (ref 0.0–3.0)
Eosinophils Absolute: 0.2 10*3/uL (ref 0.0–0.7)
Eosinophils Relative: 2.7 % (ref 0.0–5.0)
HCT: 41.7 % (ref 36.0–46.0)
Hemoglobin: 13.9 g/dL (ref 12.0–15.0)
Lymphocytes Relative: 34.4 % (ref 12.0–46.0)
Lymphs Abs: 2.6 10*3/uL (ref 0.7–4.0)
MCHC: 33.3 g/dL (ref 30.0–36.0)
MCV: 90.6 fl (ref 78.0–100.0)
Monocytes Absolute: 0.6 10*3/uL (ref 0.1–1.0)
Monocytes Relative: 8.5 % (ref 3.0–12.0)
Neutro Abs: 4 10*3/uL (ref 1.4–7.7)
Neutrophils Relative %: 53.6 % (ref 43.0–77.0)
Platelets: 296 10*3/uL (ref 150.0–400.0)
RBC: 4.6 Mil/uL (ref 3.87–5.11)
RDW: 14 % (ref 11.5–15.5)
WBC: 7.5 10*3/uL (ref 4.0–10.5)

## 2020-12-10 LAB — COMPREHENSIVE METABOLIC PANEL
ALT: 12 U/L (ref 0–35)
AST: 12 U/L (ref 0–37)
Albumin: 4.1 g/dL (ref 3.5–5.2)
Alkaline Phosphatase: 57 U/L (ref 39–117)
BUN: 9 mg/dL (ref 6–23)
CO2: 29 mEq/L (ref 19–32)
Calcium: 9.3 mg/dL (ref 8.4–10.5)
Chloride: 104 mEq/L (ref 96–112)
Creatinine, Ser: 0.7 mg/dL (ref 0.40–1.20)
GFR: 85.47 mL/min (ref >60.00)
Glucose, Bld: 90 mg/dL (ref 70–99)
Potassium: 3.5 mEq/L (ref 3.5–5.1)
Sodium: 139 mEq/L (ref 135–145)
Total Bilirubin: 0.4 mg/dL (ref 0.2–1.2)
Total Protein: 7 g/dL (ref 6.0–8.3)

## 2020-12-10 NOTE — Telephone Encounter (Signed)
Please call her and let her know that she needs to go to [facility] to get labs as we discussed earlier this morning. [facility] will be calling her to get the CT scheduled and they need the labs done prior to scheduling. [facility] is open 7:30-5:30 pm;

## 2020-12-10 NOTE — Progress Notes (Signed)
Mia Newman is a 75 y.o. female with the following history as recorded in EpicCare:  Patient Active Problem List   Diagnosis Date Noted  . Periumbilical abdominal tenderness without rebound tenderness 07/24/2019  . Diverticulitis of large intestine without perforation or abscess without bleeding 07/24/2019  . Hyperglycemia 08/07/2018  . 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 Left rotator cuff tear 06/19/2017  . Vaginitis and vulvovaginitis 06/09/2014  . Meningioma ([place]) 09/02/2013  . 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 Wellness examination 01/04/2012  . Asymptomatic postmenopausal status 03/03/2010  . PURE HYPERCHOLESTEROLEMIA 09/27/2007    Current Outpatient Medications  Medication Sig Dispense Refill  . acetaminophen (TYLENOL) 500 MG tablet Take 1,000 mg by mouth every 6 (six) hours as needed for moderate pain.     Arna Medici 75 MCG tablet TAKE 1 TABLET BY MOUTH ONCE DAILY BEFORE BREAKFAST 90 tablet 0  . latanoprost (XALATAN) 0.005 % ophthalmic solution Place 1 drop into both eyes at bedtime.       No current facility-administered medications for this visit.    Allergies: Atorvastatin, Raloxifene, Simvastatin, and Sulfamethoxazole  Past Medical History:  Diagnosis Date  . Allergy   . Arthritis   . ASYMPTOMATIC POSTMENOPAUSAL STATUS 03/03/2010  . DIVERTICULITIS-COLON 05/22/2008  . DIVERTICULOSIS, COLON 05/20/2008  . DM 04/22/2008   borderline  . Glaucoma   . Hyperthyroidism   . Irritable bowel syndrome   . Personal history of colonic polyp - adenoma 03/03/2014  . Pure hypercholesterolemia 09/27/2007  . Unspecified hypothyroidism 04/22/2008    Past Surgical History:  Procedure Laterality Date  . ABDOMINAL HYSTERECTOMY  1987 apx  . 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 BREAST SURGERY    . COLONOSCOPY    . TUBAL LIGATION    . VSD REPAIR  1996    Family History  Problem Relation Age of Onset  . Heart attack  Father 94  . Hypertension Mother   . Cancer Maternal Grandmother        Uterine Cancer  . Cancer Paternal Grandmother        Stomach Cancer  . Colon cancer Neg Hx     Social History   Tobacco Use  . Smoking status: Never Smoker  . Smokeless tobacco: Never Used  Substance Use Topics  . Alcohol use: No    Subjective:   I connected with Mia Newman on 12/10/20 at 10:40 AM EST by a telephone call and verified that I am speaking with the correct person using two identifiers.   I discussed the limitations of evaluation and management by telemedicine and the availability of in person appointments. The patient expressed understanding and agreed to proceed. Provider in office/ patient is at home; provider and patient are only 2 people on telephone call.   Patient was seen at [facility] on 2/10 with LLQ pain and concerns for diverticulitis; it is unclear why she was not started on any treatment but the notes indicate that the patient told the [facility] provider that she wanted to follow-up with her PCP to discuss outpatient scheduling of CT as opposed to having to wait at [facility]; patient notes she has been starting to feel better but still has occasional left sided pain; having normal bowel movements. She is asking for outpatient CT to be scheduled.     Objective:  There were no vitals filed for this visit.  Lungs: Respirations unlabored;  Neurologic: Alert and oriented; speech intact; face symmetrical;  Assessment:  1. LLQ pain     Plan:  ? Diverticulitis; patient is now feeling better as her worst symptoms were over a week ago; will re-check CT to evaluate for source of pain; encouraged to drink water and eat fiber. Follow-up to be determined;  Time spent 12 minutes  No follow-ups on file.  Orders Placed This Encounter  Procedures  . CT Abdomen Pelvis W Contrast    Standing Status:   Future    Standing Expiration Date:   12/10/2021    Order Specific Question:   If indicated for the  ordered procedure, I authorize the administration of contrast media per Radiology protocol    Answer:   Yes    Order Specific Question:   Preferred imaging location?    Answer:   [facility]    Order Specific Question:   Is Oral Contrast requested for this exam?    Answer:   Yes, Per Radiology protocol    Requested Prescriptions    No prescriptions requested or ordered in this encounter

## 2020-12-10 NOTE — Telephone Encounter (Signed)
Unable to get in contact with the patient. LDVM with the instructions from Mickel Baas, NP. Office number was provided in case she has any other questions or concerns.

## 2020-12-22 ENCOUNTER — Ambulatory Visit
Admission: RE | Admit: 2020-12-22 | Discharge: 2020-12-22 | Disposition: A | Discharge status: Home / Self Care | Payer: Medicare Other | Source: Ambulatory Visit | Attending: Family | Admitting: Family

## 2020-12-22 DIAGNOSIS — K5730 Diverticulosis of large intestine without perforation or abscess without bleeding: Secondary | ICD-10-CM | POA: Diagnosis not present

## 2020-12-22 DIAGNOSIS — R1032 Left lower quadrant pain: Secondary | ICD-10-CM

## 2020-12-22 DIAGNOSIS — Z90710 Acquired absence of both cervix and uterus: Secondary | ICD-10-CM | POA: Diagnosis not present

## 2020-12-22 MED ORDER — IOPAMIDOL (ISOVUE-300) INJECTION 61%
100.0000 mL | Freq: Once | INTRAVENOUS | Status: AC | PRN
  Administered 2020-12-22: 100 mL via INTRAVENOUS

## 2020-12-27 ENCOUNTER — Telehealth: Payer: Self-pay | Admitting: Family

## 2020-12-27 NOTE — Telephone Encounter (Signed)
Patient is requesting a call back in regards to recent CT scan. She can be reached at [phone]

## 2020-12-27 NOTE — Telephone Encounter (Signed)
Result note given   No acute changes noted; she does have extensive diverticulosis but there is no acute infection at this time. We may want to consider having her meet with a GI surgeon if she continues to have the recurrent episodes.

## 2020-12-31 ENCOUNTER — Encounter: Payer: Self-pay | Admitting: Internal Medicine

## 2020-12-31 ENCOUNTER — Telehealth: Payer: Self-pay | Admitting: *Deleted

## 2020-12-31 NOTE — Telephone Encounter (Signed)
Noted  

## 2020-12-31 NOTE — Telephone Encounter (Signed)
Dr.Gessner,  This patient is scheduled for a recall colon with you on 4/19. Last colon 2015, polyp. Last GI OV 2017. Patient has recently been seen by her PCP for LLQ pain and a CT scan was done on 12/23/2020, "no acute findings". Patient has hx of diverticulitis. Just wanted to let you know. Ok to proceed with colon? Thanks,Sammie Schermerhorn PV

## 2020-12-31 NOTE — Telephone Encounter (Signed)
OK to proceed

## 2021-01-04 DIAGNOSIS — H401232 Low-tension glaucoma, bilateral, moderate stage: Secondary | ICD-10-CM | POA: Diagnosis not present

## 2021-01-04 DIAGNOSIS — H2513 Age-related nuclear cataract, bilateral: Secondary | ICD-10-CM | POA: Diagnosis not present

## 2021-01-17 ENCOUNTER — Telehealth: Payer: Self-pay | Admitting: Family

## 2021-01-17 NOTE — Telephone Encounter (Signed)
[facility]:  Caller states she is needing an antibiotic for diverticulitis.She is cramping and experiencing some back pain. Pt is having left side pain into her back. Pt not running a fever. MRI showed diverticulitis. No blood or diarrhea in her stool  [facility] advised to see PCP within 24 hours

## 2021-01-17 NOTE — Telephone Encounter (Signed)
Called pt to follow up on her to see how she is feeling today. She stated that she did speak to someone at the [facility] office and now has an appointment to see laura at 12:20 tomorrow.   Pt reports that her cramping is better today and she is still having some back pain. She feels like she has strained her back taking care of her 74 yr old mom. She also reports that she is scheduled for a colonoscopy at the end of April.   FYI to provider.

## 2021-01-18 ENCOUNTER — Other Ambulatory Visit: Payer: Self-pay

## 2021-01-18 ENCOUNTER — Encounter: Payer: Self-pay | Admitting: Family

## 2021-01-18 ENCOUNTER — Ambulatory Visit (INDEPENDENT_AMBULATORY_CARE_PROVIDER_SITE_OTHER): Payer: Medicare Other | Admitting: Family

## 2021-01-18 ENCOUNTER — Ambulatory Visit (AMBULATORY_SURGERY_CENTER): Payer: Self-pay

## 2021-01-18 VITALS — BP 150/70 | HR 60 | Temp 97.9°F | Ht 60.0 in | Wt 124.8 lb

## 2021-01-18 VITALS — Ht 61.0 in | Wt 126.4 lb

## 2021-01-18 DIAGNOSIS — M545 Low back pain, unspecified: Secondary | ICD-10-CM

## 2021-01-18 DIAGNOSIS — Z860100 Personal history of colon polyps, unspecified: Secondary | ICD-10-CM

## 2021-01-18 DIAGNOSIS — E890 Postprocedural hypothyroidism: Secondary | ICD-10-CM

## 2021-01-18 LAB — CBC WITH DIFFERENTIAL/PLATELET
Basophils Absolute: 0.1 10*3/uL (ref 0.0–0.1)
Basophils Relative: 1.2 % (ref 0.0–3.0)
Eosinophils Absolute: 0.2 10*3/uL (ref 0.0–0.7)
Eosinophils Relative: 3.7 % (ref 0.0–5.0)
HCT: 43.1 % (ref 36.0–46.0)
Hemoglobin: 14.3 g/dL (ref 12.0–15.0)
Lymphocytes Relative: 34.4 % (ref 12.0–46.0)
Lymphs Abs: 1.9 10*3/uL (ref 0.7–4.0)
MCHC: 33.2 g/dL (ref 30.0–36.0)
MCV: 90.7 fl (ref 78.0–100.0)
Monocytes Absolute: 0.5 10*3/uL (ref 0.1–1.0)
Monocytes Relative: 8.7 % (ref 3.0–12.0)
Neutro Abs: 2.8 10*3/uL (ref 1.4–7.7)
Neutrophils Relative %: 52 % (ref 43.0–77.0)
Platelets: 265 10*3/uL (ref 150.0–400.0)
RBC: 4.75 Mil/uL (ref 3.87–5.11)
RDW: 14.6 % (ref 11.5–15.5)
WBC: 5.5 10*3/uL (ref 4.0–10.5)

## 2021-01-18 LAB — COMPREHENSIVE METABOLIC PANEL
ALT: 14 U/L (ref 0–35)
AST: 15 U/L (ref 0–37)
Albumin: 4.2 g/dL (ref 3.5–5.2)
Alkaline Phosphatase: 52 U/L (ref 39–117)
BUN: 11 mg/dL (ref 6–23)
CO2: 29 mEq/L (ref 19–32)
Calcium: 9.5 mg/dL (ref 8.4–10.5)
Chloride: 105 mEq/L (ref 96–112)
Creatinine, Ser: 0.66 mg/dL (ref 0.40–1.20)
GFR: 86.63 mL/min (ref >60.00)
Glucose, Bld: 93 mg/dL (ref 70–99)
Potassium: 4 mEq/L (ref 3.5–5.1)
Sodium: 141 mEq/L (ref 135–145)
Total Bilirubin: 0.3 mg/dL (ref 0.2–1.2)
Total Protein: 7.2 g/dL (ref 6.0–8.3)

## 2021-01-18 LAB — TSH: TSH: 0.09 u[IU]/mL — ABNORMAL LOW (ref 0.35–4.50)

## 2021-01-18 MED ORDER — METHOCARBAMOL 500 MG PO TABS: 500.0000 mg | ORAL_TABLET | Freq: Every evening | ORAL | 0 refills | Status: DC | PRN

## 2021-01-18 MED ORDER — MELOXICAM 15 MG PO TABS: 15.0000 mg | ORAL_TABLET | Freq: Every day | ORAL | 0 refills | Status: DC

## 2021-01-18 NOTE — Progress Notes (Signed)
Mia Newman is a 75 y.o. female with the following history as recorded in EpicCare:  Patient Active Problem List   Diagnosis Date Noted  . Periumbilical abdominal tenderness without rebound tenderness 07/24/2019  . Diverticulitis of large intestine without perforation or abscess without bleeding 07/24/2019  . Hyperglycemia 08/07/2018  . 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 Left rotator cuff tear 06/19/2017  . Vaginitis and vulvovaginitis 06/09/2014  . Meningioma ([place]) 09/02/2013  . 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 Wellness examination 01/04/2012  . Asymptomatic postmenopausal status 03/03/2010  . PURE HYPERCHOLESTEROLEMIA 09/27/2007    Current Outpatient Medications  Medication Sig Dispense Refill  . acetaminophen (TYLENOL) 500 MG tablet Take 1,000 mg by mouth every 6 (six) hours as needed for moderate pain.    . Ascorbic Acid (VITAMIN C) 1000 MG tablet Take 1,000 mg by mouth daily.    Arna Medici 75 MCG tablet TAKE 1 TABLET BY MOUTH ONCE DAILY BEFORE BREAKFAST 90 tablet 0  . latanoprost (XALATAN) 0.005 % ophthalmic solution Place 1 drop into both eyes at bedtime.    . meloxicam (MOBIC) 15 MG tablet Take 1 tablet (15 mg total) by mouth daily. 30 tablet 0  . methocarbamol (ROBAXIN) 500 MG tablet Take 1 tablet (500 mg total) by mouth at bedtime as needed. 30 tablet 0   No current facility-administered medications for this visit.    Allergies: Atorvastatin, Raloxifene, Simvastatin, and Sulfamethoxazole  Past Medical History:  Diagnosis Date  . Allergy   . Arthritis   . ASYMPTOMATIC POSTMENOPAUSAL STATUS 03/03/2010  . DIVERTICULITIS-COLON 05/22/2008  . DIVERTICULOSIS, COLON 05/20/2008  . DM 04/22/2008   borderline  . Glaucoma   . Hyperthyroidism   . Irritable bowel syndrome   . Personal history of colonic polyp - adenoma 03/03/2014  . Pure hypercholesterolemia 09/27/2007  . Unspecified hypothyroidism 04/22/2008    Past Surgical History:   Procedure Laterality Date  . ABDOMINAL HYSTERECTOMY  1987 apx  . 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 BREAST SURGERY    . COLONOSCOPY    . TUBAL LIGATION    . VSD REPAIR  1996    Family History  Problem Relation Age of Onset  . Heart attack Father 11  . Hypertension Mother   . Cancer Maternal Grandmother        Uterine Cancer  . Stomach cancer Paternal Grandmother 75  . Colon cancer Neg Hx   . Esophageal cancer Neg Hx     Social History   Tobacco Use  . Smoking status: Never Smoker  . Smokeless tobacco: Never Used  Substance Use Topics  . 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  Head: Normocephalic and atraumatic  Lungs: Respirations unlabored;  Musculoskeletal: No deformities; no active joint inflammation  Extremities: No edema, cyanosis, clubbing  Vessels: Symmetric bilaterally  Neurologic: Alert and oriented; speech intact; face symmetrical; moves all extremities well; CNII-XII  intact without focal deficit   Assessment:  1. Acute low back pain without sciatica, unspecified back pain laterality   2. Hypothyroidism following radioiodine therapy     Plan:  1. Rx for Mobic and Robaxin; follow-up worse, no better- to consider imaging and PT if symptoms persist; 2. Check CBC, CMP, TSH today;  This visit occurred during the SARS-CoV-2 public health emergency.  Safety protocols were in place, including screening questions prior to the visit, additional usage of staff PPE, and  extensive cleaning of exam room while observing appropriate contact time as indicated for disinfecting solutions.     No follow-ups on file.  Orders Placed This Encounter  Procedures  . CBC with Differential/Platelet    Standing Status:   Future    Number of Occurrences:   1    Standing Expiration Date:   01/18/2022  . Comp Met (CMET)    Standing Status:   Future    Number of Occurrences:   1    Standing Expiration Date:   01/18/2022  . TSH    Standing Status:   Future    Number of Occurrences:   1    Standing Expiration Date:   01/18/2022    Requested Prescriptions   Signed Prescriptions Disp Refills  . meloxicam (MOBIC) 15 MG tablet 30 tablet 0    Sig: Take 1 tablet (15 mg total) by mouth daily.  . methocarbamol (ROBAXIN) 500 MG tablet 30 tablet 0    Sig: Take 1 tablet (500 mg total) by mouth at bedtime as needed.

## 2021-01-18 NOTE — Progress Notes (Signed)

## 2021-01-19 ENCOUNTER — Other Ambulatory Visit: Payer: Self-pay | Admitting: Family

## 2021-01-19 DIAGNOSIS — E890 Postprocedural hypothyroidism: Secondary | ICD-10-CM

## 2021-01-19 MED ORDER — LEVOTHYROXINE SODIUM 50 MCG PO TABS: 50.0000 ug | ORAL_TABLET | Freq: Every day | ORAL | 0 refills | Status: DC

## 2021-02-08 ENCOUNTER — Encounter: Payer: Self-pay | Admitting: Internal Medicine

## 2021-02-08 ENCOUNTER — Ambulatory Visit (AMBULATORY_SURGERY_CENTER): Payer: Medicare Other | Admitting: Internal Medicine

## 2021-02-08 ENCOUNTER — Other Ambulatory Visit: Payer: Self-pay

## 2021-02-08 VITALS — BP 121/68 | HR 57 | Temp 96.8°F | Resp 13 | Ht 61.0 in | Wt 126.4 lb

## 2021-02-08 DIAGNOSIS — Z860100 Personal history of colon polyps, unspecified: Secondary | ICD-10-CM

## 2021-02-08 MED ORDER — SODIUM CHLORIDE 0.9 % IV SOLN: 500.0000 mL | Freq: Once | INTRAVENOUS | Status: DC

## 2021-02-08 NOTE — Progress Notes (Signed)
pt tolerated well. VSS. awake and to recovery. Report given to RN.  

## 2021-02-08 NOTE — Progress Notes (Signed)
Pt's states no medical or surgical changes since previsit or office visit.  [place] - vitals

## 2021-02-08 NOTE — Patient Instructions (Addendum)
No polyps or cancer seen.  You do have diverticulosis - thickened muscle rings and pouches in the colon wall. Please read the handout about this condition.  No need for routine repeat colonoscopy.  I appreciate the opportunity to care for you. Gatha Mayer, MD, [facility] Resume previous diet Continue current medications You have graduated from any further routine colonoscpies  YOU HAD AN ENDOSCOPIC PROCEDURE TODAY AT [facility]:   Refer to the procedure report that was given to you for any specific questions about what was found during the examination.  If the procedure report does not answer your questions, please call your gastroenterologist to clarify.  If you requested that your care partner not be given the details of your procedure findings, then the procedure report has been included in a sealed envelope for you to review at your convenience later.  YOU SHOULD EXPECT: Some feelings of bloating in the abdomen. Passage of more gas than usual.  Walking can help get rid of the air that was put into your GI tract during the procedure and reduce the bloating. If you had a lower endoscopy (such as a colonoscopy or flexible sigmoidoscopy) you may notice spotting of blood in your stool or on the toilet paper. If you underwent a bowel prep for your procedure, you may not have a normal bowel movement for a few days.  Please Note:  You might notice some irritation and congestion in your nose or some drainage.  This is from the oxygen used during your procedure.  There is no need for concern and it should clear up in a day or so.  SYMPTOMS TO REPORT IMMEDIATELY:   Following lower endoscopy (colonoscopy or flexible sigmoidoscopy):  Excessive amounts of blood in the stool  Significant tenderness or worsening of abdominal pains  Swelling of the abdomen that is new, acute  Fever of 100F or higher  For urgent or emergent issues, a gastroenterologist can be reached at any hour by  calling [phone]. Do not use MyChart messaging for urgent concerns.   DIET:  We do recommend a small meal at first, but then you may proceed to your regular diet.  Drink plenty of fluids but you should avoid alcoholic beverages for 24 hours.  ACTIVITY:  You should plan to take it easy for the rest of today and you should NOT DRIVE or use heavy machinery until tomorrow (because of the sedation medicines used during the test).    FOLLOW UP: Our staff will call the number listed on your records 48-72 hours following your procedure to check on you and address any questions or concerns that you may have regarding the information given to you following your procedure. If we do not reach you, we will leave a message.  We will attempt to reach you two times.  During this call, we will ask if you have developed any symptoms of COVID 19. If you develop any symptoms (ie: fever, flu-like symptoms, shortness of breath, cough etc.) before then, please call [phone].  If you test positive for Covid 19 in the 2 weeks post procedure, please call and report this information to [place].    If any biopsies were taken you will be contacted by phone or by letter within the next 1-3 weeks.  Please call us at [phone] if you have not heard about the biopsies in 3 weeks.  SIGNATURES/CONFIDENTIALITY: You and/or your care partner have signed paperwork which will be entered into your electronic  medical record.  These signatures attest to the fact that that the information above on your After Visit Summary has been reviewed and is understood.  Full responsibility of the confidentiality of this discharge information lies with you and/or your care-partner.

## 2021-02-08 NOTE — Op Note (Addendum)
[place] Patient Name: Mia Newman Procedure Date: 02/08/2021 2:28 PM MRN: [id number] Endoscopist: Gatha Mayer , MD Age: 75 Referring MD:  Date of Birth: 1945/11/09 Gender: Female Account #: [id number] Procedure:                Colonoscopy Indications:              Surveillance: Personal history of adenomatous                            polyps on last colonoscopy > 5 years ago Medicines:                Propofol per Anesthesia, Monitored Anesthesia Care Procedure:                Pre-Anesthesia Assessment:                           - Prior to the procedure, a History and Physical                            was performed, and patient medications and                            allergies were reviewed. The patient's tolerance of                            previous anesthesia was also reviewed. The risks                            and benefits of the procedure and the sedation                            options and risks were discussed with the patient.                            All questions were answered, and informed consent                            was obtained. Prior Anticoagulants: The patient has                            taken no previous anticoagulant or antiplatelet                            agents. ASA Grade Assessment: II - A patient with                            mild systemic disease. After reviewing the risks                            and benefits, the patient was deemed in                            satisfactory condition to undergo the procedure.  After obtaining informed consent, the colonoscope                            was passed under direct vision. Throughout the                            procedure, the patient's blood pressure, pulse, and                            oxygen saturations were monitored continuously. The                            Olympus [id number]-[id number] (#[id number]) Colonoscope was                             introduced through the anus and advanced to the the                            cecum, identified by appendiceal orifice and                            ileocecal valve. The colonoscopy was somewhat                            difficult due to restricted mobility of the colon                            and a tortuous colon. Successful completion of the                            procedure was aided by straightening and shortening                            the scope to obtain bowel loop reduction. The                            patient tolerated the procedure well. The quality                            of the bowel preparation was good. The ileocecal                            valve, appendiceal orifice, and rectum were                            photographed. The bowel preparation used was                            Miralax via split dose instruction. Scope In: 2:38:43 PM Scope Out: 2:53:32 PM Scope Withdrawal Time: 0 hours 6 minutes 37 seconds  Total Procedure Duration: 0 hours 14 minutes 49 seconds  Findings:                 The perianal and digital rectal examinations were  normal.                           Many small and large-mouthed diverticula were found                            in the sigmoid colon. There was narrowing of the                            colon in association with the diverticular opening.                           Multiple small and large-mouthed diverticula were                            found in the descending colon, transverse colon and                            ascending colon.                           The exam was otherwise without abnormality on                            direct and retroflexion views. Complications:            No immediate complications. Estimated Blood Loss:     Estimated blood loss: none. Impression:               - Severe diverticulosis in the sigmoid colon. There                            was narrowing of  the colon in association with the                            diverticular opening.                           - Moderate diverticulosis in the descending colon,                            in the transverse colon and in the ascending colon.                           - The examination was otherwise normal on direct                            and retroflexion views.                           - No specimens collected.                           - Personal history of colonic polyp 10 mm adenoma  2015. Recommendation:           - Patient has a contact number available for                            emergencies. The signs and symptoms of potential                            delayed complications were discussed with the                            patient. Return to normal activities tomorrow.                            Written discharge instructions were provided to the                            patient.                           - Resume previous diet.                           - Continue present medications.                           - No repeat colonoscopy due to current age (43                            years or older) and the absence of colonic polyps. Gatha Mayer, MD 02/08/2021 3:02:38 PM This report has been signed electronically.

## 2021-02-10 ENCOUNTER — Telehealth: Payer: Self-pay | Admitting: *Deleted

## 2021-02-10 ENCOUNTER — Telehealth: Payer: Self-pay

## 2021-02-10 NOTE — Telephone Encounter (Signed)
NO ANSWER, MESSAGE LEFT FOR PATIENT. 

## 2021-02-10 NOTE — Telephone Encounter (Signed)
  Follow up Call-  Call back number 02/08/2021  Post procedure Call Back phone  # [phone]  Permission to leave phone message Yes  Some recent data might be hidden     Patient questions:  Do you have a fever, pain , or abdominal swelling? No. Pain Score  0 *  Have you tolerated food without any problems? Yes.    Have you been able to return to your normal activities? Yes.    Do you have any questions about your discharge instructions: Diet   No. Medications  No. Follow up visit  No.  Do you have questions or concerns about your Care? No.  Actions: * If pain score is 4 or above: No action needed, pain <4.  1. Have you developed a fever since your procedure? no  2.   Have you had an respiratory symptoms (SOB or cough) since your procedure? no  3.   Have you tested positive for COVID 19 since your procedure no  4.   Have you had any family members/close contacts diagnosed with the COVID 19 since your procedure?  no   If yes to any of these questions please route to Joylene John, RN and Joella Prince, RN

## 2021-02-21 ENCOUNTER — Other Ambulatory Visit: Payer: Self-pay | Admitting: Family

## 2021-02-22 ENCOUNTER — Other Ambulatory Visit: Payer: Self-pay | Admitting: Family

## 2021-04-04 ENCOUNTER — Telehealth: Payer: Self-pay | Admitting: Internal Medicine

## 2021-04-04 NOTE — Telephone Encounter (Signed)
Randal called me back. This LLQ pain started 2 to 3 days ago, no fever, normal bowel movements, no rectal bleeding. BRAT diet helping her. Requesting antibiotics . Please advise Sir.

## 2021-04-04 NOTE — Telephone Encounter (Signed)
Inbound call from patient stating she is have a diverticulitis flare up and is wanting to know if an antibiotic can be sent to pharmacy in chart for it.  Please advise.

## 2021-04-04 NOTE — Telephone Encounter (Signed)
I informed Mia Newman and she will continue her BRAT diet and hopefully call her PCP in the AM. She said she is taking tylenol and it is helping.

## 2021-04-04 NOTE — Telephone Encounter (Signed)
I left voicemail messages on both cell and home #'s . I need to know what symptoms she is having and when did flare start.

## 2021-04-04 NOTE — Telephone Encounter (Signed)
I reviewed her chart and she has had 2 CT scans in the last year that do not show diverticulitis but do show diverticulosis.  Given that information I am not going to prescribe antibiotics  I would recommend she try to be seen by primary care or an urgent care to have them evaluate her and to consider whether or not antibiotics makes sense  Otherwise she should continue what she is doing as far as a diet

## 2021-04-05 ENCOUNTER — Encounter: Payer: Self-pay | Admitting: Family

## 2021-04-05 ENCOUNTER — Other Ambulatory Visit: Payer: Self-pay

## 2021-04-05 ENCOUNTER — Ambulatory Visit (INDEPENDENT_AMBULATORY_CARE_PROVIDER_SITE_OTHER): Payer: Medicare Other | Admitting: Family

## 2021-04-05 VITALS — BP 132/70 | HR 62 | Temp 97.9°F | Ht 62.0 in | Wt 126.8 lb

## 2021-04-05 DIAGNOSIS — E039 Hypothyroidism, unspecified: Secondary | ICD-10-CM

## 2021-04-05 DIAGNOSIS — R1032 Left lower quadrant pain: Secondary | ICD-10-CM

## 2021-04-05 MED ORDER — CIPROFLOXACIN HCL 500 MG PO TABS: 500.0000 mg | ORAL_TABLET | Freq: Two times a day (BID) | ORAL | 0 refills | Status: AC

## 2021-04-05 MED ORDER — METRONIDAZOLE 500 MG PO TABS: 500.0000 mg | ORAL_TABLET | Freq: Three times a day (TID) | ORAL | 0 refills | Status: AC

## 2021-04-05 NOTE — Progress Notes (Signed)
Mia Newman is a 75 y.o. female with the following history as recorded in EpicCare:  Patient Active Problem List   Diagnosis Date Noted   Periumbilical abdominal tenderness without rebound tenderness 07/24/2019   Diverticulitis of large intestine without perforation or abscess without bleeding 07/24/2019   Hyperglycemia 08/07/2018   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   Left rotator cuff tear 06/19/2017   Vaginitis and vulvovaginitis 06/09/2014   Meningioma (HCC) 09/02/2013   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   Wellness examination 01/04/2012   Asymptomatic postmenopausal status 03/03/2010   PURE HYPERCHOLESTEROLEMIA 09/27/2007    Current Outpatient Medications  Medication Sig Dispense Refill   acetaminophen (TYLENOL) 500 MG tablet Take 1,000 mg by mouth every 6 (six) hours as needed for moderate pain.     Ascorbic Acid (VITAMIN C) 1000 MG tablet Take 1,000 mg by mouth daily.     ciprofloxacin (CIPRO) 500 MG tablet Take 1 tablet (500 mg total) by mouth 2 (two) times daily for 7 days. 14 tablet 0   latanoprost (XALATAN) 0.005 % ophthalmic solution Place 1 drop into both eyes at bedtime.     levothyroxine (EUTHYROX) 50 MCG tablet Take 1 tablet (50 mcg total) by mouth daily before breakfast. 90 tablet 0   meloxicam (MOBIC) 15 MG tablet Take 1 tablet (15 mg total) by mouth daily. 30 tablet 0   metroNIDAZOLE (FLAGYL) 500 MG tablet Take 1 tablet (500 mg total) by mouth 3 (three) times daily for 7 days. 21 tablet 0   methocarbamol (ROBAXIN) 500 MG tablet TAKE 1 TABLET BY MOUTH AT BEDTIME AS NEEDED (Patient not taking: Reported on 04/05/2021) 30 tablet 0   No current facility-administered medications for this visit.    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 ([facility])   BREAST SURGERY     COLONOSCOPY     TUBAL LIGATION     VSD REPAIR  1996    Family History  Problem Relation Age of Onset   Heart attack Father 58   Hypertension Mother    Cancer Maternal Grandmother        Uterine Cancer   Stomach cancer Paternal Grandmother 85   Colon cancer Neg Hx    Esophageal cancer Neg Hx     Social History   Tobacco Use   Smoking status: Never   Smokeless tobacco: Never  Substance Use Topics   Alcohol use: No    Subjective:   LLQ pain x 4 days; known history of  diverticulosis; most recent CT in 12/2020 did not show diverticulitis when patient had same symptoms bur did show diverticulosis; had colonoscopy in 01/2021 which did show moderate- severe diverticulosis changes;    Objective:  Vitals:   04/05/21 1318  BP: 132/70  Pulse: 62  Temp: 97.9 F (36.6 C)  TempSrc: Oral  SpO2: 99%  Weight: 126 lb 12.8 oz (57.5 kg)  Height: 5' 2" (1.575 m)      General: Well developed, well nourished, in no acute distress  Skin : Warm and dry.  Head: Normocephalic and atraumatic  Eyes: Sclera and conjunctiva clear; pupils round and reactive to light; extraocular movements intact  Ears: External normal; canals clear; tympanic membranes normal  Oropharynx: Pink, supple. No suspicious lesions  Neck: Supple without thyromegaly, adenopathy  Lungs: Respirations unlabored;  Abdomen: Soft; nontender; nondistended; normoactive bowel sounds; no masses or hepatosplenomegaly  Neurologic: Alert and oriented; speech intact; face symmetrical; moves all extremities well; CNII-XII intact  without focal deficit   Assessment:  1. LLQ pain   2. Hypothyroidism, unspecified type     Plan:  Suspected diverticulitis- known to have extensive diverticulosis on most recent colonoscopy; discussed that may have to have surgical consult if frequency of infections persist; will use today's discussion as 1st episode and watch frequency; Check TSH today; follow up to be determined;  This visit occurred during the SARS-CoV-2 public health emergency.  Safety protocols were in place, including screening questions prior to the visit, additional usage of staff PPE, and extensive cleaning of exam room while observing appropriate contact time as indicated for disinfecting solutions.    No follow-ups on file.  Orders Placed This Encounter  Procedures   CBC with Differential/Platelet   Comp Met (CMET)   TSH    Requested Prescriptions   Signed Prescriptions Disp Refills   metroNIDAZOLE (FLAGYL) 500 MG tablet 21 tablet 0    Sig: Take 1 tablet (500 mg total) by mouth 3 (three) times daily for 7 days.   ciprofloxacin (CIPRO) 500 MG tablet 14 tablet 0    Sig: Take 1 tablet (500 mg total) by mouth 2 (two) times daily for 7 days.     

## 2021-04-06 LAB — CBC WITH DIFFERENTIAL/PLATELET
Basophils Absolute: 0 10*3/uL (ref 0.0–0.1)
Basophils Relative: 0.7 % (ref 0.0–3.0)
Eosinophils Absolute: 0.2 10*3/uL (ref 0.0–0.7)
Eosinophils Relative: 3.4 % (ref 0.0–5.0)
HCT: 40.9 % (ref 36.0–46.0)
Hemoglobin: 13.8 g/dL (ref 12.0–15.0)
Lymphocytes Relative: 27.1 % (ref 12.0–46.0)
Lymphs Abs: 1.7 10*3/uL (ref 0.7–4.0)
MCHC: 33.9 g/dL (ref 30.0–36.0)
MCV: 92 fl (ref 78.0–100.0)
Monocytes Absolute: 0.5 10*3/uL (ref 0.1–1.0)
Monocytes Relative: 7.8 % (ref 3.0–12.0)
Neutro Abs: 3.9 10*3/uL (ref 1.4–7.7)
Neutrophils Relative %: 61 % (ref 43.0–77.0)
Platelets: 291 10*3/uL (ref 150.0–400.0)
RBC: 4.44 Mil/uL (ref 3.87–5.11)
RDW: 14.9 % (ref 11.5–15.5)
WBC: 6.4 10*3/uL (ref 4.0–10.5)

## 2021-04-06 LAB — COMPREHENSIVE METABOLIC PANEL
ALT: 10 U/L (ref 0–35)
AST: 9 U/L (ref 0–37)
Albumin: 4 g/dL (ref 3.5–5.2)
Alkaline Phosphatase: 56 U/L (ref 39–117)
BUN: 9 mg/dL (ref 6–23)
CO2: 31 mEq/L (ref 19–32)
Calcium: 9 mg/dL (ref 8.4–10.5)
Chloride: 104 mEq/L (ref 96–112)
Creatinine, Ser: 0.72 mg/dL (ref 0.40–1.20)
GFR: 82.44 mL/min (ref >60.00)
Glucose, Bld: 85 mg/dL (ref 70–99)
Potassium: 4.3 mEq/L (ref 3.5–5.1)
Sodium: 142 mEq/L (ref 135–145)
Total Bilirubin: 0.4 mg/dL (ref 0.2–1.2)
Total Protein: 6.4 g/dL (ref 6.0–8.3)

## 2021-04-06 LAB — TSH: TSH: 1.85 u[IU]/mL (ref 0.35–4.50)

## 2021-04-08 ENCOUNTER — Other Ambulatory Visit: Payer: Self-pay | Admitting: Family

## 2021-04-08 MED ORDER — LEVOTHYROXINE SODIUM 50 MCG PO TABS: 50.0000 ug | ORAL_TABLET | Freq: Every day | ORAL | 3 refills | Status: DC

## 2021-04-20 ENCOUNTER — Telehealth: Payer: Self-pay | Admitting: Family

## 2021-04-20 NOTE — Chronic Care Management (AMB) (Signed)
  Chronic Care Management   Note  04/20/2021 Name: [name] MRN: [id number] DOB: December 17, 1945  Mia Newman is a 75 y.o. year old female who is a primary care patient of Marrian Salvage, [place]. I reached out to [place] by phone today in response to a referral sent by Ms. Mia Newman's PCP, Marrian Salvage, [place].   Mia Newman was given information about Chronic Care Management services today including:  CCM service includes personalized support from designated clinical staff supervised by her physician, including individualized plan of care and coordination with other care providers 24/7 contact phone numbers for assistance for urgent and routine care needs. Service will only be billed when office clinical staff spend 20 minutes or more in a month to coordinate care. Only one practitioner may furnish and bill the service in a calendar month. The patient may stop CCM services at any time (effective at the end of the month) by phone call to the office staff.   Patient agreed to services and verbal consent obtained.   Follow up plan:   Lauretta Grill Upstream Scheduler

## 2021-05-19 ENCOUNTER — Telehealth: Payer: Self-pay

## 2021-05-19 NOTE — Chronic Care Management (AMB) (Signed)
    Chronic Care Management Pharmacy Assistant   Name: DANAYSIA SCAVONE  MRN: [id number] DOB: 05/08/1946  Mia Newman is an 75 y.o. year old female who presents for her initial CCM visit with the clinical pharmacist.  Reason for Encounter: Initial CCM Visit  Recent office visits:  04/05/21- Jodi Mourning, FNP- seen for LLQ pain, short course ciprofloxacin 500 mg x 7 days, short course metronidazole 500 mg x 7 days,labs ordered, no follow up documented  01/19/21- Orders only- decreased levothyroxine from 75 mcg to 50 mcg daily  01/18/21- Jodi Mourning, FNP-  seen for acute low back pain without sciatica, unspecified back pain laterality, started meloxicam 15 mg daily, started methocarbamol 500 mg at bedtime prn, labs ordered, follow up as needed 12/10/20- Jodi Mourning, FNP (Video Visit)- seen for LLQ pain, x- ray ordered, no medication changes, no follow up documented  12/02/20- Jaynee Eagles, PA-C (Urgent Care)- seen for persistent intermittent lower abdominal pain, no medication changes, no follow up documented  Recent consult visits:  02/08/21- Colonoscopy Procedure - Silvano Rusk, MD (Gastroenterology)  Hospital visits:  None in previous 6 months  Medications: Outpatient Encounter Medications as of 05/19/2021  Medication Sig   acetaminophen (TYLENOL) 500 MG tablet Take 1,000 mg by mouth every 6 (six) hours as needed for moderate pain.   Ascorbic Acid (VITAMIN C) 1000 MG tablet Take 1,000 mg by mouth daily.   latanoprost (XALATAN) 0.005 % ophthalmic solution Place 1 drop into both eyes at bedtime.   levothyroxine (EUTHYROX) 50 MCG tablet Take 1 tablet (50 mcg total) by mouth daily before breakfast.   meloxicam (MOBIC) 15 MG tablet Take 1 tablet (15 mg total) by mouth daily.   methocarbamol (ROBAXIN) 500 MG tablet TAKE 1 TABLET BY MOUTH AT BEDTIME AS NEEDED (Patient not taking: Reported on 04/05/2021)   [DISCONTINUED] fluticasone (FLONASE) 50 MCG/ACT nasal spray Place 2 sprays into  both nostrils daily. (Patient not taking: Reported on 12/20/2019)   No facility-administered encounter medications on file as of 05/19/2021.   Current Documented Medications acetaminophen 500 MG tablet Ascorbic Acid 1000 MG tablet latanoprost 0.005 % ophthalmic solution- 56 DS last filled 04/15/21 levothyroxine  50 MCG -90 DS last filled 04/09/21 meloxicam  15 MG- 30 DS last filled 01/18/21 methocarbamol  500 MG- 30 DS last filled 02/23/21  Wilford Sports CPA, CMA

## 2021-05-23 ENCOUNTER — Other Ambulatory Visit: Payer: Self-pay | Admitting: Family

## 2021-05-25 ENCOUNTER — Telehealth: Payer: Medicare Other

## 2021-05-26 ENCOUNTER — Telehealth: Payer: Self-pay | Admitting: Family

## 2021-05-26 NOTE — Telephone Encounter (Signed)
LVM for pt to rtn my call to schedule AWV with [facility]. Please schedule this appt if pt calls the office.  °

## 2021-06-16 DIAGNOSIS — H4720 Unspecified optic atrophy: Secondary | ICD-10-CM | POA: Diagnosis not present

## 2021-06-16 DIAGNOSIS — H2513 Age-related nuclear cataract, bilateral: Secondary | ICD-10-CM | POA: Diagnosis not present

## 2021-06-16 DIAGNOSIS — H401233 Low-tension glaucoma, bilateral, severe stage: Secondary | ICD-10-CM | POA: Diagnosis not present

## 2021-08-20 DIAGNOSIS — Z23 Encounter for immunization: Secondary | ICD-10-CM | POA: Diagnosis not present

## 2021-09-23 ENCOUNTER — Telehealth: Payer: Self-pay | Admitting: Family

## 2021-09-23 NOTE — Telephone Encounter (Signed)
[facility] is calling because the manufacturer for levothyroxine (EUTHYROX) 50 MCG tablet is changing and they need authorization to give the patient the new one. The manufacturer is changing to [employer], but the medication will be the same. They can be reached to [phone]. Please advice.

## 2021-09-23 NOTE — Telephone Encounter (Signed)
I have called the pharmacy and gave the verbal okay that they can have the other manufacture of the medication.

## 2021-10-04 DIAGNOSIS — H401232 Low-tension glaucoma, bilateral, moderate stage: Secondary | ICD-10-CM | POA: Diagnosis not present

## 2021-10-04 DIAGNOSIS — H2513 Age-related nuclear cataract, bilateral: Secondary | ICD-10-CM | POA: Diagnosis not present

## 2021-10-04 DIAGNOSIS — H5340 Unspecified visual field defects: Secondary | ICD-10-CM | POA: Diagnosis not present

## 2021-10-14 DIAGNOSIS — H01114 Allergic dermatitis of left upper eyelid: Secondary | ICD-10-CM | POA: Diagnosis not present

## 2021-10-14 DIAGNOSIS — H01111 Allergic dermatitis of right upper eyelid: Secondary | ICD-10-CM | POA: Diagnosis not present

## 2021-10-14 DIAGNOSIS — H01112 Allergic dermatitis of right lower eyelid: Secondary | ICD-10-CM | POA: Diagnosis not present

## 2021-10-14 DIAGNOSIS — H10413 Chronic giant papillary conjunctivitis, bilateral: Secondary | ICD-10-CM | POA: Diagnosis not present

## 2021-10-20 DIAGNOSIS — H01111 Allergic dermatitis of right upper eyelid: Secondary | ICD-10-CM | POA: Diagnosis not present

## 2021-10-20 DIAGNOSIS — H01115 Allergic dermatitis of left lower eyelid: Secondary | ICD-10-CM | POA: Diagnosis not present

## 2021-10-20 DIAGNOSIS — H01114 Allergic dermatitis of left upper eyelid: Secondary | ICD-10-CM | POA: Diagnosis not present

## 2021-10-20 DIAGNOSIS — H01112 Allergic dermatitis of right lower eyelid: Secondary | ICD-10-CM | POA: Diagnosis not present

## 2021-12-13 ENCOUNTER — Other Ambulatory Visit: Payer: Self-pay | Admitting: Family

## 2021-12-23 ENCOUNTER — Ambulatory Visit (INDEPENDENT_AMBULATORY_CARE_PROVIDER_SITE_OTHER): Payer: Medicare (Managed Care) | Admitting: Family

## 2021-12-23 ENCOUNTER — Encounter: Payer: Self-pay | Admitting: Family

## 2021-12-23 VITALS — BP 140/60 | HR 64 | Temp 98.3°F | Ht 61.0 in | Wt 126.8 lb

## 2021-12-23 DIAGNOSIS — E890 Postprocedural hypothyroidism: Secondary | ICD-10-CM

## 2021-12-23 DIAGNOSIS — E2839 Other primary ovarian failure: Secondary | ICD-10-CM | POA: Diagnosis not present

## 2021-12-23 DIAGNOSIS — Z13820 Encounter for screening for osteoporosis: Secondary | ICD-10-CM

## 2021-12-23 DIAGNOSIS — Z1231 Encounter for screening mammogram for malignant neoplasm of breast: Secondary | ICD-10-CM

## 2021-12-23 DIAGNOSIS — Z0000 Encounter for general adult medical examination without abnormal findings: Secondary | ICD-10-CM | POA: Diagnosis not present

## 2021-12-23 DIAGNOSIS — E559 Vitamin D deficiency, unspecified: Secondary | ICD-10-CM

## 2021-12-23 DIAGNOSIS — Z13220 Encounter for screening for lipoid disorders: Secondary | ICD-10-CM

## 2021-12-23 MED ORDER — METHOCARBAMOL 500 MG PO TABS: 500.0000 mg | ORAL_TABLET | Freq: Every evening | ORAL | 3 refills | Status: DC | PRN

## 2021-12-23 NOTE — Progress Notes (Signed)
?Mia Newman is a 76 y.o. female with the following history as recorded in EpicCare:  ?Patient Active Problem List  ? Diagnosis Date Noted  ? Periumbilical abdominal tenderness without rebound tenderness 07/24/2019  ? Diverticulitis of large intestine without perforation or abscess without bleeding 07/24/2019  ? Hyperglycemia 08/07/2018  ? 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  ? Left rotator cuff tear 06/19/2017  ? Vaginitis and vulvovaginitis 06/09/2014  ? Meningioma ([place]) 09/02/2013  ? 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 Wellness examination 01/04/2012  ? Asymptomatic postmenopausal status 03/03/2010  ? PURE HYPERCHOLESTEROLEMIA 09/27/2007  ?  ?Current Outpatient Medications  ?Medication Sig Dispense Refill  ? acetaminophen (TYLENOL) 500 MG tablet Take 1,000 mg by mouth every 6 (six) hours as needed for moderate pain.    ? Ascorbic Acid (VITAMIN C) 1000 MG tablet Take 1,000 mg by mouth daily.    ? latanoprost (XALATAN) 0.005 % ophthalmic solution Place 1 drop into both eyes at bedtime.    ? levothyroxine (EUTHYROX) 50 MCG tablet Take 1 tablet (50 mcg total) by mouth daily before breakfast. 90 tablet 3  ? methocarbamol (ROBAXIN) 500 MG tablet Take 1 tablet (500 mg total) by mouth at bedtime as needed. 30 tablet 3  ? ?No current facility-administered medications for this visit.  ?  ?Allergies: Atorvastatin, Raloxifene, Simvastatin, and Sulfamethoxazole  ?Past Medical History:  ?Diagnosis Date  ? Allergy   ? Arthritis   ? ASYMPTOMATIC POSTMENOPAUSAL STATUS 03/03/2010  ? DIVERTICULITIS-COLON 05/22/2008  ? DIVERTICULOSIS, COLON 05/20/2008  ? DM 04/22/2008  ? borderline  ? Glaucoma   ? Hyperthyroidism   ? Irritable bowel syndrome   ? Personal history of colonic polyp - adenoma 03/03/2014  ? Pure hypercholesterolemia 09/27/2007  ? Unspecified hypothyroidism 04/22/2008  ?  ?Past Surgical History:  ?Procedure Laterality Date  ? ABDOMINAL HYSTERECTOMY  1987 apx  ? 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)  ? BREAST SURGERY    ? COLONOSCOPY    ? TUBAL LIGATION    ? VSD REPAIR  1996  ?  ?Family History  ?Problem Relation Age of Onset  ? Heart attack Father 65  ? Hypertension Mother   ? Cancer Maternal Grandmother   ?     Uterine Cancer  ? Stomach cancer Paternal Grandmother 38  ? Colon cancer Neg Hx   ? Esophageal cancer Neg Hx   ?  ?Social History  ? ?Tobacco Use  ? Smoking status: Never  ? Smokeless tobacco: Never  ?Substance Use Topics  ? Alcohol use: No  ?  ?Subjective:  ? ?Presents for yearly CPE; no acute concerns today;  ?Overdue for mammogram and DEXA- agrees to schedule;  ?Up to date on colonoscopy- no recent issues with diverticulitis;  ?Does see ophthalmology for management of glaucoma;  ? ?Review of Systems  ?Constitutional: Negative.   ?HENT: Negative.    ?Eyes: Negative.   ?Respiratory: Negative.    ?Cardiovascular: Negative.   ?Gastrointestinal: Negative.   ?Genitourinary: Negative.   ?Musculoskeletal: Negative.   ?Skin: Negative.   ?Neurological: Negative.   ?Endo/Heme/Allergies: Negative.   ?Psychiatric/Behavioral: Negative.    ? ? ? ? ? ?Objective:  ?Vitals:  ? 12/23/21 1455  ?BP: 140/60  ?Pulse: 64  ?Temp: 98.3 ?F (36.8 ?C)  ?TempSrc: Oral  ?SpO2: 99%  ?Weight: 126 lb 12.8 oz (57.5 kg)  ?Height: 5' 1" (1.549 m)  ?  ?General: Well developed, well nourished, in no acute distress  ?Skin : Warm and  dry.  ?Head: Normocephalic and atraumatic  ?Eyes: Sclera and conjunctiva clear; pupils round and reactive to light; extraocular movements intact  ?Ears: External normal; canals clear; tympanic membranes normal  ?Oropharynx: Pink, supple. No suspicious lesions  ?Neck: Supple without thyromegaly, adenopathy  ?Lungs: Respirations unlabored; clear to auscultation bilaterally without wheeze, rales, rhonchi  ?CVS exam: normal rate and regular rhythm.  ?Abdomen: Soft; nontender; nondistended; normoactive bowel sounds; no masses or hepatosplenomegaly  ?Musculoskeletal: No  deformities; no active joint inflammation  ?Extremities: No edema, cyanosis, clubbing  ?Vessels: Symmetric bilaterally  ?Neurologic: Alert and oriented; speech intact; face symmetrical; moves all extremities well; CNII-XII intact without focal deficit  ? ?Assessment:  ?1. PE (physical exam), annual   ?2. Visit for screening mammogram   ?3. Ovarian failure   ?4. Osteoporosis screening   ?5. Hypothyroidism following radioiodine therapy   ?6. Lipid screening   ?7. Vitamin D deficiency   ?  ?Plan:  ? ?Age appropriate preventive healthcare needs addressed; encouraged regular eye doctor and dental exams; encouraged regular exercise; will update labs and refills as needed today; follow-up to be determined; ?Start checking blood pressure regularly and follow up if consistently above 140/ 90;  ?Ear lavage was completed in office with no complications; ?Order for mammogram and DEXA updated;  ? ?This visit occurred during the SARS-CoV-2 public health emergency.  Safety protocols were in place, including screening questions prior to the visit, additional usage of staff PPE, and extensive cleaning of exam room while observing appropriate contact time as indicated for disinfecting solutions.  ? ? ?No follow-ups on file.  ?Orders Placed This Encounter  ?Procedures  ? MM Digital Screening  ?  Standing Status:   Future  ?  Standing Expiration Date:   12/24/2022  ?  Scheduling Instructions:  ?   Please schedule with DEXA  ?  Order Specific Question:   Reason for Exam (SYMPTOM  OR DIAGNOSIS REQUIRED)  ?  Answer:   screening mammogram  ?  Order Specific Question:   Preferred imaging location?  ?  Answer:   [facility]  ? DG Bone Density  ?  Standing Status:   Future  ?  Standing Expiration Date:   12/24/2022  ?  Scheduling Instructions:  ?   Schedule with mammogram  ?  Order Specific Question:   Reason for Exam (SYMPTOM  OR DIAGNOSIS REQUIRED)  ?  Answer:   ovarian failure/ osteoporosis screening  ?  Order Specific Question:    Preferred imaging location?  ?  Answer:   [facility]  ? CBC with Differential/Platelet  ? Comp Met (CMET)  ? Lipid panel  ? TSH  ? Vitamin D (25 hydroxy)  ?  ?Requested Prescriptions  ? ?Signed Prescriptions Disp Refills  ? methocarbamol (ROBAXIN) 500 MG tablet 30 tablet 3  ?  Sig: Take 1 tablet (500 mg total) by mouth at bedtime as needed.  ?  ? ?

## 2021-12-23 NOTE — Patient Instructions (Signed)
Please start checking your blood pressure regularly and follow up if your readings are consistently above 140/ 90;  ? ?Please get your mammogram and bone density- you will be called to get these scheduled.  ?

## 2021-12-24 LAB — COMPREHENSIVE METABOLIC PANEL
AG Ratio: 1.8 (calc) (ref 1.0–2.5)
ALT: 12 U/L (ref 6–29)
AST: 13 U/L (ref 10–35)
Albumin: 4.4 g/dL (ref 3.6–5.1)
Alkaline phosphatase (APISO): 51 U/L (ref 37–153)
BUN: 10 mg/dL (ref 7–25)
CO2: 28 mmol/L (ref 20–32)
Calcium: 9.5 mg/dL (ref 8.6–10.4)
Chloride: 103 mmol/L (ref 98–110)
Creat: 0.7 mg/dL (ref 0.60–1.00)
Globulin: 2.5 g/dL (calc) (ref 1.9–3.7)
Glucose, Bld: 93 mg/dL (ref 65–99)
Potassium: 4.2 mmol/L (ref 3.5–5.3)
Sodium: 141 mmol/L (ref 135–146)
Total Bilirubin: 0.4 mg/dL (ref 0.2–1.2)
Total Protein: 6.9 g/dL (ref 6.1–8.1)

## 2021-12-24 LAB — LIPID PANEL
Cholesterol: 304 mg/dL — ABNORMAL HIGH (ref <200)
HDL: 50 mg/dL (ref >=50)
LDL Cholesterol (Calc): 213 mg/dL (calc) — ABNORMAL HIGH
Non-HDL Cholesterol (Calc): 254 mg/dL (calc) — ABNORMAL HIGH (ref <130)
Total CHOL/HDL Ratio: 6.1 (calc) — ABNORMAL HIGH (ref <5.0)
Triglycerides: 218 mg/dL — ABNORMAL HIGH (ref <150)

## 2021-12-24 LAB — CBC WITH DIFFERENTIAL/PLATELET
Absolute Monocytes: 591 cells/uL (ref 200–950)
Basophils Absolute: 51 cells/uL (ref 0–200)
Basophils Relative: 0.7 %
Eosinophils Absolute: 190 cells/uL (ref 15–500)
Eosinophils Relative: 2.6 %
HCT: 45.8 % — ABNORMAL HIGH (ref 35.0–45.0)
Hemoglobin: 15 g/dL (ref 11.7–15.5)
Lymphs Abs: 2205 cells/uL (ref 850–3900)
MCH: 30.3 pg (ref 27.0–33.0)
MCHC: 32.8 g/dL (ref 32.0–36.0)
MCV: 92.5 fL (ref 80.0–100.0)
MPV: 10.7 fL (ref 7.5–12.5)
Monocytes Relative: 8.1 %
Neutro Abs: 4263 cells/uL (ref 1500–7800)
Neutrophils Relative %: 58.4 %
Platelets: 284 10*3/uL (ref 140–400)
RBC: 4.95 10*6/uL (ref 3.80–5.10)
RDW: 13.6 % (ref 11.0–15.0)
Total Lymphocyte: 30.2 %
WBC: 7.3 10*3/uL (ref 3.8–10.8)

## 2021-12-24 LAB — VITAMIN D 25 HYDROXY (VIT D DEFICIENCY, FRACTURES): Vit D, 25-Hydroxy: 22 ng/mL — ABNORMAL LOW (ref 30–100)

## 2021-12-24 LAB — TSH: TSH: 8.75 mIU/L — ABNORMAL HIGH (ref 0.40–4.50)

## 2021-12-26 ENCOUNTER — Other Ambulatory Visit: Payer: Self-pay | Admitting: Family

## 2021-12-26 DIAGNOSIS — E039 Hypothyroidism, unspecified: Secondary | ICD-10-CM

## 2021-12-26 MED ORDER — LEVOTHYROXINE SODIUM 75 MCG PO TABS: 75.0000 ug | ORAL_TABLET | Freq: Every day | ORAL | 0 refills | Status: DC

## 2021-12-28 ENCOUNTER — Telehealth: Payer: Self-pay | Admitting: *Deleted

## 2021-12-28 NOTE — Telephone Encounter (Signed)
Advised patient about lab results and she stated that she would be willing to try another medication for cholesterol. ? ?Patient set up to recheck lab work on 02/06/22. ?

## 2021-12-28 NOTE — Telephone Encounter (Signed)
-----   Message from Marrian Salvage, [place] sent at 12/26/2021  2:44 PM EST ----- ?Her TSH was elevated meaning she is not taking enough medication- I need to increase the dosage of her Synthroid and we need to re-check her labs in about 6-8 weeks. Lab appointment only is okay;  ? ?Her lipids were quite elevated as well. I would recommend that she consider starting a cholesterol medication. I know she hasn't done well on some statins in the past but there are some other ones we could try.  ? ?

## 2021-12-29 ENCOUNTER — Other Ambulatory Visit: Payer: Self-pay | Admitting: Family

## 2021-12-29 DIAGNOSIS — E785 Hyperlipidemia, unspecified: Secondary | ICD-10-CM

## 2021-12-29 MED ORDER — EZETIMIBE 10 MG PO TABS: 10.0000 mg | ORAL_TABLET | Freq: Every day | ORAL | 1 refills | Status: DC

## 2022-01-03 ENCOUNTER — Ambulatory Visit: Payer: Medicare (Managed Care)

## 2022-01-05 ENCOUNTER — Ambulatory Visit
Admission: RE | Admit: 2022-01-05 | Discharge: 2022-01-05 | Disposition: A | Discharge status: Home / Self Care | Payer: Medicare (Managed Care) | Source: Ambulatory Visit | Attending: Family | Admitting: Family

## 2022-01-05 DIAGNOSIS — Z1231 Encounter for screening mammogram for malignant neoplasm of breast: Secondary | ICD-10-CM

## 2022-01-09 ENCOUNTER — Other Ambulatory Visit: Payer: Self-pay | Admitting: Family

## 2022-01-09 DIAGNOSIS — R928 Other abnormal and inconclusive findings on diagnostic imaging of breast: Secondary | ICD-10-CM

## 2022-01-16 ENCOUNTER — Ambulatory Visit: Payer: Medicare (Managed Care) | Admitting: Medical

## 2022-01-16 VITALS — BP 135/85 | HR 66 | Temp 97.7°F | Resp 18 | Ht 61.0 in | Wt 128.4 lb

## 2022-01-16 DIAGNOSIS — J302 Other seasonal allergic rhinitis: Secondary | ICD-10-CM | POA: Diagnosis not present

## 2022-01-16 DIAGNOSIS — R067 Sneezing: Secondary | ICD-10-CM | POA: Diagnosis not present

## 2022-01-16 DIAGNOSIS — J029 Acute pharyngitis, unspecified: Secondary | ICD-10-CM

## 2022-01-16 DIAGNOSIS — R0982 Postnasal drip: Secondary | ICD-10-CM

## 2022-01-16 MED ORDER — LEVOCETIRIZINE DIHYDROCHLORIDE 5 MG PO TABS: 5.0000 mg | ORAL_TABLET | Freq: Every evening | ORAL | 3 refills | Status: DC

## 2022-01-16 MED ORDER — FLUTICASONE PROPIONATE 50 MCG/ACT NA SUSP
2.0000 | Freq: Every day | NASAL | 1 refills | Status: DC
Start: 2022-01-16 — End: 2022-06-30

## 2022-01-16 NOTE — Progress Notes (Signed)
? ?Subjective:  ? ? Patient ID: Mia Newman, female    DOB: Jun 12, 1946, 76 y.o.   MRN: [id number] ? ?HPI ?Pt in for some st for about 10-14 days. Some sneezing, itchy eyes and runny nose. Pt states can feel drainage on back of his throat. ?This time of the year she has allergies. Mild dry cough.  ? ?Pt tried tylenol otc and ibuprofen.  ? ?Pt tried equate allergy med but name of type not given. ? ? ?No fever, no chills or sweats. No body aches. ? ? ?Review of Systems  ?Constitutional:  Negative for chills and fever.  ?HENT:  Positive for congestion, postnasal drip, rhinorrhea, sneezing and sore throat.   ?Respiratory:  Positive for cough. Negative for chest tightness, shortness of breath and wheezing.   ?Cardiovascular:  Negative for chest pain and palpitations.  ?Gastrointestinal:  Negative for abdominal pain and blood in stool.  ?Musculoskeletal:  Negative for back pain, joint swelling and myalgias.  ?Neurological:  Negative for dizziness, syncope, weakness, light-headedness and headaches.  ?Hematological:  Negative for adenopathy. Does not bruise/bleed easily.  ?Psychiatric/Behavioral:  Negative for behavioral problems and confusion.   ? ? ?Past Medical History:  ?Diagnosis Date  ? Allergy   ? Arthritis   ? ASYMPTOMATIC POSTMENOPAUSAL STATUS 03/03/2010  ? DIVERTICULITIS-COLON 05/22/2008  ? DIVERTICULOSIS, COLON 05/20/2008  ? DM 04/22/2008  ? borderline  ? Glaucoma   ? Hyperthyroidism   ? Irritable bowel syndrome   ? Personal history of colonic polyp - adenoma 03/03/2014  ? Pure hypercholesterolemia 09/27/2007  ? Unspecified hypothyroidism 04/22/2008  ? ?  ?Social History  ? ?Socioeconomic History  ? Marital status: Married  ?  Spouse name: Not on file  ? Number of children: Not on file  ? Years of education: Not on file  ? Highest education level: Not on file  ?Occupational History  ? Occupation: Retired  ?Tobacco Use  ? Smoking status: Never  ? Smokeless tobacco: Never  ?Vaping Use  ? Vaping Use: Never used   ?Substance and Sexual Activity  ? Alcohol use: No  ? Drug use: No  ? Sexual activity: Yes  ?  Birth control/protection: None  ?Other Topics Concern  ? Not on file  ?Social History Narrative  ? Not on file  ? ?Social Determinants of Health  ? ?Financial Resource Strain: Not on file  ?Food Insecurity: Not on file  ?Transportation Needs: Not on file  ?Physical Activity: Not on file  ?Stress: Not on file  ?Social Connections: Not on file  ?Intimate Partner Violence: Not on file  ? ? ?Past Surgical History:  ?Procedure Laterality Date  ? ABDOMINAL HYSTERECTOMY  1987 apx  ? BRAIN MENINGIOMA EXCISION    ? 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 BREAST SURGERY    ? COLONOSCOPY    ? TUBAL LIGATION    ? VSD REPAIR  1996  ? ? ?Family History  ?Problem Relation Age of Onset  ? Heart attack Father 25  ? Hypertension Mother   ? Cancer Maternal Grandmother   ?     Uterine Cancer  ? Stomach cancer Paternal Grandmother 6  ? Colon cancer Neg Hx   ? Esophageal cancer Neg Hx   ? ? ?Allergies  ?Allergen Reactions  ? Atorvastatin Other (See Comments)  ?  Myalgias  ? Raloxifene Other (See Comments)  ?  Unknown rxn  ? Simvastatin Other (See Comments)  ?  Myalgias  ? Sulfamethoxazole Other (See Comments)  ?  Unknown rxn  ? ? ?Current Outpatient Medications on File Prior to Visit  ?Medication Sig Dispense Refill  ? acetaminophen (TYLENOL) 500 MG tablet Take 1,000 mg by mouth every 6 (six) hours as needed for moderate pain.    ? Ascorbic Acid (VITAMIN C) 1000 MG tablet Take 1,000 mg by mouth daily.    ? ezetimibe (ZETIA) 10 MG tablet Take 1 tablet (10 mg total) by mouth daily. 90 tablet 1  ? latanoprost (XALATAN) 0.005 % ophthalmic solution Place 1 drop into both eyes at bedtime.    ? levothyroxine (EUTHYROX) 75 MCG tablet Take 1 tablet (75 mcg total) by mouth daily before breakfast. 90 tablet 0  ? methocarbamol (ROBAXIN) 500 MG tablet Take 1 tablet (500 mg total) by mouth at bedtime as needed. 30 tablet 3  ? [DISCONTINUED] fluticasone  (FLONASE) 50 MCG/ACT nasal spray Place 2 sprays into both nostrils daily. (Patient not taking: Reported on 12/20/2019) 16 g 6  ? ?No current facility-administered medications on file prior to visit.  ? ? ?Pulse 66   Temp 97.7 ?F (36.5 ?C)   Resp 18   Ht '5\' 1"'$  (1.549 m)   Wt 128 lb 6.4 oz (58.2 kg)   SpO2 99%   BMI 24.26 kg/m?  ?  ?   ?Objective:  ? Physical Exam ? ?General ?Mental Status- Alert. General Appearance- Not in acute distress.  ? ?Skin ?General: Color- Normal Color. Moisture- Normal Moisture. ? ?Neck ?Carotid Arteries- Normal color. Moisture- Normal Moisture. No carotid bruits. No JVD. ? ?Chest and Lung Exam ?Auscultation: ?Breath Sounds:-Normal. ? ?Cardiovascular ?Auscultation:Rythm- Regular. ?Murmurs & Other Heart Sounds:Auscultation of the heart reveals- No Murmurs. ? ?Abdomen ?Inspection:-Inspeection Normal. ?Palpation/Percussion:Note:No mass. Palpation and Percussion of the abdomen reveal- Non Tender, Non Distended + BS, no rebound or guarding. ? ?Neurologic ?Cranial Nerve exam:- CN III-XII intact(No nystagmus), symmetric smile. ?Strength:- 5/5 equal and symmetric strength both upper and lower extremities.  ? ?Heent- much pnd present on exam. Mild boggy turbinates. Pharynx not red. No dc seen. No lymphadnopathy ?   ?Assessment & Plan:  ? ?Patient Instructions  ?Probable allergie rhinitis causing mild st over past 10-14 days. Accompanying various allergy symptoms and a lot of PND. ? ?Rx xyzal anthistamine and flonase nasal spray. ? ?If symptoms persist or worsen consider adding on antibiotic but by exam and duration of illness I don't think infectious cause. ? ?Follow up in 7 days or sooner if needed  ? ?Mackie Pai, PA-C  ?

## 2022-01-16 NOTE — Patient Instructions (Signed)
Probable allergie rhinitis causing mild st over past 10-14 days. Accompanying various allergy symptoms and a lot of PND. ? ?Rx xyzal anthistamine and flonase nasal spray. ? ?If symptoms persist or worsen consider adding on antibiotic but by exam and duration of illness I don't think infectious cause. ? ?Follow up in 7 days or sooner if needed ?

## 2022-01-17 ENCOUNTER — Encounter: Payer: Self-pay | Admitting: Medical

## 2022-01-17 ENCOUNTER — Telehealth: Payer: Self-pay

## 2022-01-17 NOTE — Telephone Encounter (Signed)
Pt wanted you to know her bp was 135/85 ?

## 2022-01-23 ENCOUNTER — Ambulatory Visit: Admission: RE | Admit: 2022-01-23 | Payer: Medicare (Managed Care) | Source: Ambulatory Visit

## 2022-01-23 ENCOUNTER — Ambulatory Visit
Admission: RE | Admit: 2022-01-23 | Discharge: 2022-01-23 | Disposition: A | Discharge status: Home / Self Care | Payer: Medicare (Managed Care) | Source: Ambulatory Visit | Attending: Family | Admitting: Family

## 2022-01-23 DIAGNOSIS — R922 Inconclusive mammogram: Secondary | ICD-10-CM | POA: Diagnosis not present

## 2022-01-23 DIAGNOSIS — R928 Other abnormal and inconclusive findings on diagnostic imaging of breast: Secondary | ICD-10-CM

## 2022-01-26 DIAGNOSIS — H10413 Chronic giant papillary conjunctivitis, bilateral: Secondary | ICD-10-CM | POA: Diagnosis not present

## 2022-01-26 DIAGNOSIS — H401232 Low-tension glaucoma, bilateral, moderate stage: Secondary | ICD-10-CM | POA: Diagnosis not present

## 2022-01-26 DIAGNOSIS — H2513 Age-related nuclear cataract, bilateral: Secondary | ICD-10-CM | POA: Diagnosis not present

## 2022-01-26 DIAGNOSIS — H4720 Unspecified optic atrophy: Secondary | ICD-10-CM | POA: Diagnosis not present

## 2022-01-31 ENCOUNTER — Telehealth: Payer: Self-pay | Admitting: Medical

## 2022-01-31 NOTE — Telephone Encounter (Signed)
Pt stated she was prescribed medication to help with allergies and sore throat by Percell Miller 3/27. She felt better, but since she has run out of those medications her sxs have come back. She would like to know if there is anything that can be done for her. Please advise.  ?

## 2022-02-01 ENCOUNTER — Telehealth: Payer: Self-pay | Admitting: Family

## 2022-02-01 NOTE — Telephone Encounter (Signed)
Pt called and lvm to return call 

## 2022-02-01 NOTE — Telephone Encounter (Signed)
I attempted to leave message for patient to call back and schedule Medicare Annual Wellness Visit (AWV).  ? ?Please offer to do virtually or by telephone.  Left office number and my jabber [phone]. ? ?Last AWV:08/07/2018 ? ?Please schedule at anytime with Nurse Health Advisor. ?  ?

## 2022-02-01 NOTE — Telephone Encounter (Signed)
Pt called back and notified.

## 2022-02-03 ENCOUNTER — Ambulatory Visit (INDEPENDENT_AMBULATORY_CARE_PROVIDER_SITE_OTHER): Payer: Medicare (Managed Care)

## 2022-02-03 DIAGNOSIS — Z0000 Encounter for general adult medical examination without abnormal findings: Secondary | ICD-10-CM

## 2022-02-03 NOTE — Patient Instructions (Signed)
Mia Newman , ?Thank you for taking time to come for your Medicare Wellness Visit. I appreciate your ongoing commitment to your health goals. Please review the following plan we discussed and let me know if I can assist you in the future.  ? ?Screening recommendations/referrals: ?Colonoscopy: 02/08/21 due 02/08/26 ?Mammogram: 01/23/22 ?Bone Density: scheduled 06/09/22 ?Recommended yearly ophthalmology/optometry visit for glaucoma screening and checkup ?Recommended yearly dental visit for hygiene and checkup ? ?Vaccinations: ?Influenza vaccine: declined ?Pneumococcal vaccine: up to date ?Tdap vaccine: up to date ?Shingles vaccine: up to date   ?Covid-19:Due-May obtain vaccine at your local pharmacy.  ? ?Advanced directives: yes, not on file ? ?Conditions/risks identified: see problem list  ? ?Next appointment: Follow up in one year for your annual wellness visit 02/06/23 ? ? ?Preventive Care 75 Years and Older, Female ?Preventive care refers to lifestyle choices and visits with your health care provider that can promote health and wellness. ?What does preventive care include? ?A yearly physical exam. This is also called an annual well check. ?Dental exams once or twice a year. ?Routine eye exams. Ask your health care provider how often you should have your eyes checked. ?Personal lifestyle choices, including: ?Daily care of your teeth and gums. ?Regular physical activity. ?Eating a healthy diet. ?Avoiding tobacco and drug use. ?Limiting alcohol use. ?Practicing safe sex. ?Taking low-dose aspirin every day. ?Taking vitamin and mineral supplements as recommended by your health care provider. ?What happens during an annual well check? ?The services and screenings done by your health care provider during your annual well check will depend on your age, overall health, lifestyle risk factors, and family history of disease. ?Counseling  ?Your health care provider may ask you questions about your: ?Alcohol use. ?Tobacco use. ?Drug  use. ?Emotional well-being. ?Home and relationship well-being. ?Sexual activity. ?Eating habits. ?History of falls. ?Memory and ability to understand (cognition). ?Work and work Statistician. ?Reproductive health. ?Screening  ?You may have the following tests or measurements: ?Height, weight, and BMI. ?Blood pressure. ?Lipid and cholesterol levels. These may be checked every 5 years, or more frequently if you are over 68 years old. ?Skin check. ?Lung cancer screening. You may have this screening every year starting at age 75 if you have a 30-pack-year history of smoking and currently smoke or have quit within the past 15 years. ?Fecal occult blood test (FOBT) of the stool. You may have this test every year starting at age 75. ?Flexible sigmoidoscopy or colonoscopy. You may have a sigmoidoscopy every 5 years or a colonoscopy every 10 years starting at age 75. ?Hepatitis C blood test. ?Hepatitis B blood test. ?Sexually transmitted disease (STD) testing. ?Diabetes screening. This is done by checking your blood sugar (glucose) after you have not eaten for a while (fasting). You may have this done every 1-3 years. ?Bone density scan. This is done to screen for osteoporosis. You may have this done starting at age 75. ?Mammogram. This may be done every 1-2 years. Talk to your health care provider about how often you should have regular mammograms. ?Talk with your health care provider about your test results, treatment options, and if necessary, the need for more tests. ?Vaccines  ?Your health care provider may recommend certain vaccines, such as: ?Influenza vaccine. This is recommended every year. ?Tetanus, diphtheria, and acellular pertussis (Tdap, Td) vaccine. You may need a Td booster every 10 years. ?Zoster vaccine. You may need this after age 75. ?Pneumococcal 13-valent conjugate (PCV13) vaccine. One dose is recommended after age 75. ?Pneumococcal  polysaccharide (PPSV23) vaccine. One dose is recommended after age  33. ?Talk to your health care provider about which screenings and vaccines you need and how often you need them. ?This information is not intended to replace advice given to you by your health care provider. Make sure you discuss any questions you have with your health care provider. ?Document Released: 11/05/2015 Document Revised: 06/28/2016 Document Reviewed: 08/10/2015 ?Elsevier Interactive Patient Education ? 2017 [employer]. ? ?Fall Prevention in the Home ?Falls can cause injuries. They can happen to people of all ages. There are many things you can do to make your home safe and to help prevent falls. ?What can I do on the outside of my home? ?Regularly fix the edges of walkways and driveways and fix any cracks. ?Remove anything that might make you trip as you walk through a door, such as a raised step or threshold. ?Trim any bushes or trees on the path to your home. ?Use bright outdoor lighting. ?Clear any walking paths of anything that might make someone trip, such as rocks or tools. ?Regularly check to see if handrails are loose or broken. Make sure that both sides of any steps have handrails. ?Any raised decks and porches should have guardrails on the edges. ?Have any leaves, snow, or ice cleared regularly. ?Use sand or salt on walking paths during winter. ?Clean up any spills in your garage right away. This includes oil or grease spills. ?What can I do in the bathroom? ?Use night lights. ?Install grab bars by the toilet and in the tub and shower. Do not use towel bars as grab bars. ?Use non-skid mats or decals in the tub or shower. ?If you need to sit down in the shower, use a plastic, non-slip stool. ?Keep the floor dry. Clean up any water that spills on the floor as soon as it happens. ?Remove soap buildup in the tub or shower regularly. ?Attach bath mats securely with double-sided non-slip rug tape. ?Do not have throw rugs and other things on the floor that can make you trip. ?What can I do in the  bedroom? ?Use night lights. ?Make sure that you have a light by your bed that is easy to reach. ?Do not use any sheets or blankets that are too big for your bed. They should not hang down onto the floor. ?Have a firm chair that has side arms. You can use this for support while you get dressed. ?Do not have throw rugs and other things on the floor that can make you trip. ?What can I do in the kitchen? ?Clean up any spills right away. ?Avoid walking on wet floors. ?Keep items that you use a lot in easy-to-reach places. ?If you need to reach something above you, use a strong step stool that has a grab bar. ?Keep electrical cords out of the way. ?Do not use floor polish or wax that makes floors slippery. If you must use wax, use non-skid floor wax. ?Do not have throw rugs and other things on the floor that can make you trip. ?What can I do with my stairs? ?Do not leave any items on the stairs. ?Make sure that there are handrails on both sides of the stairs and use them. Fix handrails that are broken or loose. Make sure that handrails are as long as the stairways. ?Check any carpeting to make sure that it is firmly attached to the stairs. Fix any carpet that is loose or worn. ?Avoid having throw rugs at the top  or bottom of the stairs. If you do have throw rugs, attach them to the floor with carpet tape. ?Make sure that you have a light switch at the top of the stairs and the bottom of the stairs. If you do not have them, ask someone to add them for you. ?What else can I do to help prevent falls? ?Wear shoes that: ?Do not have high heels. ?Have rubber bottoms. ?Are comfortable and fit you well. ?Are closed at the toe. Do not wear sandals. ?If you use a stepladder: ?Make sure that it is fully opened. Do not climb a closed stepladder. ?Make sure that both sides of the stepladder are locked into place. ?Ask someone to hold it for you, if possible. ?Clearly mark and make sure that you can see: ?Any grab bars or  handrails. ?First and last steps. ?Where the edge of each step is. ?Use tools that help you move around (mobility aids) if they are needed. These include: ?Canes. ?Walkers. ?Scooters. ?Crutches. ?Turn on the lights when

## 2022-02-03 NOTE — Progress Notes (Signed)
? ?Subjective:  ? Mia Newman is a 76 y.o. female who presents for Medicare Annual (Subsequent) preventive examination. ? ?I connected with  Mia Newman on 02/03/22 by a audio enabled telemedicine application and verified that I am speaking with the correct person using two identifiers. ? ?Patient Location: Home ? ?Provider Location: Office/Clinic ? ?I discussed the limitations of evaluation and management by telemedicine. The patient expressed understanding and agreed to proceed.  ? ?Review of Systems    ? ?Cardiac Risk Factors include: advanced age (>54mn, >>28women);dyslipidemia ? ?   ?Objective:  ?  ?There were no vitals filed for this visit. ?There is no height or weight on file to calculate BMI. ? ? ?  02/03/2022  ?  3:05 PM 12/23/2021  ?  3:22 PM 12/20/2019  ?  9:28 AM 08/07/2018  ?  1:46 PM 09/25/2017  ? 10:54 AM 09/21/2017  ? 10:46 AM 09/11/2017  ? 11:00 AM  ?Advanced Directives  ?Does Patient Have a Medical Advance Directive? Yes Yes Yes No No No No  ?Type of [place] D[place] (pink MOST or yellow form);Living will [place]end[place] will Healthcare Power of Attorney      ?Does patient want to make changes to medical advance directive?  Yes (MAU/Ambulatory/Procedural Areas - Information given)       ?Copy of [place] Chart? No - copy requested No - copy requested       ? ? ?Current Medications (verified) ?Outpatient Encounter Medications as of 02/03/2022  ?Medication Sig  ? acetaminophen (TYLENOL) 500 MG tablet Take 1,000 mg by mouth every 6 (six) hours as needed for moderate pain.  ? Ascorbic Acid (VITAMIN C) 1000 MG tablet Take 1,000 mg by mouth daily.  ? ezetimibe (ZETIA) 10 MG tablet Take 1 tablet (10 mg total) by mouth daily.  ? fluticasone (FLONASE) 50 MCG/ACT nasal spray Place 2 sprays into both nostrils daily.  ? latanoprost (XALATAN) 0.005 % ophthalmic solution Place 1 drop into both eyes at bedtime.   ? levocetirizine (XYZAL) 5 MG tablet Take 1 tablet (5 mg total) by mouth every evening.  ? levothyroxine (EUTHYROX) 75 MCG tablet Take 1 tablet (75 mcg total) by mouth daily before breakfast.  ? methocarbamol (ROBAXIN) 500 MG tablet Take 1 tablet (500 mg total) by mouth at bedtime as needed.  ? ?No facility-administered encounter medications on file as of 02/03/2022.  ? ? ?Allergies (verified) ?Atorvastatin, Raloxifene, Simvastatin, and Sulfamethoxazole  ? ?History: ?Past Medical History:  ?Diagnosis Date  ? Allergy   ? Arthritis   ? ASYMPTOMATIC POSTMENOPAUSAL STATUS 03/03/2010  ? DIVERTICULITIS-COLON 05/22/2008  ? DIVERTICULOSIS, COLON 05/20/2008  ? DM 04/22/2008  ? borderline  ? Glaucoma   ? Hyperthyroidism   ? Irritable bowel syndrome   ? Personal history of colonic polyp - adenoma 03/03/2014  ? Pure hypercholesterolemia 09/27/2007  ? Unspecified hypothyroidism 04/22/2008  ? ?Past Surgical History:  ?Procedure Laterality Date  ? ABDOMINAL HYSTERECTOMY  1987 apx  ? BRAIN MENINGIOMA EXCISION    ? removed [facility] gland ([facility]  ? BREAST SURGERY    ? COLONOSCOPY    ? TUBAL LIGATION    ? VSD REPAIR  1996  ? ?Family History  ?Problem Relation Age of Onset  ? Heart attack Father 544 ? Hypertension Mother   ? Cancer Maternal Grandmother   ?     Uterine Cancer  ? Stomach cancer Paternal Grandmother [phone] ?  Colon cancer Neg Hx   ? Esophageal cancer Neg Hx   ? ?Social History  ? ?Socioeconomic History  ? Marital status: Married  ?  Spouse name: Not on file  ? Number of children: Not on file  ? Years of education: Not on file  ? Highest education level: Not on file  ?Occupational History  ? Occupation: Retired  ?Tobacco Use  ? Smoking status: Never  ? Smokeless tobacco: Never  ?Vaping Use  ? Vaping Use: Never used  ?Substance and Sexual Activity  ? Alcohol use: No  ? Drug use: No  ? Sexual activity: Yes  ?  Birth control/protection: None  ?Other Topics Concern  ? Not on file  ?Social History Narrative  ? Not on file  ? ?Social  Determinants of Health  ? ?Financial Resource Strain: Low Risk   ? Difficulty of Paying Living Expenses: Not hard at all  ?Food Insecurity: No Food Insecurity  ? Worried About Charity fundraiser in the Last Year: Never true  ? Ran Out of Food in the Last Year: Never true  ?Transportation Needs: No Transportation Needs  ? Lack of Transportation (Medical): No  ? Lack of Transportation (Non-Medical): No  ?Physical Activity: Inactive  ? Days of Exercise per Week: 0 days  ? Minutes of Exercise per Session: 30 min  ?Stress: No Stress Concern Present  ? Feeling of Stress : Not at all  ?Social Connections: Socially Integrated  ? Frequency of Communication with Friends and Family: More than three times a week  ? Frequency of Social Gatherings with Friends and Family: More than three times a week  ? Attends Religious Services: More than 4 times per year  ? Active Member of Clubs or Organizations: Yes  ? Attends Archivist Meetings: More than 4 times per year  ? Marital Status: Married  ? ? ?Tobacco Counseling ?Counseling given: Not Answered ? ? ?Clinical Intake: ? ?Pre-visit preparation completed: Yes ? ?Pain : No/denies pain ? ?  ? ?Nutritional Risks: None ?Diabetes: No ? ?How often do you need to have someone help you when you read instructions, pamphlets, or other written materials from your doctor or pharmacy?: 1 - Never ? ?Diabetic?No ? ?Interpreter Needed?: No ? ?Information entered by :: [name] ? ? ?Activities of Daily Living ? ?  02/03/2022  ?  3:07 PM  ?In your present state of health, do you have any difficulty performing the following activities:  ?Hearing? 0  ?Vision? 0  ?Difficulty concentrating or making decisions? 0  ?Walking or climbing stairs? 0  ?Dressing or bathing? 0  ?Doing errands, shopping? 0  ?Preparing Food and eating ? N  ?Using the Toilet? N  ?In the past six months, have you accidently leaked urine? N  ?Do you have problems with loss of bowel control? N  ?Managing your  Medications? N  ?Managing your Finances? N  ?Housekeeping or managing your Housekeeping? N  ? ? ?Patient Care Team: ?Marrian Salvage, FNP as PCP - General (Internal Medicine) ?Melissa Noon, Roberta as Referring Physician (Optometry) ?Tomasa Blase, [facility] as Pharmacist (Pharmacist) ? ?Indicate any recent Medical Services you may have received from other than [facility] providers in the past year (date may be approximate). ? ?   ?Assessment:  ? This is a routine wellness examination for [place]. ? ?Hearing/Vision screen ?No results found. ? ?Dietary issues and exercise activities discussed: ?Current Exercise Habits: The patient has a physically strenuous job, but has no regular  exercise apart from work., Exercise limited by: None identified ? ? Goals Addressed   ?None ?  ? ?Depression Screen ? ?  02/03/2022  ?  3:06 PM 12/23/2021  ?  2:56 PM 04/05/2021  ?  1:18 PM 08/14/2018  ?  5:44 PM 06/19/2017  ?  1:29 PM  ?PHQ 2/9 Scores  ?PHQ - 2 Score 0 0 0 0 0  ?  ?Fall Risk ? ?  02/03/2022  ?  3:05 PM 12/23/2021  ?  2:55 PM 04/05/2021  ?  1:18 PM 01/18/2021  ? 12:24 PM 08/14/2018  ?  1:55 PM  ?Fall Risk   ?Falls in the past year? 0 0 0 0 No  ?Number falls in past yr: 0 0 0 0   ?Injury with Fall? 0 0 0 0   ?Risk for fall due to : No Fall Risks No Fall Risks No Fall Risks    ?Follow up Falls evaluation completed Falls evaluation completed Falls evaluation completed Falls evaluation completed   ? ? ?FALL RISK PREVENTION PERTAINING TO THE HOME: ? ?Any stairs in or around the home? Yes  ?If so, are there any without handrails? No  ?Home free of loose throw rugs in walkways, pet beds, electrical cords, etc? Yes  ?Adequate lighting in your home to reduce risk of falls? Yes  ? ?ASSISTIVE DEVICES UTILIZED TO PREVENT FALLS: ? ?Life alert? No  ?Use of a cane, walker or w/c? No  ?Grab bars in the bathroom? No  ?Shower chair or bench in shower? No  ?Elevated toilet seat or a handicapped toilet? Yes  ? ?TIMED UP AND GO: ? ?Was the test performed? No .   ? ? ?Cognitive Function: ?  ?  ? ?  02/03/2022  ?  3:09 PM  ?6CIT Screen  ?What Year? 0 points  ?What month? 0 points  ?What time? 0 points  ?Count back from 20 0 points  ?Months in reverse 2 points  ?Repeat ph

## 2022-02-06 ENCOUNTER — Other Ambulatory Visit (INDEPENDENT_AMBULATORY_CARE_PROVIDER_SITE_OTHER): Payer: Medicare (Managed Care)

## 2022-02-06 DIAGNOSIS — E039 Hypothyroidism, unspecified: Secondary | ICD-10-CM | POA: Diagnosis not present

## 2022-02-06 DIAGNOSIS — E785 Hyperlipidemia, unspecified: Secondary | ICD-10-CM

## 2022-02-07 ENCOUNTER — Telehealth: Payer: Self-pay | Admitting: *Deleted

## 2022-02-07 LAB — LIPID PANEL
Cholesterol: 277 mg/dL — ABNORMAL HIGH (ref 0–200)
HDL: 46.7 mg/dL (ref >39.00)
LDL Cholesterol: 196 mg/dL — ABNORMAL HIGH (ref 0–99)
NonHDL: 230.56
Total CHOL/HDL Ratio: 6
Triglycerides: 171 mg/dL — ABNORMAL HIGH (ref 0.0–149.0)
VLDL: 34.2 mg/dL (ref 0.0–40.0)

## 2022-02-07 LAB — TSH: TSH: 0.85 u[IU]/mL (ref 0.35–5.50)

## 2022-02-07 NOTE — Telephone Encounter (Signed)
Pt called back to follow up on phone call. Please advise.  ?

## 2022-02-07 NOTE — Telephone Encounter (Signed)
Notified pt of below and she voices understanding. ?

## 2022-02-07 NOTE — Telephone Encounter (Signed)
Pt was in the office yesterday for lab appointment and stated that the [facility] told her they needed an order for her DEXA that is scheduled for August. I called the [facility], spoke with [place] and verified they have the co-signed order and do not need anything further.  Attempted to notify pt and left message for her to return my call. ?

## 2022-04-02 ENCOUNTER — Other Ambulatory Visit: Payer: Self-pay | Admitting: Family

## 2022-05-29 DIAGNOSIS — H2513 Age-related nuclear cataract, bilateral: Secondary | ICD-10-CM | POA: Diagnosis not present

## 2022-05-29 DIAGNOSIS — H401233 Low-tension glaucoma, bilateral, severe stage: Secondary | ICD-10-CM | POA: Diagnosis not present

## 2022-05-29 DIAGNOSIS — H5340 Unspecified visual field defects: Secondary | ICD-10-CM | POA: Diagnosis not present

## 2022-05-29 DIAGNOSIS — H4720 Unspecified optic atrophy: Secondary | ICD-10-CM | POA: Diagnosis not present

## 2022-06-09 ENCOUNTER — Ambulatory Visit
Admission: RE | Admit: 2022-06-09 | Discharge: 2022-06-09 | Disposition: A | Discharge status: Home / Self Care | Payer: Medicare (Managed Care) | Source: Ambulatory Visit | Attending: Family | Admitting: Family

## 2022-06-09 ENCOUNTER — Telehealth: Payer: Self-pay

## 2022-06-09 DIAGNOSIS — Z780 Asymptomatic menopausal state: Secondary | ICD-10-CM | POA: Diagnosis not present

## 2022-06-09 DIAGNOSIS — Z13820 Encounter for screening for osteoporosis: Secondary | ICD-10-CM

## 2022-06-09 DIAGNOSIS — M8589 Other specified disorders of bone density and structure, multiple sites: Secondary | ICD-10-CM | POA: Diagnosis not present

## 2022-06-09 DIAGNOSIS — E2839 Other primary ovarian failure: Secondary | ICD-10-CM

## 2022-06-09 NOTE — Telephone Encounter (Signed)
Pt has called back and I have relayed the message from the provider about the supplements and she stated understanding. Pt wrote down the instructions and repeated it back for confirmation.

## 2022-06-30 ENCOUNTER — Ambulatory Visit (INDEPENDENT_AMBULATORY_CARE_PROVIDER_SITE_OTHER): Payer: Medicare (Managed Care) | Admitting: Family Medicine

## 2022-06-30 ENCOUNTER — Encounter: Payer: Self-pay | Admitting: Family Medicine

## 2022-06-30 VITALS — BP 118/78 | HR 70 | Temp 97.5°F | Wt 130.8 lb

## 2022-06-30 DIAGNOSIS — K5792 Diverticulitis of intestine, part unspecified, without perforation or abscess without bleeding: Secondary | ICD-10-CM

## 2022-06-30 LAB — POCT URINALYSIS DIPSTICK
Bilirubin, UA: NEGATIVE
Blood, UA: NEGATIVE
Glucose, UA: NEGATIVE
Ketones, UA: NEGATIVE
Nitrite, UA: NEGATIVE
Protein, UA: NEGATIVE
Spec Grav, UA: 1.015 (ref 1.010–1.025)
Urobilinogen, UA: 0.2 E.U./dL
pH, UA: 7 (ref 5.0–8.0)

## 2022-06-30 NOTE — Progress Notes (Signed)
[facility] [address] [place] [address] Dept: [phone] Dept Fax: [phone]  Office Visit  Subjective:    Patient ID: Mia Newman, female    DOB: 1945/11/14, 76 y.o..   MRN: [id number]  Chief Complaint  Patient presents with   Other    Patient states that she has been experiencing little urination, lower abdominal pain and constipation. She isn't sure if it is connected to her diverticulitis.     History of Present Illness:  Patient is in today complaining of lower abdominal pain since Monday. She notes she has been stressed recently, as her mother passed away about a week ago. She had been managing okay through the weekend, but this developed mild lower abdominal pain earlier this week. She also has noted some constipation and a decreased flow of urine. She denies fever, N/V and has had no blood in her stool. She has a known history of diverticulosis throughout the colon. her last colonoscopy was in April 2022. She routinely eats bananas and applesauce for breakfast, has a Slim Fast for lunch, and eats a small meal at dinner. She admits she may not be hydrating enough this week.  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   Wellness examination 01/04/2012   Asymptomatic postmenopausal status 03/03/2010   PURE HYPERCHOLESTEROLEMIA 09/27/2007   Past Surgical History:  Procedure Laterality Date   ABDOMINAL HYSTERECTOMY  1987 apx   BRAIN MENINGIOMA EXCISION     removed from  pituitary gland ([place])   BREAST SURGERY     COLONOSCOPY     TUBAL LIGATION     VSD REPAIR  1996   Family History  Problem Relation Age of Onset   Heart attack Father 41   Hypertension Mother    Cancer Maternal Grandmother        Uterine Cancer   Stomach cancer Paternal Grandmother 84   Colon cancer Neg Hx    Esophageal cancer Neg Hx    Outpatient Medications Prior to Visit  Medication Sig Dispense Refill   Ascorbic Acid (VITAMIN C) 1000 MG tablet Take 1,000 mg by mouth daily.     IBUPROFEN PO Take by mouth.     latanoprost (XALATAN) 0.005 % ophthalmic solution Place 1 drop into both eyes at bedtime.     levothyroxine (SYNTHROID) 75 MCG tablet Take 1 tablet (75 mcg total) by mouth daily before breakfast. 90 tablet 0   ezetimibe (ZETIA) 10 MG tablet Take 1 tablet (10 mg total) by mouth daily. (Patient not taking: Reported on 06/30/2022) 90 tablet 1   acetaminophen (TYLENOL) 500 MG tablet Take 1,000 mg by mouth every 6 (six) hours as needed for moderate pain. (Patient not taking: Reported on 06/30/2022)     fluticasone (FLONASE) 50 MCG/ACT nasal spray Place 2 sprays into both nostrils daily. (Patient not taking: Reported on 06/30/2022) 16 g 1   levocetirizine (XYZAL) 5 MG tablet Take 1 tablet (5 mg total) by mouth every evening. (Patient not taking: Reported on 06/30/2022) 30 tablet 3   methocarbamol (ROBAXIN) 500 MG tablet Take 1 tablet (500 mg total) by mouth at  bedtime as needed. (Patient not taking: Reported on 06/30/2022) 30 tablet 3   No facility-administered medications prior to visit.   Allergies  Allergen Reactions   Atorvastatin Other (See Comments)    Myalgias   Raloxifene Other (See Comments)    Unknown rxn   Simvastatin Other (See Comments)    Myalgias   Sulfamethoxazole Other (See Comments)    Unknown rxn     Objective:   Today's Vitals   06/30/22 1558  BP: 118/78  Pulse: 70  Temp: (!) 97.5 F (36.4 C)  TempSrc: Temporal  SpO2: 98%  Weight: 130 lb 12.8 oz (59.3 kg)    Body mass index is 24.71 kg/m.   General: Well developed, well nourished. No acute distress. Abdomen: Soft. Bowel sounds positive, normal pitch and frequency. No hepatosplenomegaly. Mild generalized   tenderness with some increased in the suprapubic area and lower quadrants. No rebound or guarding. Back: Straight. No CVA tenderness bilaterally. Psych: Alert and oriented. Normal mood and affect.  There are no preventive care reminders to display for this patient.  Lab Results Component Ref Range & Units 16:25 (06/30/22)  Color, UA  yellow   Clarity, UA  clear   Glucose, UA Negative Negative   Bilirubin, UA  neg   Ketones, UA  neg   Spec Grav, UA 1.010 - 1.025 1.015   Blood, UA  neg   pH, UA 5.0 - 8.0 7.0   Protein, UA Negative Negative   Urobilinogen, UA 0.2 or 1.0 E.U./dL 0.2   Nitrite, UA  neg   Leukocytes, UA Negative Small (1+) Abnormal    Appearance    Odor  yes       Assessment & Plan:   1. Diverticulitis Ms. Zapf may be having a mild case of uncomplicated diverticulitis. Her vital signs are very normal. I will check a CBC. However, I suspect she can be managed expectantly outpatient. I recommend she try and push fluids and continue a lighter diet. We did discuss that antibiotics are not always needed for treatment of mild diverticulitis. If not improving over the weekend, she should plan to see Ms. Valere Dross or myself next week.  - POCT Urinalysis Dipstick - CBC with Differential/Platelet   Return in about 5 days (around 07/05/2022), or if symptoms worsen or fail to improve.   Haydee Salter, MD

## 2022-07-01 LAB — CBC WITH DIFFERENTIAL/PLATELET
Basophils Absolute: 0.1 10*3/uL (ref 0.0–0.2)
Basos: 1 %
EOS (ABSOLUTE): 0.2 10*3/uL (ref 0.0–0.4)
Eos: 2 %
Hematocrit: 42.2 % (ref 34.0–46.6)
Hemoglobin: 14.1 g/dL (ref 11.1–15.9)
Immature Grans (Abs): 0 10*3/uL (ref 0.0–0.1)
Immature Granulocytes: 0 %
Lymphocytes Absolute: 1.8 10*3/uL (ref 0.7–3.1)
Lymphs: 18 %
MCH: 30.1 pg (ref 26.6–33.0)
MCHC: 33.4 g/dL (ref 31.5–35.7)
MCV: 90 fL (ref 79–97)
Monocytes Absolute: 0.9 10*3/uL (ref 0.1–0.9)
Monocytes: 9 %
Neutrophils Absolute: 7 10*3/uL (ref 1.4–7.0)
Neutrophils: 70 %
Platelets: 275 10*3/uL (ref 150–450)
RBC: 4.68 x10E6/uL (ref 3.77–5.28)
RDW: 13.5 % (ref 11.7–15.4)
WBC: 9.9 10*3/uL (ref 3.4–10.8)

## 2022-07-17 ENCOUNTER — Other Ambulatory Visit: Payer: Self-pay | Admitting: Family

## 2022-08-28 ENCOUNTER — Telehealth: Payer: Self-pay

## 2022-08-28 NOTE — Telephone Encounter (Signed)
I have scheduled the appointment for tomorrow to talk about the right shoulder bursitis.

## 2022-08-28 NOTE — Telephone Encounter (Signed)
Nurse Assessment Nurse: Markus Daft, RN, Windy Date/Time Eilene Ghazi Time): 08/26/2022 12:28:13 PM Confirm and document reason for call. If symptomatic, describe symptoms. ---Caller c/o right shoulder pain. Thinks might be bursitis. S/S started in last 3 wks. Rates pain intensity now is 3-5/10. Denies CP or difficulty breathing. No cough or illness, fever. Does the patient have any new or worsening symptoms? ---Yes Will a triage be completed? ---Yes Related visit to physician within the last 2 weeks? ---No Does the PT have any chronic conditions? (i.e. diabetes, asthma, this includes High risk factors for pregnancy, etc.) ---No Is this a behavioral health or substance abuse call? ---No Guidelines Guideline Title Affirmed Question Affirmed Notes Nurse Date/Time Eilene Ghazi Time) Shoulder Pain [1] MODERATE pain (e.g., interferes with normal activities) AND [2] present > 3 days Vivianne Master, Sherre Poot 08/26/2022 12:29:34 PM Disp. Time Eilene Ghazi Time) Disposition Final User 08/26/2022 12:32:58 PM SEE PCP WITHIN 3 DAYS Yes Markus Daft, RN, Sherre Poot PLEASE NOTE: All timestamps contained within this report are represented as [place] Standard Time. CONFIDENTIALTY NOTICE: This fax transmission is intended only for the addressee. It contains information that is legally privileged, confidential or otherwise protected from use or disclosure. If you are not the intended recipient, you are strictly prohibited from reviewing, disclosing, copying using or disseminating any of this information or taking any action in reliance on or regarding this information. If you have received this fax in error, please notify us immediately by telephone so that we can arrange for its return to [place]. Phone: [phone], Toll-Free: [phone], Fax: [phone] Page: 2 of 2 Call Id: [id number] Final Disposition 08/26/2022 12:32:58 PM SEE PCP WITHIN 3 DAYS Yes Markus Daft, RN, Sherre Poot Caller Disagree/Comply Comply Caller Understands  Yes PreDisposition Call Doctor Care Advice Given Per Guideline SEE PCP WITHIN 3 DAYS: * You need to be seen within 2 or 3 days. PAIN MEDICINES: * For pain relief, you can take either acetaminophen, ibuprofen, or naproxen. * Before taking any medicine, read all the instructions on the package. CALL BACK IF: * Severe pain occurs * Chest pain or breathing difficulty occurs * Signs of infection occur (e.g., spreading redness, warmth, fever) * You become worse CARE ADVICE given per Shoulder Pain (Adult) guideline

## 2022-08-29 ENCOUNTER — Ambulatory Visit (INDEPENDENT_AMBULATORY_CARE_PROVIDER_SITE_OTHER): Payer: Medicare (Managed Care) | Admitting: Family

## 2022-08-29 ENCOUNTER — Encounter: Payer: Self-pay | Admitting: Family

## 2022-08-29 VITALS — BP 142/78 | HR 60 | Temp 98.1°F | Ht 61.0 in | Wt 131.8 lb

## 2022-08-29 DIAGNOSIS — M79641 Pain in right hand: Secondary | ICD-10-CM

## 2022-08-29 DIAGNOSIS — M25511 Pain in right shoulder: Secondary | ICD-10-CM

## 2022-08-29 DIAGNOSIS — M79642 Pain in left hand: Secondary | ICD-10-CM

## 2022-08-29 LAB — URIC ACID: Uric Acid, Serum: 4.2 mg/dL (ref 2.4–7.0)

## 2022-08-29 LAB — SEDIMENTATION RATE: Sed Rate: 5 mm/hr (ref 0–30)

## 2022-08-29 NOTE — Progress Notes (Signed)
Mia Newman is a 76 y.o. 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     calcium carbonate (OSCAL) 1500 (600 Ca) MG TABS tablet Take 600 mg of elemental calcium by mouth 2 (two) times daily with a meal.     Cholecalciferol (VITAMIN D-3) 125 MCG (5000 UT) TABS Take by mouth.     ezetimibe (ZETIA) 10 MG tablet Take 1 tablet (10 mg total) by mouth daily. 90 tablet 1   IBUPROFEN PO Take by mouth.     latanoprost (XALATAN) 0.005 % ophthalmic solution Place 1 drop into both eyes at bedtime.     levothyroxine (SYNTHROID) 75 MCG tablet TAKE 1 TABLET BY MOUTH ONCE DAILY BEFORE BREAKFAST 90 tablet 1   No current facility-administered medications for this visit.    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 ([place])   BREAST SURGERY     COLONOSCOPY     TUBAL LIGATION     VSD REPAIR  1996    Family History  Problem Relation Age of Onset   Heart attack Father 46   Hypertension Mother    Cancer Maternal Grandmother        Uterine Cancer   Stomach cancer Paternal Grandmother 1   Colon cancer Neg Hx    Esophageal cancer Neg Hx     Social History   Tobacco Use   Smoking status: Never   Smokeless tobacco: Never  Substance Use Topics   Alcohol use: No    Subjective:   Right shoulder pain x 1 month; no known injury; does not feel able to move completely without some discomfort;  Also concerned about worsening pain/ swelling in both of her hands; has noticed a nodule develop on right hand;     Objective:  Vitals:   08/29/22 1105  BP: (!) 142/78  Pulse: 60  Temp: 98.1 F (36.7 C)  TempSrc: Oral  SpO2: 94%  Weight: 131 lb 12.8 oz (59.8 kg)  Height: '5\' 1"'$  (1.549 m)    General: Well developed, well nourished, in no acute distress  Skin : Warm and dry.  Head: Normocephalic and atraumatic  Lungs: Respirations unlabored;  Musculoskeletal: No deformities; mild swelling noted over right hand- nodule noted over right thumb Extremities: No edema, cyanosis, clubbing  Vessels: Symmetric bilaterally  Neurologic: Alert and oriented; speech intact; face symmetrical; moves all extremities well; CNII-XII intact without focal deficit   Assessment:  1. Bilateral hand pain   2. Acute pain of right shoulder     Plan:  Due to cost concerns, will not do X-ray here; to ask about having done at sports medicine; check ANA, sed rate, RF; Scheduled to see sports medicine tomorrow.   No follow-ups on file.  Orders Placed This Encounter  Procedures   Antinuclear Antib (ANA)   Sedimentation rate   Rheumatoid Factor   Uric acid    Requested Prescriptions    No  prescriptions requested or ordered in this encounter

## 2022-08-30 ENCOUNTER — Ambulatory Visit (INDEPENDENT_AMBULATORY_CARE_PROVIDER_SITE_OTHER): Payer: Medicare (Managed Care) | Admitting: Sports Medicine

## 2022-08-30 VITALS — BP 140/78 | HR 90 | Ht 61.0 in | Wt 131.0 lb

## 2022-08-30 DIAGNOSIS — M25811 Other specified joint disorders, right shoulder: Secondary | ICD-10-CM | POA: Diagnosis not present

## 2022-08-30 DIAGNOSIS — G8929 Other chronic pain: Secondary | ICD-10-CM | POA: Diagnosis not present

## 2022-08-30 DIAGNOSIS — M25511 Pain in right shoulder: Secondary | ICD-10-CM | POA: Diagnosis not present

## 2022-08-30 LAB — ANA: Anti Nuclear Antibody (ANA): NEGATIVE

## 2022-08-30 LAB — RHEUMATOID FACTOR: Rheumatoid fact SerPl-aCnc: <14 IU/mL (ref <14)

## 2022-08-30 NOTE — Patient Instructions (Addendum)
Good to see you Shoulder HEP 3 week follow up  

## 2022-08-30 NOTE — Progress Notes (Signed)
Mia Newman D.[place] Phone: [phone]   Assessment and Plan:     1. Chronic right shoulder pain 2. Impingement of right shoulder -Chronic with exacerbation, initial sports medicine visit - Most consistent with impingement syndrome of right shoulder based on HPI and physical exam.  Differential includes subacromial bursitis and rotator cuff tendinopathy, AC joint arthritis - Discussed using prescription strength NSAIDs, however patient has tried OTC NSAIDs intermittently with only mild and temporary relief of symptoms, so patient elected for subacromial CSI.  Tolerated well per note below - Start HEP for shoulder  Procedure: Subacromial Injection Side: Right  Risks explained and consent was given verbally. The site was cleaned with alcohol prep. A steroid injection was performed from posterior approach using 45m of 1% lidocaine without epinephrine and 147mof kenalog '40mg'$ /ml. This was well tolerated and resulted in symptomatic relief.  Needle was removed, hemostasis achieved, and post injection instructions were explained.   Pt was advised to call or return to clinic if these symptoms worsen or fail to improve as anticipated.   Pertinent previous records reviewed include family medicine note 08/29/2022   Follow Up: 3 weeks for reevaluation.  If no improvement or worsening of symptoms, would obtain x-ray and could consider ultrasound versus advanced imaging   Subjective:   I, Mia Newman serving as a scEducation administratoror Doctor BeGlennon Newman Complaint: right shoulder pain   HPI:   08/30/22 Patient is a [age]ear old female complaining of right shoulder pain. Patient states that it started hurting a couple of months ago , only hurts when she moves it but other than that not so bad , no numbness or tingling , decreased ROM due to pain, no MOI , hx of the same thing in the left shoulder , does yard work  and rakes some, did fall in the living room with her hands hitting the floor but wasn't in to much pain after that a couple months ago was the fall, ibuprofen for the pain and that seems to help .  Relevant Historical Information: Hypothyroidism  Additional pertinent review of systems negative.   Current Outpatient Medications:    Ascorbic Acid (VITAMIN C) 1000 MG tablet, Take 1,000 mg by mouth daily., Disp: , Rfl:    calcium carbonate (OSCAL) 1500 (600 Ca) MG TABS tablet, Take 600 mg of elemental calcium by mouth 2 (two) times daily with a meal., Disp: , Rfl:    Cholecalciferol (VITAMIN D-3) 125 MCG (5000 UT) TABS, Take by mouth., Disp: , Rfl:    IBUPROFEN PO, Take by mouth., Disp: , Rfl:    latanoprost (XALATAN) 0.005 % ophthalmic solution, Place 1 drop into both eyes at bedtime., Disp: , Rfl:    levothyroxine (SYNTHROID) 75 MCG tablet, TAKE 1 TABLET BY MOUTH ONCE DAILY BEFORE BREAKFAST, Disp: 90 tablet, Rfl: 1   ezetimibe (ZETIA) 10 MG tablet, Take 1 tablet (10 mg total) by mouth daily. (Patient not taking: Reported on 08/30/2022), Disp: 90 tablet, Rfl: 1   Objective:     Vitals:   08/30/22 1059  BP: (!) 140/78  Pulse: 90  SpO2: 98%  Weight: 131 lb (59.4 kg)  Height: '5\' 1"'$  (1.549 m)      Body mass index is 24.75 kg/m.    Physical Exam:    Gen: Appears well, nad, nontoxic and pleasant Neuro:sensation intact, strength is 5/5 with df/pf/inv/ev, muscle tone wnl Skin: no suspicious lesion  or defmority Psych: A&O, appropriate mood and affect  Right shoulder: no deformity, swelling or muscle wasting No scapular winging FF 160 with painful arc, abd 160 with painful arc, int 0, ext 90 NTTP over the [place], clavicle, ac, coracoid, biceps groove, humerus, deltoid, trapezius, cervical spine Positive Hawking's, empty can, O'Brien Neg neer,  subscap liftoff, speeds,  , crossarm Neg ant drawer, sulcus sign, apprehension Negative Spurling's test bilat FROM of neck    Electronically  signed by:  Mia Newman D.[facility] 11:42 AM 08/30/22

## 2022-09-18 ENCOUNTER — Ambulatory Visit (INDEPENDENT_AMBULATORY_CARE_PROVIDER_SITE_OTHER): Payer: Medicare (Managed Care) | Admitting: Sports Medicine

## 2022-09-18 VITALS — BP 120/80 | HR 58 | Ht 61.0 in | Wt 131.0 lb

## 2022-09-18 DIAGNOSIS — M25511 Pain in right shoulder: Secondary | ICD-10-CM

## 2022-09-18 DIAGNOSIS — G8929 Other chronic pain: Secondary | ICD-10-CM | POA: Diagnosis not present

## 2022-09-18 DIAGNOSIS — M25811 Other specified joint disorders, right shoulder: Secondary | ICD-10-CM

## 2022-09-18 NOTE — Progress Notes (Signed)
    Mia Newman D.[place] Phone: [phone]   Assessment and Plan:     1. Chronic right shoulder pain 2. Impingement of right shoulder -Chronic with exacerbation, subsequent visit - Significant improvement in right shoulder pain after subacromial CSI at previous office visit on 08/30/2022, consistent with improvement of impingement syndrome - Goal of minimum 3 months relief from CSI, so CSI could be repeated after 11/30/2022 - Continue HEP and Tylenol/NSAIDs as needed    Pertinent previous records reviewed include none   Follow Up: As needed if no improvement or worsening of symptoms   Subjective:   I, Mia Newman, am serving as a Education administrator for Doctor Mia Newman   Chief Complaint: right shoulder pain    HPI:    08/30/22 Patient is a 76 year old female complaining of right shoulder pain. Patient states that it started hurting a couple of months ago , only hurts when she moves it but other than that not so bad , no numbness or tingling , decreased ROM due to pain, no MOI , hx of the same thing in the left shoulder , does yard work and rakes some, did fall in the living room with her hands hitting the floor but wasn't in to much pain after that a couple months ago was the fall, ibuprofen for the pain and that seems to help .   09/18/2022 Patient states that she is doing good , no more pain   Relevant Historical Information: Hypothyroidism  Additional pertinent review of systems negative.   Current Outpatient Medications:    Ascorbic Acid (VITAMIN C) 1000 MG tablet, Take 1,000 mg by mouth daily., Disp: , Rfl:    calcium carbonate (OSCAL) 1500 (600 Ca) MG TABS tablet, Take 600 mg of elemental calcium by mouth 2 (two) times daily with a meal., Disp: , Rfl:    Cholecalciferol (VITAMIN D-3) 125 MCG (5000 UT) TABS, Take by mouth., Disp: , Rfl:    ezetimibe (ZETIA) 10 MG tablet, Take 1 tablet (10 mg  total) by mouth daily., Disp: 90 tablet, Rfl: 1   IBUPROFEN PO, Take by mouth., Disp: , Rfl:    latanoprost (XALATAN) 0.005 % ophthalmic solution, Place 1 drop into both eyes at bedtime., Disp: , Rfl:    levothyroxine (SYNTHROID) 75 MCG tablet, TAKE 1 TABLET BY MOUTH ONCE DAILY BEFORE BREAKFAST, Disp: 90 tablet, Rfl: 1   Objective:     Vitals:   09/18/22 1247  BP: 120/80  Pulse: (!) 58  SpO2: 100%  Weight: 131 lb (59.4 kg)  Height: '5\' 1"'$  (1.549 m)      Body mass index is 24.75 kg/m.    Physical Exam:    Gen: Appears well, nad, nontoxic and pleasant Neuro:sensation intact, strength is 5/5 with df/pf/inv/ev, muscle tone wnl Skin: no suspicious lesion or defmority Psych: A&O, appropriate mood and affect  Right Shoulder: no deformity, swelling or muscle wasting No scapular winging FF 180, abd 180, int 0, ext 90 NTTP over the [place], clavicle, ac, coracoid, biceps groove, humerus, deltoid, trapezius, cervical spine   FROM of neck    Electronically signed by:  Mia Newman D.[facility] 12:55 PM 09/18/22

## 2022-09-18 NOTE — Patient Instructions (Signed)
Good to see you   

## 2022-09-21 ENCOUNTER — Ambulatory Visit: Payer: Medicare (Managed Care) | Admitting: Sports Medicine

## 2022-10-02 DIAGNOSIS — H401233 Low-tension glaucoma, bilateral, severe stage: Secondary | ICD-10-CM | POA: Diagnosis not present

## 2022-10-02 DIAGNOSIS — H25813 Combined forms of age-related cataract, bilateral: Secondary | ICD-10-CM | POA: Diagnosis not present

## 2022-10-02 DIAGNOSIS — H5340 Unspecified visual field defects: Secondary | ICD-10-CM | POA: Diagnosis not present

## 2022-10-02 DIAGNOSIS — H4720 Unspecified optic atrophy: Secondary | ICD-10-CM | POA: Diagnosis not present

## 2022-10-09 ENCOUNTER — Ambulatory Visit: Payer: Medicare (Managed Care) | Admitting: Family Medicine

## 2022-10-09 VITALS — BP 132/60 | HR 86 | Temp 98.2°F | Resp 18 | Ht 61.0 in | Wt 133.2 lb

## 2022-10-09 DIAGNOSIS — R1030 Lower abdominal pain, unspecified: Secondary | ICD-10-CM | POA: Diagnosis not present

## 2022-10-09 LAB — POCT URINALYSIS DIP (MANUAL ENTRY)
Bilirubin, UA: NEGATIVE
Blood, UA: NEGATIVE
Glucose, UA: NEGATIVE mg/dL
Ketones, POC UA: NEGATIVE mg/dL
Leukocytes, UA: NEGATIVE
Nitrite, UA: NEGATIVE
Protein Ur, POC: NEGATIVE mg/dL
Spec Grav, UA: 1.01 (ref 1.010–1.025)
Urobilinogen, UA: 0.2 E.U./dL
pH, UA: 7.5 (ref 5.0–8.0)

## 2022-10-09 MED ORDER — AMOXICILLIN-POT CLAVULANATE 875-125 MG PO TABS: 1.0000 | ORAL_TABLET | Freq: Two times a day (BID) | ORAL | 0 refills | Status: DC

## 2022-10-09 NOTE — Progress Notes (Signed)
[place] [facility] [address], [place], [place] [address] [phone]  Date:  10/09/2022   Name:  Mia Newman   DOB:  [birth year]   MRN:  [id number]  PCP:  Marrian Salvage, FNP    Chief Complaint: Diverticulitis (Flare up x 1.5 weeks. Taking Ibup for the pain. Sxs include low abd pain behind the bladder, no diarrhea. )   History of Present Illness:  Mia Newman is a 76 y.o. very pleasant female patient who presents with the following:  Pt seen today with concern of diverticulitis flare- her most recent flare was in September Pt of Jodi Mourning FNP I have not seen her myself in the past  History of hyperlipidemia, hypothyroidism Most recent CMP done in March   About a week ago she noted onset of hard stools She is also having lower abd pain No fever or chills No blood in her stools Appetite is ok- she is using a bland and soft diet right now Sx seems like a typical diverticulitis flare to her No urinary sx  Patient Active Problem List   Diagnosis Date Noted   Periumbilical abdominal tenderness without rebound tenderness 07/24/2019   Diverticulitis of large intestine without perforation or abscess without bleeding 07/24/2019   Hyperglycemia 08/07/2018   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   Left rotator cuff tear 06/19/2017   Vaginitis and vulvovaginitis 06/09/2014   Meningioma ([place]) 09/02/2013   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   Wellness examination 01/04/2012   Asymptomatic postmenopausal status 03/03/2010   PURE HYPERCHOLESTEROLEMIA 09/27/2007    Past Medical History:  Diagnosis Date   Allergy    Arthritis    ASYMPTOMATIC POSTMENOPAUSAL STATUS 03/03/2010   DIVERTICULITIS-COLON 05/22/2008   DIVERTICULOSIS, COLON 05/20/2008   DM 04/22/2008   borderline   Glaucoma    Hyperthyroidism    Irritable bowel syndrome    Personal history of colonic polyp - adenoma  03/03/2014   Pure hypercholesterolemia 09/27/2007   Unspecified hypothyroidism 04/22/2008    Past Surgical History:  Procedure Laterality Date   ABDOMINAL HYSTERECTOMY  1987 apx   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 ([place])   BREAST SURGERY     COLONOSCOPY     TUBAL LIGATION     VSD REPAIR  1996    Social History   Tobacco Use   Smoking status: Never   Smokeless tobacco: Never  Vaping Use   Vaping Use: Never used  Substance Use Topics   Alcohol use: No   Drug use: No    Family History  Problem Relation Age of Onset   Heart attack Father 66   Hypertension Mother    Cancer Maternal Grandmother        Uterine Cancer   Stomach cancer Paternal Grandmother 85   Colon cancer Neg Hx    Esophageal cancer Neg Hx     Allergies  Allergen Reactions   Atorvastatin Other (See Comments)    Myalgias   Raloxifene Other (See Comments)    Unknown rxn   Simvastatin Other (See Comments)    Myalgias   Sulfamethoxazole Other (See Comments)    Unknown rxn    Medication list has been reviewed and updated.  Current Outpatient Medications on File Prior to Visit  Medication Sig Dispense Refill   Ascorbic Acid (VITAMIN C) 1000 MG tablet Take 1,000 mg by mouth daily.     calcium carbonate (OSCAL) 1500 (  600 Ca) MG TABS tablet Take 600 mg of elemental calcium by mouth 2 (two) times daily with a meal.     Cholecalciferol (VITAMIN D-3) 125 MCG (5000 UT) TABS Take by mouth.     ezetimibe (ZETIA) 10 MG tablet Take 1 tablet (10 mg total) by mouth daily. 90 tablet 1   IBUPROFEN PO Take by mouth.     latanoprost (XALATAN) 0.005 % ophthalmic solution Place 1 drop into both eyes at bedtime.     levothyroxine (SYNTHROID) 75 MCG tablet TAKE 1 TABLET BY MOUTH ONCE DAILY BEFORE BREAKFAST 90 tablet 1   No current facility-administered medications on file prior to visit.    Review of Systems:  As per HPI- otherwise negative.   Physical Examination: Vitals:   10/09/22 1420   BP: 132/60  Pulse: 86  Resp: 18  Temp: 98.2 F (36.8 C)  SpO2: 98%   Vitals:   10/09/22 1420  Weight: 133 lb 3.2 oz (60.4 kg)  Height: '5\' 1"'$  (1.549 m)   Body mass index is 25.17 kg/m. Ideal Body Weight: Weight in (lb) to have BMI = 25: 132  GEN: no acute distress.  Normal weight, looks well HEENT: Atraumatic, Normocephalic.  Ears and Nose: No external deformity. CV: RRR, No M/G/R. No JVD. No thrill. No extra heart sounds. PULM: CTA B, no wheezes, crackles, rhonchi. No retractions. No resp. distress. No accessory muscle use. ABD: S, NT, ND, +BS. No rebound. No HSM Notes mild tenderness across the bilateral lower abdomen  EXTR: No c/c/e PSYCH: Normally interactive. Conversant.   Results for orders placed or performed in visit on 10/09/22  Urine Culture   Specimen: Urine  Result Value Ref Range   MICRO NUMBER: [id number]    SPECIMEN QUALITY: Adequate    Sample Source URINE    STATUS: FINAL    ISOLATE 1: Escherichia coli (A)       Susceptibility   Escherichia coli - URINE CULTURE, REFLEX    AMOX/CLAVULANIC <=2 Sensitive     AMPICILLIN <=2 Sensitive     AMPICILLIN/SULBACTAM <=2 Sensitive     CEFAZOLIN* <=4 Not Reportable      * For infections other than uncomplicated UTI caused by E. coli, K. pneumoniae or P. mirabilis: Cefazolin is resistant if MIC > or = 8 mcg/mL. (Distinguishing susceptible versus intermediate for isolates with MIC < or = 4 mcg/mL requires additional testing.) For uncomplicated UTI caused by E. coli, K. pneumoniae or P. mirabilis: Cefazolin is susceptible if MIC <32 mcg/mL and predicts susceptible to the oral agents cefaclor, cefdinir, cefpodoxime, cefprozil, cefuroxime, cephalexin and loracarbef.     CEFTAZIDIME <=1 Sensitive     CEFEPIME <=1 Sensitive     CEFTRIAXONE <=1 Sensitive     CIPROFLOXACIN >=4 Resistant     LEVOFLOXACIN >=8 Resistant     GENTAMICIN <=1 Sensitive     IMIPENEM <=0.25 Sensitive     NITROFURANTOIN <=16 Sensitive      PIP/TAZO <=4 Sensitive     TOBRAMYCIN <=1 Sensitive     TRIMETH/SULFA* <=20 Sensitive      * For infections other than uncomplicated UTI caused by E. coli, K. pneumoniae or P. mirabilis: Cefazolin is resistant if MIC > or = 8 mcg/mL. (Distinguishing susceptible versus intermediate for isolates with MIC < or = 4 mcg/mL requires additional testing.) For uncomplicated UTI caused by E. coli, K. pneumoniae or P. mirabilis: Cefazolin is susceptible if MIC <32 mcg/mL and predicts susceptible to the oral agents cefaclor, cefdinir, cefpodoxime, cefprozil,  cefuroxime, cephalexin and loracarbef. Legend: S = Susceptible  I = Intermediate R = Resistant  NS = Not susceptible * = Not tested  NR = Not reported **NN = See antimicrobic comments   CBC  Result Value Ref Range   WBC 10.4 4.0 - 10.5 K/uL   RBC 4.35 3.87 - 5.11 Mil/uL   Platelets 282.0 150.0 - 400.0 K/uL   Hemoglobin 13.4 12.0 - 15.0 g/dL   HCT 40.4 36.0 - 46.0 %   MCV 92.9 78.0 - 100.0 fl   MCHC 33.2 30.0 - 36.0 g/dL   RDW 14.6 11.5 - 15.5 %  Comprehensive metabolic panel  Result Value Ref Range   Sodium 139 135 - 145 mEq/L   Potassium 3.9 3.5 - 5.1 mEq/L   Chloride 102 96 - 112 mEq/L   CO2 28 19 - 32 mEq/L   Glucose, Bld 108 (H) 70 - 99 mg/dL   BUN 6 6 - 23 mg/dL   Creatinine, Ser 0.72 0.40 - 1.20 mg/dL   Total Bilirubin 0.5 0.2 - 1.2 mg/dL   Alkaline Phosphatase 58 39 - 117 U/L   AST 11 0 - 37 U/L   ALT 11 0 - 35 U/L   Total Protein 6.3 6.0 - 8.3 g/dL   Albumin 3.8 3.5 - 5.2 g/dL   GFR 81.57 >60.00 mL/min   Calcium 8.9 8.4 - 10.5 mg/dL  POCT urinalysis dipstick  Result Value Ref Range   Color, UA yellow yellow   Clarity, UA clear clear   Glucose, UA negative negative mg/dL   Bilirubin, UA negative negative   Ketones, POC UA negative negative mg/dL   Spec Grav, UA 1.010 1.010 - 1.025   Blood, UA negative negative   pH, UA 7.5 5.0 - 8.0   Protein Ur, POC negative negative mg/dL   Urobilinogen, UA 0.2 0.2 or 1.0  E.U./dL   Nitrite, UA Negative Negative   Leukocytes, UA Negative Negative    Assessment and Plan: Lower abdominal pain - Plan: Urine Culture, POCT urinalysis dipstick, amoxicillin-clavulanate (AUGMENTIN) 875-125 MG tablet, CBC, Comprehensive metabolic panel  Suspect mild diverticulitis Pt notes sx continue despite going on low soft diet Will get labs, urine culture Start on augmentin  Good to see you today- I will be in touch with your urine culture and blood work.   Please continue a light, soft diet until your pain is improved We can treat with augmentin which will treat both diverticulitis and most cases of cystitis  If you are not improving over the next few days please let us know- seek care right away if getting worse   Signed Lamar Blinks, MD  Addendum 12/19, received labs as below.  She does not have MyChart.  Because labs look okay, we will wait and contact her with urine culture  Results for orders placed or performed in visit on 10/09/22  Urine Culture   Specimen: Urine  Result Value Ref Range   MICRO NUMBER: [id number]    SPECIMEN QUALITY: Adequate    Sample Source URINE    STATUS: FINAL    ISOLATE 1: Escherichia coli (A)       Susceptibility   Escherichia coli - URINE CULTURE, REFLEX    AMOX/CLAVULANIC <=2 Sensitive     AMPICILLIN <=2 Sensitive     AMPICILLIN/SULBACTAM <=2 Sensitive     CEFAZOLIN* <=4 Not Reportable      * For infections other than uncomplicated UTI caused by E. coli, K. pneumoniae or P. mirabilis: Cefazolin  is resistant if MIC > or = 8 mcg/mL. (Distinguishing susceptible versus intermediate for isolates with MIC < or = 4 mcg/mL requires additional testing.) For uncomplicated UTI caused by E. coli, K. pneumoniae or P. mirabilis: Cefazolin is susceptible if MIC <32 mcg/mL and predicts susceptible to the oral agents cefaclor, cefdinir, cefpodoxime, cefprozil, cefuroxime, cephalexin and loracarbef.     CEFTAZIDIME <=1 Sensitive      CEFEPIME <=1 Sensitive     CEFTRIAXONE <=1 Sensitive     CIPROFLOXACIN >=4 Resistant     LEVOFLOXACIN >=8 Resistant     GENTAMICIN <=1 Sensitive     IMIPENEM <=0.25 Sensitive     NITROFURANTOIN <=16 Sensitive     PIP/TAZO <=4 Sensitive     TOBRAMYCIN <=1 Sensitive     TRIMETH/SULFA* <=20 Sensitive      * For infections other than uncomplicated UTI caused by E. coli, K. pneumoniae or P. mirabilis: Cefazolin is resistant if MIC > or = 8 mcg/mL. (Distinguishing susceptible versus intermediate for isolates with MIC < or = 4 mcg/mL requires additional testing.) For uncomplicated UTI caused by E. coli, K. pneumoniae or P. mirabilis: Cefazolin is susceptible if MIC <32 mcg/mL and predicts susceptible to the oral agents cefaclor, cefdinir, cefpodoxime, cefprozil, cefuroxime, cephalexin and loracarbef. Legend: S = Susceptible  I = Intermediate R = Resistant  NS = Not susceptible * = Not tested  NR = Not reported **NN = See antimicrobic comments   CBC  Result Value Ref Range   WBC 10.4 4.0 - 10.5 K/uL   RBC 4.35 3.87 - 5.11 Mil/uL   Platelets 282.0 150.0 - 400.0 K/uL   Hemoglobin 13.4 12.0 - 15.0 g/dL   HCT 40.4 36.0 - 46.0 %   MCV 92.9 78.0 - 100.0 fl   MCHC 33.2 30.0 - 36.0 g/dL   RDW 14.6 11.5 - 15.5 %  Comprehensive metabolic panel  Result Value Ref Range   Sodium 139 135 - 145 mEq/L   Potassium 3.9 3.5 - 5.1 mEq/L   Chloride 102 96 - 112 mEq/L   CO2 28 19 - 32 mEq/L   Glucose, Bld 108 (H) 70 - 99 mg/dL   BUN 6 6 - 23 mg/dL   Creatinine, Ser 0.72 0.40 - 1.20 mg/dL   Total Bilirubin 0.5 0.2 - 1.2 mg/dL   Alkaline Phosphatase 58 39 - 117 U/L   AST 11 0 - 37 U/L   ALT 11 0 - 35 U/L   Total Protein 6.3 6.0 - 8.3 g/dL   Albumin 3.8 3.5 - 5.2 g/dL   GFR 81.57 >60.00 mL/min   Calcium 8.9 8.4 - 10.5 mg/dL  POCT urinalysis dipstick  Result Value Ref Range   Color, UA yellow yellow   Clarity, UA clear clear   Glucose, UA negative negative mg/dL   Bilirubin, UA negative  negative   Ketones, POC UA negative negative mg/dL   Spec Grav, UA 1.010 1.010 - 1.025   Blood, UA negative negative   pH, UA 7.5 5.0 - 8.0   Protein Ur, POC negative negative mg/dL   Urobilinogen, UA 0.2 0.2 or 1.0 E.U./dL   Nitrite, UA Negative Negative   Leukocytes, UA Negative Negative   Addendum 12/20, urine culture preliminary is back positive for gram negative bacilli She is on Augmentin, await final report  Addendum 10/21.  Received urine culture susceptibility-E. coli UTI, Augmentin should be effective I called and let her know, went over her labs.  She notes she is feeling much  better.  Advised since UTI is likely her actual illness, okay to stop treatment after 7 days instead of doing the full 10

## 2022-10-09 NOTE — Patient Instructions (Addendum)
Good to see you today- I will be in touch with your urine culture and blood work.   Please continue a light, soft diet until your pain is improved We can treat with augmentin which will treat both diverticulitis and most cases of cystitis  If you are not improving over the next few days please let us know- seek care right away if getting worse

## 2022-10-10 LAB — COMPREHENSIVE METABOLIC PANEL
ALT: 11 U/L (ref 0–35)
AST: 11 U/L (ref 0–37)
Albumin: 3.8 g/dL (ref 3.5–5.2)
Alkaline Phosphatase: 58 U/L (ref 39–117)
BUN: 6 mg/dL (ref 6–23)
CO2: 28 mEq/L (ref 19–32)
Calcium: 8.9 mg/dL (ref 8.4–10.5)
Chloride: 102 mEq/L (ref 96–112)
Creatinine, Ser: 0.72 mg/dL (ref 0.40–1.20)
GFR: 81.57 mL/min (ref >60.00)
Glucose, Bld: 108 mg/dL — ABNORMAL HIGH (ref 70–99)
Potassium: 3.9 mEq/L (ref 3.5–5.1)
Sodium: 139 mEq/L (ref 135–145)
Total Bilirubin: 0.5 mg/dL (ref 0.2–1.2)
Total Protein: 6.3 g/dL (ref 6.0–8.3)

## 2022-10-10 LAB — CBC
HCT: 40.4 % (ref 36.0–46.0)
Hemoglobin: 13.4 g/dL (ref 12.0–15.0)
MCHC: 33.2 g/dL (ref 30.0–36.0)
MCV: 92.9 fl (ref 78.0–100.0)
Platelets: 282 10*3/uL (ref 150.0–400.0)
RBC: 4.35 Mil/uL (ref 3.87–5.11)
RDW: 14.6 % (ref 11.5–15.5)
WBC: 10.4 10*3/uL (ref 4.0–10.5)

## 2022-10-12 LAB — URINE CULTURE
MICRO NUMBER:: 14328395
SPECIMEN QUALITY:: ADEQUATE

## 2022-11-09 NOTE — Progress Notes (Signed)
Mia Newman D.Mia Newman [place] Phone: [phone]   Assessment and Plan:     1. Chronic right shoulder pain 2. Impingement of right shoulder  -Chronic with exacerbation, subsequent visit - Consistent with impingement syndrome versus subacromial bursitis based on HPI and physical exam - Patient had significant relief for 4 weeks after CSI on 08/30/2022 and has had moderate relief until the past few weeks - Patient elected for repeat subacromial CSI.  Patient elected for larger volume to see if this helps lengthen her period of improvement.  Tolerated well per note below - Continue HEP and Tylenol as needed  Procedure: Subacromial Injection Side: Right  Risks explained and consent was given verbally. The site was cleaned with alcohol prep. A steroid injection was performed from posterior approach using 9m of 1% lidocaine without epinephrine and 270mof kenalog '40mg'$ /ml. This was well tolerated and resulted in symptomatic relief.  Needle was removed, hemostasis achieved, and post injection instructions were explained.   Pt was advised to call or return to clinic if these symptoms worsen or fail to improve as anticipated.   Pertinent previous records reviewed include none   Follow Up: 3 to 4 weeks for reevaluation.  If no improvement or worsening of symptoms, could consider intra-articular CSI versus PT versus advanced imaging   Subjective:   I, Mia Newman, am serving as a scEducation administratoror Doctor BeGlennon Newman Chief Complaint: right shoulder pain    HPI:    08/30/22 Patient is a [age]ear old female complaining of right shoulder pain. Patient states that it started hurting a couple of months ago , only hurts when she moves it but other than that not so bad , no numbness or tingling , decreased ROM due to pain, no MOI , hx of the same thing in the left shoulder , does yard work and rakes some, did fall in the living  room with her hands hitting the floor but wasn't in to much pain after that a couple months ago was the fall, ibuprofen for the pain and that seems to help .   09/18/2022 Patient states that she is doing good , no more pain   11/10/2022 Patient states that she has a crunchy feeling right now and a little pain is back    Relevant Historical Information: Hypothyroidism  Additional pertinent review of systems negative.   Current Outpatient Medications:    amoxicillin-clavulanate (AUGMENTIN) 875-125 MG tablet, Take 1 tablet by mouth 2 (two) times daily., Disp: 20 tablet, Rfl: 0   Ascorbic Acid (VITAMIN C) 1000 MG tablet, Take 1,000 mg by mouth daily., Disp: , Rfl:    calcium carbonate (OSCAL) 1500 (600 Ca) MG TABS tablet, Take 600 mg of elemental calcium by mouth 2 (two) times daily with a meal., Disp: , Rfl:    Cholecalciferol (VITAMIN D-3) 125 MCG (5000 UT) TABS, Take by mouth., Disp: , Rfl:    ezetimibe (ZETIA) 10 MG tablet, Take 1 tablet (10 mg total) by mouth daily., Disp: 90 tablet, Rfl: 1   IBUPROFEN PO, Take by mouth., Disp: , Rfl:    latanoprost (XALATAN) 0.005 % ophthalmic solution, Place 1 drop into both eyes at bedtime., Disp: , Rfl:    levothyroxine (SYNTHROID) 75 MCG tablet, TAKE 1 TABLET BY MOUTH ONCE DAILY BEFORE BREAKFAST, Disp: 90 tablet, Rfl: 1   Objective:     Vitals:   11/10/22 1343  BP: 118/78  Pulse: (!Marland Kitchen  56  SpO2: 100%  Weight: 133 lb (60.3 kg)  Height: '5\' 1"'$  (1.549 m)      Body mass index is 25.13 kg/m.    Physical Exam:    Gen: Appears well, nad, nontoxic and pleasant Neuro:sensation intact, strength is 5/5 with df/pf/inv/ev, muscle tone wnl Skin: no suspicious lesion or defmority Psych: A&O, appropriate mood and affect   Right shoulder: no deformity, swelling or muscle wasting No scapular winging FF 160 with painful arc, abd 160 with painful arc, int 0, ext 90 NTTP over the [place], clavicle, ac, coracoid, biceps groove, humerus, deltoid, trapezius,  cervical spine Positive Hawking's, empty can, O'Brien Neg neer,  subscap liftoff, speeds,  , crossarm Neg ant drawer, sulcus sign, apprehension Negative Spurling's test bilat FROM of neck     Electronically signed by:  Mia Newman D.[facility] 2:01 PM 11/10/22

## 2022-11-10 ENCOUNTER — Ambulatory Visit (INDEPENDENT_AMBULATORY_CARE_PROVIDER_SITE_OTHER): Payer: Medicare (Managed Care) | Admitting: Sports Medicine

## 2022-11-10 VITALS — BP 118/78 | HR 56 | Ht 61.0 in | Wt 133.0 lb

## 2022-11-10 DIAGNOSIS — M25511 Pain in right shoulder: Secondary | ICD-10-CM

## 2022-11-10 DIAGNOSIS — G8929 Other chronic pain: Secondary | ICD-10-CM

## 2022-11-10 DIAGNOSIS — M25811 Other specified joint disorders, right shoulder: Secondary | ICD-10-CM

## 2022-11-13 DIAGNOSIS — I10 Essential (primary) hypertension: Secondary | ICD-10-CM | POA: Diagnosis not present

## 2022-11-13 DIAGNOSIS — R1310 Dysphagia, unspecified: Secondary | ICD-10-CM | POA: Diagnosis not present

## 2022-11-13 DIAGNOSIS — R627 Adult failure to thrive: Secondary | ICD-10-CM | POA: Diagnosis not present

## 2022-11-15 DIAGNOSIS — R1313 Dysphagia, pharyngeal phase: Secondary | ICD-10-CM | POA: Diagnosis not present

## 2022-11-15 DIAGNOSIS — I10 Essential (primary) hypertension: Secondary | ICD-10-CM | POA: Diagnosis not present

## 2022-11-15 DIAGNOSIS — R1310 Dysphagia, unspecified: Secondary | ICD-10-CM | POA: Diagnosis not present

## 2022-11-15 DIAGNOSIS — R627 Adult failure to thrive: Secondary | ICD-10-CM | POA: Diagnosis not present

## 2022-11-29 NOTE — Progress Notes (Unsigned)
    Benito Mccreedy D.[place] Phone: [phone]   Assessment and Plan:     There are no diagnoses linked to this encounter.  ***   Pertinent previous records reviewed include ***   Follow Up: ***     Subjective:   I, Denzell Colasanti, am serving as a Education administrator for Doctor Glennon Mac   Chief Complaint: right shoulder pain    HPI:    08/30/22 Patient is a 77 year old female complaining of right shoulder pain. Patient states that it started hurting a couple of months ago , only hurts when she moves it but other than that not so bad , no numbness or tingling , decreased ROM due to pain, no MOI , hx of the same thing in the left shoulder , does yard work and rakes some, did fall in the living room with her hands hitting the floor but wasn't in to much pain after that a couple months ago was the fall, ibuprofen for the pain and that seems to help .   09/18/2022 Patient states that she is doing good , no more pain    11/10/2022 Patient states that she has a crunchy feeling right now and a little pain is back    11/30/2022 Patient states    Relevant Historical Information: Hypothyroidism  Additional pertinent review of systems negative.   Current Outpatient Medications:    amoxicillin-clavulanate (AUGMENTIN) 875-125 MG tablet, Take 1 tablet by mouth 2 (two) times daily., Disp: 20 tablet, Rfl: 0   Ascorbic Acid (VITAMIN C) 1000 MG tablet, Take 1,000 mg by mouth daily., Disp: , Rfl:    calcium carbonate (OSCAL) 1500 (600 Ca) MG TABS tablet, Take 600 mg of elemental calcium by mouth 2 (two) times daily with a meal., Disp: , Rfl:    Cholecalciferol (VITAMIN D-3) 125 MCG (5000 UT) TABS, Take by mouth., Disp: , Rfl:    ezetimibe (ZETIA) 10 MG tablet, Take 1 tablet (10 mg total) by mouth daily., Disp: 90 tablet, Rfl: 1   IBUPROFEN PO, Take by mouth., Disp: , Rfl:    latanoprost (XALATAN) 0.005 % ophthalmic  solution, Place 1 drop into both eyes at bedtime., Disp: , Rfl:    levothyroxine (SYNTHROID) 75 MCG tablet, TAKE 1 TABLET BY MOUTH ONCE DAILY BEFORE BREAKFAST, Disp: 90 tablet, Rfl: 1   Objective:     There were no vitals filed for this visit.    There is no height or weight on file to calculate BMI.    Physical Exam:    ***   Electronically signed by:  Benito Mccreedy D.[facility] 7:36 AM 11/29/22

## 2022-11-30 ENCOUNTER — Ambulatory Visit (INDEPENDENT_AMBULATORY_CARE_PROVIDER_SITE_OTHER): Payer: Medicare (Managed Care) | Admitting: Sports Medicine

## 2022-11-30 VITALS — BP 118/78 | HR 63 | Ht 61.0 in | Wt 132.0 lb

## 2022-11-30 DIAGNOSIS — G8929 Other chronic pain: Secondary | ICD-10-CM

## 2022-11-30 DIAGNOSIS — M25511 Pain in right shoulder: Secondary | ICD-10-CM

## 2022-11-30 DIAGNOSIS — M25811 Other specified joint disorders, right shoulder: Secondary | ICD-10-CM

## 2022-11-30 NOTE — Patient Instructions (Signed)
Good to see you   

## 2023-01-07 ENCOUNTER — Ambulatory Visit (HOSPITAL_COMMUNITY)
Admission: EM | Admit: 2023-01-07 | Discharge: 2023-01-07 | Disposition: A | Discharge status: Home / Self Care | Payer: Medicare (Managed Care) | Attending: Internal Medicine | Admitting: Internal Medicine

## 2023-01-07 ENCOUNTER — Encounter (HOSPITAL_COMMUNITY): Payer: Self-pay | Admitting: Emergency Medicine

## 2023-01-07 DIAGNOSIS — R030 Elevated blood-pressure reading, without diagnosis of hypertension: Secondary | ICD-10-CM

## 2023-01-07 DIAGNOSIS — L03114 Cellulitis of left upper limb: Secondary | ICD-10-CM

## 2023-01-07 DIAGNOSIS — R1030 Lower abdominal pain, unspecified: Secondary | ICD-10-CM | POA: Diagnosis not present

## 2023-01-07 MED ORDER — AMOXICILLIN-POT CLAVULANATE 875-125 MG PO TABS: 1.0000 | ORAL_TABLET | Freq: Two times a day (BID) | ORAL | 0 refills | Status: AC

## 2023-01-07 NOTE — ED Triage Notes (Signed)
Pt got bit by ants Thursday. Left hand swollen, red from finger tips to part way up forearm, and warm to touch. Denies fever.  Pain to hand.

## 2023-01-07 NOTE — Discharge Instructions (Signed)
Take Augmentin antibiotic twice daily for the next 7 days to treat infection to the left hand/arm.  I have marked the area of swelling and redness today.  If the redness spreads in the next 24 to 48 hours despite using the antibiotic or if you develop any new or worsening symptoms such as numbness and tingling to the fingertips of the left upper extremity or fever/chills, I would like for you to go to the nearest emergency department for further workup and evaluation.  Return to urgent care as needed.  I hope you feel better!

## 2023-01-07 NOTE — ED Provider Notes (Signed)
[place]    CSN: [id number] Arrival date & time: 01/07/23  1334      History   Chief Complaint Chief Complaint  Patient presents with   Insect Bite    HPI Mia Newman is a 77 y.o. female.   Patient presents to urgent care for evaluation of left hand swelling, redness, and discomfort that started on Thursday January 04, 2023 (3 days ago) after she was in the garden without gloves pulling weeds and she was bit by black ants. Initially, left hand wound started small and redness/swelling have grown significantly over the last 3 days. Denies significant pain to the left hand. No numbness or tingling to the distal left hand. Denies recent trauma/injury to the left hand/forearm. Redness has spread to the left forearm and has worsened over the last 24 hours. No recent antibiotic or steroid use. Denies fever, chills, body aches, viral URI symptoms, and itching to the affected extremity. Has not used any OTC medicines before coming to urgent care.  Denies history of diabetes or other conditions causing immunocompromised condition/delayed wound healing.  Blood pressure is noticeably elevated in clinic at 187/91.  On recheck, blood pressure reduced to 178/81.  She is not experiencing any headache, dizziness, chest pain, heart palpitations, or shortness of breath.  She states her blood pressure is usually normal.  She takes her blood pressure at home and gets readings in the 140s.  She does not take any antihypertensive medications and believes that it may be elevated because she is at the doctor's office.     Past Medical History:  Diagnosis Date   Allergy    Arthritis    ASYMPTOMATIC POSTMENOPAUSAL STATUS 03/03/2010   DIVERTICULITIS-COLON 05/22/2008   DIVERTICULOSIS, COLON 05/20/2008   DM 04/22/2008   borderline   Glaucoma    Hyperthyroidism    Irritable bowel syndrome    Personal history of colonic polyp - adenoma 03/03/2014   Pure hypercholesterolemia 09/27/2007    Unspecified hypothyroidism 04/22/2008    Patient Active Problem List   Diagnosis Date Noted   Periumbilical abdominal tenderness without rebound tenderness 07/24/2019   Diverticulitis of large intestine without perforation or abscess without bleeding 07/24/2019   Hyperglycemia 08/07/2018   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      Home Medications    Prior to Admission medications   Medication Sig Start Date End Date Taking? Authorizing Provider  Ascorbic Acid (VITAMIN C) 1000 MG tablet Take 1,000 mg by mouth daily.   Yes [provider]  calcium carbonate (OSCAL) 1500 (600 Ca) MG TABS tablet Take 600 mg of elemental calcium by mouth 2 (two) times daily with a meal.   Yes [provider]  Cholecalciferol (VITAMIN D-3) 125 MCG (5000 UT) TABS Take by mouth.   Yes [provider]  ezetimibe (ZETIA) 10 MG tablet Take 1 tablet (10 mg  total) by mouth daily. 12/29/21  Yes Marrian Salvage, FNP  latanoprost (XALATAN) 0.005 % ophthalmic solution Place 1 drop into both eyes at bedtime.   Yes [provider]  levothyroxine (SYNTHROID) 75 MCG tablet TAKE 1 TABLET BY MOUTH ONCE DAILY BEFORE BREAKFAST 07/17/22  Yes Marrian Salvage, FNP  amoxicillin-clavulanate (AUGMENTIN) 875-125 MG tablet Take 1 tablet by mouth 2 (two) times daily for 7 days. 01/07/23 01/14/23  Talbot Grumbling,  FNP  IBUPROFEN PO Take by mouth.    [provider]    Family History Family History  Problem Relation Age of Onset   Heart attack Father 38   Hypertension Mother    Cancer Maternal Grandmother        Uterine Cancer   Stomach cancer Paternal Grandmother 31   Colon cancer Neg Hx    Esophageal cancer Neg Hx     Social History Social History   Tobacco Use   Smoking status: Never   Smokeless tobacco: Never  Vaping Use   Vaping Use: Never used  Substance Use Topics   Alcohol use: No   Drug use: No     Allergies   Atorvastatin, Raloxifene, Simvastatin, and Sulfamethoxazole   Review of Systems Review of Systems Per HPI  Physical Exam Triage Vital Signs ED Triage Vitals  Enc Vitals Group     BP 01/07/23 1424 (!) 187/91     Pulse Rate 01/07/23 1424 (!) 59     Resp 01/07/23 1424 16     Temp 01/07/23 1424 97.8 F (36.6 C)     Temp Source 01/07/23 1424 Oral     SpO2 01/07/23 1424 96 %     Weight 01/07/23 1426 140 lb (63.5 kg)     Height 01/07/23 1426 5\' 1"  (1.549 m)     Head Circumference --      Peak Flow --      Pain Score 01/07/23 1426 8     Pain Loc --      Pain Edu? --      Excl. in [place]? --    No data found.  Updated Vital Signs BP (!) 187/91   Pulse (!) 59   Temp 97.8 F (36.6 C) (Oral)   Resp 16   Ht 5\' 1"  (1.549 m)   Wt 140 lb (63.5 kg)   SpO2 96%   BMI 26.45 kg/m   Visual Acuity Right Eye Distance:   Left Eye Distance:   Bilateral Distance:    Right Eye Near:   Left Eye Near:    Bilateral Near:     Physical Exam Vitals and nursing note reviewed.  Constitutional:      Appearance: She is not ill-appearing or toxic-appearing.  HENT:     Head: Normocephalic and atraumatic.     Right Ear: Hearing and external ear normal.     Left Ear: Hearing and external ear normal.     Nose: Nose normal.     Mouth/Throat:     Lips: Pink.  Eyes:     General: Lids are normal. Vision grossly intact. Gaze aligned appropriately.      Extraocular Movements: Extraocular movements intact.     Conjunctiva/sclera: Conjunctivae normal.  Pulmonary:     Effort: Pulmonary effort is normal.  Musculoskeletal:     Cervical back: Neck supple.  Skin:    General: Skin is warm and dry.     Capillary Refill: Capillary refill takes less than 2 seconds.     Findings:  Erythema and rash present.     Comments: Significant swelling and warmth to the dorsal aspect of the left hand with erythema tracking and spreading proximally to the dorsal aspect of the left forearm.  +2 radial pulses bilaterally.  Normal range of motion to the left hand/left forearm/left wrist.  Strength and sensation intact to the bilateral upper extremities.  There is evidence of bite marks from the ants as seen in images below.  Neurological:     General: No focal deficit present.     Mental Status: She is alert and oriented to person, place, and time. Mental status is at baseline.     Cranial Nerves: No dysarthria or facial asymmetry.  Psychiatric:        Mood and Affect: Mood normal.        Speech: Speech normal.        Behavior: Behavior normal.        Thought Content: Thought content normal.        Judgment: Judgment normal.         UC Treatments / Results  Labs (all labs ordered are listed, but only abnormal results are displayed) Labs Reviewed - No data to display  EKG   Radiology No results found.  Procedures Procedures (including critical care time)  Medications Ordered in UC Medications - No data to display  Initial Impression / Assessment and Plan / UC Course  I have reviewed the triage vital signs and the nursing notes.  Pertinent labs & imaging results that were available during my care of the patient were reviewed by me and considered in my medical decision making (see chart for details).   1.  Cellulitis of left upper extremity, elevated blood-pressure reading without diagnosis of hypertension Presentation is consistent with acute  cellulitis of the left hand and the left forearm.  Area of erythema and swelling marked, patient advised to return to urgent care or go to the nearest emergency department if swelling worsens past marked line in the next 24 to 48 hours with use of antibiotics.  Augmentin antibiotic sent to pharmacy to be taken twice daily as prescribed.  May use Tylenol as needed for pain.  Elevation recommended to reduce swelling. She is not a diabetic and is neurovascularly intact distal to area of swelling and erythema.  Vital signs are hemodynamically stable and she is not exhibiting any systemic signs or symptoms of infection.  Blood pressure is elevated without diagnosis of hypertension.  She is getting good readings of blood pressure at home and elevated blood pressure is likely also due to acute infection.  Advised to continue to monitor this at home and follow-up with her primary care provider to discuss antihypertensive medication possibility should her blood pressures remain elevated.  DASH diet discussed.  Advised to increase exercise to stay healthy and naturally lower blood pressure.  She is agreeable with this plan.  No red flag signs or symptoms indicating need to send to the emergency department.   Discussed physical exam and available lab work findings in clinic with patient.  Counseled patient regarding appropriate use of medications and potential side effects for all medications recommended or prescribed today. Discussed red flag signs and symptoms of worsening condition,when to call the PCP office, return to urgent care, and when to seek higher level of care in the emergency department. Patient verbalizes understanding and agreement with plan. All questions answered. Patient discharged in stable condition.    Final Clinical Impressions(s) / UC Diagnoses  Final diagnoses:  Cellulitis of left upper extremity  Elevated blood pressure reading without diagnosis of hypertension     Discharge  Instructions      Take Augmentin antibiotic twice daily for the next 7 days to treat infection to the left hand/arm.  I have marked the area of swelling and redness today.  If the redness spreads in the next 24 to 48 hours despite using the antibiotic or if you develop any new or worsening symptoms such as numbness and tingling to the fingertips of the left upper extremity or fever/chills, I would like for you to go to the nearest emergency department for further workup and evaluation.  Return to urgent care as needed.  I hope you feel better!     ED Prescriptions     Medication Sig Dispense Auth. Provider   amoxicillin-clavulanate (AUGMENTIN) 875-125 MG tablet Take 1 tablet by mouth 2 (two) times daily for 7 days. 14 tablet Talbot Grumbling, FNP      PDMP not reviewed this encounter.   Talbot Grumbling, [place] 01/07/23 1529

## 2023-01-08 ENCOUNTER — Other Ambulatory Visit: Payer: Self-pay | Admitting: Family

## 2023-01-08 DIAGNOSIS — Z1231 Encounter for screening mammogram for malignant neoplasm of breast: Secondary | ICD-10-CM

## 2023-01-15 ENCOUNTER — Other Ambulatory Visit: Payer: Self-pay | Admitting: Family

## 2023-01-29 DIAGNOSIS — H40023 Open angle with borderline findings, high risk, bilateral: Secondary | ICD-10-CM | POA: Diagnosis not present

## 2023-01-29 DIAGNOSIS — H2513 Age-related nuclear cataract, bilateral: Secondary | ICD-10-CM | POA: Diagnosis not present

## 2023-01-29 DIAGNOSIS — H4720 Unspecified optic atrophy: Secondary | ICD-10-CM | POA: Diagnosis not present

## 2023-01-30 ENCOUNTER — Encounter (HOSPITAL_COMMUNITY): Payer: Self-pay

## 2023-01-30 ENCOUNTER — Ambulatory Visit (HOSPITAL_COMMUNITY)
Admission: EM | Admit: 2023-01-30 | Discharge: 2023-01-30 | Disposition: A | Discharge status: Home / Self Care | Payer: Medicare (Managed Care) | Attending: Emergency Medicine | Admitting: Emergency Medicine

## 2023-01-30 DIAGNOSIS — W57XXXA Bitten or stung by nonvenomous insect and other nonvenomous arthropods, initial encounter: Secondary | ICD-10-CM | POA: Diagnosis not present

## 2023-01-30 DIAGNOSIS — L03113 Cellulitis of right upper limb: Secondary | ICD-10-CM | POA: Diagnosis not present

## 2023-01-30 DIAGNOSIS — L03114 Cellulitis of left upper limb: Secondary | ICD-10-CM | POA: Diagnosis not present

## 2023-01-30 MED ORDER — CEPHALEXIN 500 MG PO CAPS: 500.0000 mg | ORAL_CAPSULE | Freq: Three times a day (TID) | ORAL | 0 refills | Status: AC

## 2023-01-30 MED ORDER — PREDNISONE 20 MG PO TABS: 40.0000 mg | ORAL_TABLET | Freq: Every day | ORAL | 0 refills | Status: DC

## 2023-01-30 NOTE — Discharge Instructions (Signed)
All bug bites on the right and left arm appear to be infected as they are red and swollen and have a puslike center  Begin cephalexin every 8 hours for the next 7 days, takes approximately 48 hours for medicine to get in system and then he should see improvement  Begin prednisone every morning with food for 5 days, this medicine helps to reduce inflammation, helps with pain as well may take Tylenol while using this medication as needed  You may use ice or heat over the affected areas in 10 to 15-minute intervals  You may place arms onto pillow and elevate to help reduce swelling and for comfort  If you have not seeing any improvement in your symptoms after 72 hours of consistent medicine use please follow-up for reevaluation

## 2023-01-30 NOTE — ED Triage Notes (Signed)
Pt states she was working in the yard yesterday and was bit by ants.  Redness noted to bilateral forearms.

## 2023-01-30 NOTE — ED Provider Notes (Addendum)
[facility]    CSN: [id number] Arrival date & time: 01/30/23  1030      History   Chief Complaint Chief Complaint  Patient presents with   Insect Bite    HPI Mia Newman is a 77 y.o. female.   Patient presents for evaluation of edema, redness, pain and swelling to ant bites to the bilateral arms that began 1 day ago.  Was working in her yard when bites occurred, symptoms started shortly after.  Has attempted use of cortisone cream which has been ineffective.  Denies fevers.  Has had similar infection occur to the arms most recently 1 month ago.    Past Medical History:  Diagnosis Date   Allergy    Arthritis    ASYMPTOMATIC POSTMENOPAUSAL STATUS 03/03/2010   DIVERTICULITIS-COLON 05/22/2008   DIVERTICULOSIS, COLON 05/20/2008   DM 04/22/2008   borderline   Glaucoma    Hyperthyroidism    Irritable bowel syndrome    Personal history of colonic polyp - adenoma 03/03/2014   Pure hypercholesterolemia 09/27/2007   Unspecified hypothyroidism 04/22/2008    Patient Active Problem List   Diagnosis Date Noted   Periumbilical abdominal tenderness without rebound tenderness 07/24/2019   Diverticulitis of large intestine without perforation or abscess without bleeding 07/24/2019   Hyperglycemia 08/07/2018   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      Home Medications    Prior to Admission medications    Medication Sig Start Date End Date Taking? Authorizing Provider  Ascorbic Acid (VITAMIN C) 1000 MG tablet Take 1,000 mg by mouth daily.    [provider]  calcium carbonate (OSCAL) 1500 (600 Ca) MG TABS tablet Take 600 mg of elemental calcium by mouth 2 (two) times daily with a meal.    [provider]  Cholecalciferol (VITAMIN D-3) 125 MCG (5000 UT) TABS Take by mouth.    [provider]  ezetimibe (ZETIA) 10 MG tablet Take 1 tablet (10 mg total) by mouth daily. 12/29/21   Olive Bass, FNP  IBUPROFEN PO Take by mouth.    [provider]  latanoprost (XALATAN) 0.005 % ophthalmic solution Place 1 drop into both eyes at bedtime.    [provider]  levothyroxine (SYNTHROID) 75 MCG tablet Take 1 tablet (75 mcg total) by mouth daily before breakfast. 01/15/23   Olive Bass, FNP    Family History Family History  Problem Relation Age of Onset   Heart attack Father 36   Hypertension Mother    Cancer Maternal Grandmother        Uterine Cancer   Stomach cancer Paternal Grandmother 30   Colon cancer Neg Hx    Esophageal cancer Neg Hx     Social History Social History   Tobacco Use   Smoking status: Never   Smokeless tobacco: Never  Vaping Use   Vaping Use: Never used  Substance  Use Topics   Alcohol use: No   Drug use: No     Allergies   Atorvastatin, Raloxifene, Simvastatin, and Sulfamethoxazole   Review of Systems Review of Systems   Physical Exam Triage Vital Signs ED Triage Vitals  Enc Vitals Group     BP 01/30/23 1136 (!) 181/78     Pulse Rate 01/30/23 1136 (!) 58     Resp 01/30/23 1136 16     Temp 01/30/23 1136 98.4 F (36.9 C)     Temp Source 01/30/23 1136 Oral     SpO2 01/30/23 1136 98 %     Weight --      Height --      Head Circumference --      Peak Flow --      Pain Score 01/30/23 1137 0     Pain Loc --      Pain Edu? --      Excl. in [place]? --    No data found.  Updated Vital Signs BP  (!) 181/78 (BP Location: Right Arm)   Pulse (!) 58   Temp 98.4 F (36.9 C) (Oral)   Resp 16   SpO2 98%   Visual Acuity Right Eye Distance:   Left Eye Distance:   Bilateral Distance:    Right Eye Near:   Left Eye Near:    Bilateral Near:     Physical Exam Constitutional:      Appearance: Normal appearance.  Eyes:     Extraocular Movements: Extraocular movements intact.  Pulmonary:     Effort: Pulmonary effort is normal.  Musculoskeletal:     Comments: 3 Less than 0.5 cm puncture marks with puslike drainage with surrounding erythema, swelling and tenderness present to the left forearm  2 less than 0.5 cm puncture marks with puslike drainage with surrounding erythema, swelling and tenderness present to the right forearm  Neurological:     Mental Status: She is alert and oriented to person, place, and time. Mental status is at baseline.      UC Treatments / Results  Labs (all labs ordered are listed, but only abnormal results are displayed) Labs Reviewed - No data to display  EKG   Radiology No results found.  Procedures Procedures (including critical care time)  Medications Ordered in UC Medications - No data to display  Initial Impression / Assessment and Plan / UC Course  I have reviewed the triage vital signs and the nursing notes.  Pertinent labs & imaging results that were available during my care of the patient were reviewed by me and considered in my medical decision making (see chart for details).  Cellulitis of the right and left upper extremity ,Bug bite with infection, initial encounter  Presentation is consistent with above diagnosis, discussed with patient, cephalexin prescribed as well as prednisone to help reduce swelling and for comfort, advised continued use of topical medication if helpful, given strict precautions that if no improvement seen within 72 hours she is to follow-up for reevaluation, additionally may use over-the-counter analgesics,  ice, heat and elevation for additional support Final Clinical Impressions(s) / UC Diagnoses   Final diagnoses:  None   Discharge Instructions   None    ED Prescriptions   None    PDMP not reviewed this encounter.   Valinda HoarWhite, Artie Takayama R, NP 01/30/23 1211    Valinda HoarWhite, Brittin Janik R, NP 02/12/23 [phone]

## 2023-02-05 ENCOUNTER — Emergency Department (HOSPITAL_COMMUNITY)
Admission: EM | Admit: 2023-02-05 | Discharge: 2023-02-05 | Disposition: A | Discharge status: Home / Self Care | Payer: Medicare (Managed Care) | Attending: Emergency Medicine | Admitting: Emergency Medicine

## 2023-02-05 ENCOUNTER — Encounter (HOSPITAL_COMMUNITY): Payer: Self-pay

## 2023-02-05 ENCOUNTER — Emergency Department (HOSPITAL_COMMUNITY): Payer: Medicare (Managed Care)

## 2023-02-05 DIAGNOSIS — R0789 Other chest pain: Secondary | ICD-10-CM | POA: Diagnosis not present

## 2023-02-05 DIAGNOSIS — G4489 Other headache syndrome: Secondary | ICD-10-CM | POA: Diagnosis not present

## 2023-02-05 DIAGNOSIS — R079 Chest pain, unspecified: Secondary | ICD-10-CM | POA: Diagnosis not present

## 2023-02-05 DIAGNOSIS — I10 Essential (primary) hypertension: Secondary | ICD-10-CM | POA: Diagnosis not present

## 2023-02-05 LAB — CBC
HCT: 43.4 % (ref 36.0–46.0)
Hemoglobin: 14.1 g/dL (ref 12.0–15.0)
MCH: 30.8 pg (ref 26.0–34.0)
MCHC: 32.5 g/dL (ref 30.0–36.0)
MCV: 94.8 fL (ref 80.0–100.0)
Platelets: 274 10*3/uL (ref 150–400)
RBC: 4.58 MIL/uL (ref 3.87–5.11)
RDW: 14.6 % (ref 11.5–15.5)
WBC: 9.3 10*3/uL (ref 4.0–10.5)
nRBC: 0 % (ref 0.0–0.2)

## 2023-02-05 LAB — TROPONIN I (HIGH SENSITIVITY)
Troponin I (High Sensitivity): <2 ng/L (ref <18)
Troponin I (High Sensitivity): 2 ng/L (ref <18)

## 2023-02-05 LAB — BASIC METABOLIC PANEL
Anion gap: 12 (ref 5–15)
BUN: 8 mg/dL (ref 8–23)
CO2: 23 mmol/L (ref 22–32)
Calcium: 8.7 mg/dL — ABNORMAL LOW (ref 8.9–10.3)
Chloride: 99 mmol/L (ref 98–111)
Creatinine, Ser: 0.76 mg/dL (ref 0.44–1.00)
GFR, Estimated: >60 mL/min (ref >60)
Glucose, Bld: 95 mg/dL (ref 70–99)
Potassium: 3.4 mmol/L — ABNORMAL LOW (ref 3.5–5.1)
Sodium: 134 mmol/L — ABNORMAL LOW (ref 135–145)

## 2023-02-05 MED ORDER — ACETAMINOPHEN 325 MG PO TABS
650.0000 mg | ORAL_TABLET | Freq: Once | ORAL | Status: AC
  Administered 2023-02-05: 650 mg via ORAL
  Filled 2023-02-05: qty 2

## 2023-02-05 NOTE — ED Triage Notes (Addendum)
Pt bib ems coming from home. Reports chest pain starting at 1330 while watching tv. Denies n/v, sob, dizziness. Had similar pain on Thursday but resolved on its own. Pt received 324 aspirin and 1 sublingual nitro. Denies chest pain at this time.   Ems vitals: Bp 227/109 prior to nitro Hr 63 97% room air CBG 129

## 2023-02-05 NOTE — ED Provider Notes (Signed)
Care of patient assumed from Dr. Rubin Payor.  This patient presented for chest pain, now resolved.  Initial troponin is reassuring.  Currently awaiting delta troponin.  If negative, can be discharged with outpatient follow-up. Physical Exam  BP (!) 184/79   Pulse 63   Temp 97.7 F (36.5 C) (Oral)   Resp 16   SpO2 98%   Physical Exam Vitals and nursing note reviewed.  Constitutional:      General: She is not in acute distress.    Appearance: She is well-developed. She is not ill-appearing or diaphoretic.  HENT:     Head: Normocephalic and atraumatic.  Eyes:     Conjunctiva/sclera: Conjunctivae normal.  Cardiovascular:     Rate and Rhythm: Normal rate and regular rhythm.  Pulmonary:     Effort: Pulmonary effort is normal. No tachypnea or respiratory distress.  Abdominal:     Palpations: Abdomen is soft.  Musculoskeletal:        General: No swelling. Normal range of motion.     Cervical back: Normal range of motion and neck supple.     Right lower leg: No edema.     Left lower leg: No edema.  Skin:    General: Skin is warm and dry.     Coloration: Skin is not cyanotic or pale.  Neurological:     General: No focal deficit present.     Mental Status: She is alert and oriented to person, place, and time.  Psychiatric:        Mood and Affect: Mood normal.        Behavior: Behavior normal.     Procedures  Procedures  ED Course / MDM    Medical Decision Making Amount and/or Complexity of Data Reviewed Labs: ordered. Radiology: ordered.  Risk OTC drugs.   On assessment, patient resting in ED stretcher.  She states that her she has had continued resolution of her chest pain.  She does state that she now has a generalized headache.  Tylenol was ordered.  Blood pressure remains elevated in the range of 180s over 80s.  Patient states that her blood pressure is typically normal.  On reassessment, headache has resolved.  She remains chest pain-free.  Delta troponin was normal.   Patient was advised to monitor blood pressures at home and talk to her PCP about initiating antihypertensive medication if blood pressure remains high.  She is agreeable to cardiology follow-up.  Cardiology referral was ordered.  Patient was discharged in stable condition.       Gloris Manchester, MD 02/05/23 [phone]

## 2023-02-05 NOTE — Discharge Instructions (Addendum)
Monitor your blood pressure at home.  If blood pressures remain elevated, you would benefit from initiating a blood pressure medication.  Talk to your PCP about this.  If you do not hear from cardiologist in the next couple days, call the number below.  Return to the emergency department for any new or worsening symptoms of concern.

## 2023-02-05 NOTE — ED Provider Notes (Signed)
[place] EMERGENCY DEPARTMENT AT [facility] Provider Note   CSN: [id number] Arrival date & time: 02/05/23  1401     History  Chief Complaint  Patient presents with   Chest Pain    NERIAH ARGETSINGER is a 77 y.o. female.   Chest Pain Patient presents with chest pain.  Anterior chest.  Began around 130.  States she was watching TV.  Has had a previous episode like this on Thursday.  Also was just sitting around when it happened.  Has not had any exertional pain.  Had been given aspirin nitro by EMS.  States pain is improved.  Blood pressure was reportedly 227/109 by EMS.  It is 197/84 upon arrival.  Patient denies history of hypertension.  No swelling in legs.  No cough.  No pain with eating.  No previous cardiac workup.     Home Medications Prior to Admission medications   Medication Sig Start Date End Date Taking? Authorizing Provider  Ascorbic Acid (VITAMIN C) 1000 MG tablet Take 1,000 mg by mouth daily.    [provider]  calcium carbonate (OSCAL) 1500 (600 Ca) MG TABS tablet Take 600 mg of elemental calcium by mouth 2 (two) times daily with a meal.    [provider]  cephALEXin (KEFLEX) 500 MG capsule Take 1 capsule (500 mg total) by mouth 3 (three) times daily for 7 days. 01/30/23 02/06/23  Valinda Hoar, NP  Cholecalciferol (VITAMIN D-3) 125 MCG (5000 UT) TABS Take by mouth.    [provider]  ezetimibe (ZETIA) 10 MG tablet Take 1 tablet (10 mg total) by mouth daily. 12/29/21   Olive Bass, FNP  IBUPROFEN PO Take by mouth.    [provider]  latanoprost (XALATAN) 0.005 % ophthalmic solution Place 1 drop into both eyes at bedtime.    [provider]  levothyroxine (SYNTHROID) 75 MCG tablet Take 1 tablet (75 mcg total) by mouth daily before breakfast. 01/15/23   Olive Bass, FNP  predniSONE (DELTASONE) 20 MG tablet Take 2 tablets (40 mg total) by mouth daily. 01/30/23   Valinda Hoar, NP       Allergies    Atorvastatin, Raloxifene, Simvastatin, and Sulfamethoxazole    Review of Systems   Review of Systems  Cardiovascular:  Positive for chest pain.    Physical Exam Updated Vital Signs BP (!) 197/84   Pulse (!) 57   Temp 98 F (36.7 C) (Oral)   Resp 16   SpO2 100%  Physical Exam Vitals and nursing note reviewed.  Cardiovascular:     Rate and Rhythm: Regular rhythm.  Pulmonary:     Breath sounds: No wheezing, rhonchi or rales.  Chest:     Chest wall: No tenderness.  Abdominal:     Tenderness: There is no abdominal tenderness.  Musculoskeletal:     Right lower leg: No edema.     Left lower leg: No edema.  Skin:    General: Skin is warm.  Neurological:     Mental Status: She is alert.     ED Results / Procedures / Treatments   Labs (all labs ordered are listed, but only abnormal results are displayed) Labs Reviewed  BASIC METABOLIC PANEL - Abnormal; Notable for the following components:      Result Value   Sodium 134 (*)    Potassium 3.4 (*)    Calcium 8.7 (*)    All other components within normal limits  CBC  TROPONIN I (  HIGH SENSITIVITY)  TROPONIN I (HIGH SENSITIVITY)    EKG EKG Interpretation  Date/Time:  Monday February 05 2023 14:12:07 EDT Ventricular Rate:  59 PR Interval:  163 QRS Duration: 95 QT Interval:  453 QTC Calculation: 449 R Axis:   -37 Text Interpretation: Sinus rhythm Left ventricular hypertrophy Anterior Q waves, possibly due to LVH No old tracing to compare Confirmed by Benjiman Core [phone]) on 02/05/2023 2:15:14 PM  Radiology DG Chest Portable 1 View  Result Date: 02/05/2023 CLINICAL DATA:  Chest pain. Tingling down left arm. Symptoms began today. EXAM: PORTABLE CHEST 1 VIEW COMPARISON:  07/24/2019. FINDINGS: Normal heart, mediastinum and hila. Minor linear atelectasis or scarring, left lateral lung base. Lungs otherwise clear. No convincing pleural effusion.  No pneumothorax. Skeletal structures are grossly intact.  IMPRESSION: No active disease. Electronically Signed   By: Amie Portland M.D.   On: 02/05/2023 15:05    Procedures Procedures    Medications Ordered in ED Medications - No data to display  ED Course/ Medical Decision Making/ A&P                             Medical Decision Making Amount and/or Complexity of Data Reviewed Labs: ordered. Radiology: ordered.   Patient with chest pain.  Anterior chest.  Differential diagnoses long but includes causes such as nonspecific chest pain, esophagitis, coronary disease, ACS, pneumonia.  Will get basic blood work and chest x-ray.  EKG independently interpreted reassuring.  No old tracing to compare.  Initial troponin negative.  Initial chest x-ray reassuring.  Pain resolved.  Will check delta troponin and if negative I think should be able to follow-up as an outpatient.  Care turned over to Dr. Durwin Nora        Final Clinical Impression(s) / ED Diagnoses Final diagnoses:  Chest pain, unspecified type    Rx / DC Orders ED Discharge Orders          Ordered    Ambulatory referral to Cardiology       Comments: If you have not heard from the Cardiology office within the next 72 hours please call [phone].   02/05/23 1605              Benjiman Core, MD 02/05/23 1605

## 2023-02-06 ENCOUNTER — Ambulatory Visit: Payer: Medicare (Managed Care)

## 2023-02-06 ENCOUNTER — Telehealth: Payer: Self-pay | Admitting: Family

## 2023-02-06 ENCOUNTER — Ambulatory Visit (INDEPENDENT_AMBULATORY_CARE_PROVIDER_SITE_OTHER): Payer: Medicare (Managed Care) | Admitting: *Deleted

## 2023-02-06 VITALS — BP 124/74 | HR 64 | Ht 61.0 in | Wt 132.0 lb

## 2023-02-06 DIAGNOSIS — Z0000 Encounter for general adult medical examination without abnormal findings: Secondary | ICD-10-CM

## 2023-02-06 NOTE — Telephone Encounter (Signed)
Contacted Gilford Rile Doner to schedule their annual wellness visit. Appointment made for 02/16/2023.  Verlee Rossetti; Care Guide Ambulatory Clinical Support [place] [facility] Direct Dial: [phone]

## 2023-02-06 NOTE — Patient Instructions (Signed)
Mia Newman , Thank you for taking time to come for your Medicare Wellness Visit. I appreciate your ongoing commitment to your health goals. Please review the following plan we discussed and let me know if I can assist you in the future.   These are the goals we discussed:  Goals   None     This is a list of the screening recommended for you and due dates:  Health Maintenance  Topic Date Due   COVID-19 Vaccine (4 - 2023-24 season) 06/23/2022   Flu Shot  05/24/2023   Medicare Annual Wellness Visit  02/06/2024   Colon Cancer Screening  02/08/2026   DTaP/Tdap/Td vaccine (3 - Td or Tdap) 03/05/2030   Pneumonia Vaccine  Completed   DEXA scan (bone density measurement)  Completed   Hepatitis C Screening: USPSTF Recommendation to screen - Ages 10-79 yo.  Completed   Zoster (Shingles) Vaccine  Completed   HPV Vaccine  Aged Out     Next appointment: Follow up in one year for your annual wellness visit.   Preventive Care 66 Years and Older, Female Preventive care refers to lifestyle choices and visits with your health care provider that can promote health and wellness. What does preventive care include? A yearly physical exam. This is also called an annual well check. Dental exams once or twice a year. Routine eye exams. Ask your health care provider how often you should have your eyes checked. Personal lifestyle choices, including: Daily care of your teeth and gums. Regular physical activity. Eating a healthy diet. Avoiding tobacco and drug use. Limiting alcohol use. Practicing safe sex. Taking low-dose aspirin every day. Taking vitamin and mineral supplements as recommended by your health care provider. What happens during an annual well check? The services and screenings done by your health care provider during your annual well check will depend on your age, overall health, lifestyle risk factors, and family history of disease. Counseling  Your health care provider may ask you  questions about your: Alcohol use. Tobacco use. Drug use. Emotional well-being. Home and relationship well-being. Sexual activity. Eating habits. History of falls. Memory and ability to understand (cognition). Work and work Astronomer. Reproductive health. Screening  You may have the following tests or measurements: Height, weight, and BMI. Blood pressure. Lipid and cholesterol levels. These may be checked every 5 years, or more frequently if you are over 3 years old. Skin check. Lung cancer screening. You may have this screening every year starting at age 3 if you have a 30-pack-year history of smoking and currently smoke or have quit within the past 15 years. Fecal occult blood test (FOBT) of the stool. You may have this test every year starting at age 17. Flexible sigmoidoscopy or colonoscopy. You may have a sigmoidoscopy every 5 years or a colonoscopy every 10 years starting at age 76. Hepatitis C blood test. Hepatitis B blood test. Sexually transmitted disease (STD) testing. Diabetes screening. This is done by checking your blood sugar (glucose) after you have not eaten for a while (fasting). You may have this done every 1-3 years. Bone density scan. This is done to screen for osteoporosis. You may have this done starting at age 25. Mammogram. This may be done every 1-2 years. Talk to your health care provider about how often you should have regular mammograms. Talk with your health care provider about your test results, treatment options, and if necessary, the need for more tests. Vaccines  Your health care provider may recommend certain vaccines,  such as: Influenza vaccine. This is recommended every year. Tetanus, diphtheria, and acellular pertussis (Tdap, Td) vaccine. You may need a Td booster every 10 years. Zoster vaccine. You may need this after age 36. Pneumococcal 13-valent conjugate (PCV13) vaccine. One dose is recommended after age 63. Pneumococcal polysaccharide  (PPSV23) vaccine. One dose is recommended after age 77. Talk to your health care provider about which screenings and vaccines you need and how often you need them. This information is not intended to replace advice given to you by your health care provider. Make sure you discuss any questions you have with your health care provider. Document Released: 11/05/2015 Document Revised: 06/28/2016 Document Reviewed: 08/10/2015 Elsevier Interactive Patient Education  2017 [place] [employer] Prevention in the Home Falls can cause injuries. They can happen to people of all ages. There are many things you can do to make your home safe and to help prevent falls. What can I do on the outside of my home? Regularly fix the edges of walkways and driveways and fix any cracks. Remove anything that might make you trip as you walk through a door, such as a raised step or threshold. Trim any bushes or trees on the path to your home. Use bright outdoor lighting. Clear any walking paths of anything that might make someone trip, such as rocks or tools. Regularly check to see if handrails are loose or broken. Make sure that both sides of any steps have handrails. Any raised decks and porches should have guardrails on the edges. Have any leaves, snow, or ice cleared regularly. Use sand or salt on walking paths during winter. Clean up any spills in your garage right away. This includes oil or grease spills. What can I do in the bathroom? Use night lights. Install grab bars by the toilet and in the tub and shower. Do not use towel bars as grab bars. Use non-skid mats or decals in the tub or shower. If you need to sit down in the shower, use a plastic, non-slip stool. Keep the floor dry. Clean up any water that spills on the floor as soon as it happens. Remove soap buildup in the tub or shower regularly. Attach bath mats securely with double-sided non-slip rug tape. Do not have throw rugs and other things on the  floor that can make you trip. What can I do in the bedroom? Use night lights. Make sure that you have a light by your bed that is easy to reach. Do not use any sheets or blankets that are too big for your bed. They should not hang down onto the floor. Have a firm chair that has side arms. You can use this for support while you get dressed. Do not have throw rugs and other things on the floor that can make you trip. What can I do in the kitchen? Clean up any spills right away. Avoid walking on wet floors. Keep items that you use a lot in easy-to-reach places. If you need to reach something above you, use a strong step stool that has a grab bar. Keep electrical cords out of the way. Do not use floor polish or wax that makes floors slippery. If you must use wax, use non-skid floor wax. Do not have throw rugs and other things on the floor that can make you trip. What can I do with my stairs? Do not leave any items on the stairs. Make sure that there are handrails on both sides of the stairs and  use them. Fix handrails that are broken or loose. Make sure that handrails are as long as the stairways. Check any carpeting to make sure that it is firmly attached to the stairs. Fix any carpet that is loose or worn. Avoid having throw rugs at the top or bottom of the stairs. If you do have throw rugs, attach them to the floor with carpet tape. Make sure that you have a light switch at the top of the stairs and the bottom of the stairs. If you do not have them, ask someone to add them for you. What else can I do to help prevent falls? Wear shoes that: Do not have high heels. Have rubber bottoms. Are comfortable and fit you well. Are closed at the toe. Do not wear sandals. If you use a stepladder: Make sure that it is fully opened. Do not climb a closed stepladder. Make sure that both sides of the stepladder are locked into place. Ask someone to hold it for you, if possible. Clearly mark and make  sure that you can see: Any grab bars or handrails. First and last steps. Where the edge of each step is. Use tools that help you move around (mobility aids) if they are needed. These include: Canes. Walkers. Scooters. Crutches. Turn on the lights when you go into a dark area. Replace any light bulbs as soon as they burn out. Set up your furniture so you have a clear path. Avoid moving your furniture around. If any of your floors are uneven, fix them. If there are any pets around you, be aware of where they are. Review your medicines with your doctor. Some medicines can make you feel dizzy. This can increase your chance of falling. Ask your doctor what other things that you can do to help prevent falls. This information is not intended to replace advice given to you by your health care provider. Make sure you discuss any questions you have with your health care provider. Document Released: 08/05/2009 Document Revised: 03/16/2016 Document Reviewed: 11/13/2014 Elsevier Interactive Patient Education  2017 [employer].

## 2023-02-06 NOTE — Progress Notes (Signed)
Subjective:   Mia Newman is a 77 y.o. female who presents for Medicare Annual (Subsequent) preventive examination.  I connected with  Gilford Rile Emory on 02/06/23 by a audio enabled telemedicine application and verified that I am speaking with the correct person using two identifiers.  Patient Location: Home  Provider Location: Office/Clinic  I discussed the limitations of evaluation and management by telemedicine. The patient expressed understanding and agreed to proceed.   Review of Systems     Cardiac Risk Factors include: advanced age (>42men, >67 women)     Objective:    Today's Vitals   02/06/23 1500  BP: 124/74  Pulse: 64  Weight: 132 lb (59.9 kg)  Height: 5\' 1"  (1.549 m)   Body mass index is 24.94 kg/m.     02/06/2023    3:03 PM 02/03/2022    3:05 PM 12/23/2021    3:22 PM 12/20/2019    9:28 AM 08/07/2018    1:46 PM 09/25/2017   10:54 AM 09/21/2017   10:46 AM  Advanced Directives  Does Patient Have a Medical Advance Directive? Yes Yes Yes Yes No No No  Type of Estate agent of [place];Living will Healthcare Power of [place];Out of facility DNR (pink MOST or yellow form);Living will Healthcare Power of [place];Living will Healthcare Power of Attorney     Does patient want to make changes to medical advance directive? No - Patient declined  Yes (MAU/Ambulatory/Procedural Areas - Information given)      Copy of Healthcare Power of Attorney in Chart? No - copy requested No - copy requested No - copy requested        Current Medications (verified) Outpatient Encounter Medications as of 02/06/2023  Medication Sig   Ascorbic Acid (VITAMIN C) 1000 MG tablet Take 1,000 mg by mouth daily.   calcium carbonate (OSCAL) 1500 (600 Ca) MG TABS tablet Take 600 mg of elemental calcium by mouth 2 (two) times daily with a meal.   cephALEXin (KEFLEX) 500 MG capsule Take 1 capsule (500 mg total) by mouth 3 (three) times daily for 7 days.    Cholecalciferol (VITAMIN D-3) 125 MCG (5000 UT) TABS Take by mouth.   ezetimibe (ZETIA) 10 MG tablet Take 1 tablet (10 mg total) by mouth daily.   IBUPROFEN PO Take by mouth.   latanoprost (XALATAN) 0.005 % ophthalmic solution Place 1 drop into both eyes at bedtime.   levothyroxine (SYNTHROID) 75 MCG tablet Take 1 tablet (75 mcg total) by mouth daily before breakfast.   predniSONE (DELTASONE) 20 MG tablet Take 2 tablets (40 mg total) by mouth daily.   No facility-administered encounter medications on file as of 02/06/2023.    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 [facility])   BREAST SURGERY     COLONOSCOPY     TUBAL LIGATION     VSD REPAIR  1996   Family History  Problem  Relation Age of Onset   Heart attack Father 52   Hypertension Mother    Cancer Maternal Grandmother        Uterine Cancer   Stomach cancer Paternal Grandmother 60   Colon cancer Neg Hx    Esophageal cancer Neg Hx    Social History   Socioeconomic History   Marital status: Married    Spouse name: Not on file   Number of children: Not on file   Years of education: Not on file   Highest education level: Not on file  Occupational History   Occupation: Retired  Tobacco Use   Smoking status: Never   Smokeless tobacco: Never  Vaping Use   Vaping Use: Never used  Substance and Sexual Activity   Alcohol use: No    Drug use: No   Sexual activity: Yes    Birth control/protection: None  Other Topics Concern   Not on file  Social History Narrative   Not on file   Social Determinants of Health   Financial Resource Strain: Low Risk  (02/03/2022)   Overall Financial Resource Strain (CARDIA)    Difficulty of Paying Living Expenses: Not hard at all  Food Insecurity: No Food Insecurity (02/06/2023)   Hunger Vital Sign    Worried About Running Out of Food in the Last Year: Never true    Ran Out of Food in the Last Year: Never true  Transportation Needs: No Transportation Needs (02/06/2023)   PRAPARE - Administrator, Civil Service (Medical): No    Lack of Transportation (Non-Medical): No  Physical Activity: Inactive (02/03/2022)   Exercise Vital Sign    Days of Exercise per Week: 0 days    Minutes of Exercise per Session: 30 min  Stress: No Stress Concern Present (02/03/2022)   [employer] of Occupational Health - Occupational Stress Questionnaire    Feeling of Stress : Not at all  Social Connections: Socially Integrated (02/03/2022)   Social Connection and Isolation Panel [NHANES]    Frequency of Communication with Friends and Family: More than three times a week    Frequency of Social Gatherings with Friends and Family: More than three times a week    Attends Religious Services: More than 4 times per year    Active Member of [employer] or Organizations: Yes    Attends Engineer, structural: More than 4 times per year    Marital Status: Married    Tobacco Counseling Counseling given: Not Answered   Clinical Intake:  Pre-visit preparation completed: Yes  Pain : No/denies pain  BMI - recorded: 24.94 Nutritional Status: BMI of 19-24  Normal Nutritional Risks: None Diabetes: No  How often do you need to have someone help you when you read instructions, pamphlets, or other written materials from your doctor or pharmacy?: 1 - Never  Activities of Daily Living    02/06/2023     3:05 PM  In your present state of health, do you have any difficulty performing the following activities:  Hearing? 0  Vision? 0  Comment wears readers  Difficulty concentrating or making decisions? 0  Walking or climbing stairs? 0  Dressing or bathing? 0  Doing errands, shopping? 0  Preparing Food and eating ? N  Using the Toilet? N  In the past six months, have you accidently leaked urine? Y  Do you have problems with loss of bowel control? N  Managing your Medications? N  Managing your Finances? N  Housekeeping or managing your  Housekeeping? N    Patient Care Team: Olive Bass, FNP as PCP - General (Internal Medicine) Manning Charity, OD as Referring Physician (Optometry) Szabat, Vinnie Level, [facility] (Inactive) as Pharmacist (Pharmacist)  Indicate any recent Medical Services you may have received from other than [facility] providers in the past year (date may be approximate).     Assessment:   This is a routine wellness examination for Shevette.  Hearing/Vision screen No results found.  Dietary issues and exercise activities discussed: Current Exercise Habits: Home exercise routine, Type of exercise: walking, Time (Minutes): 20, Frequency (Times/Week): 3, Weekly Exercise (Minutes/Week): 60, Intensity: Mild, Exercise limited by: None identified   Goals Addressed   None    Depression Screen    02/06/2023    3:05 PM 10/09/2022    2:27 PM 08/29/2022   11:06 AM 02/03/2022    3:06 PM 12/23/2021    2:56 PM 04/05/2021    1:18 PM 08/14/2018    5:44 PM  PHQ 2/9 Scores  PHQ - 2 Score 0 0 0 0 0 0 0    Fall Risk    02/06/2023    3:03 PM 10/09/2022    2:27 PM 08/29/2022   11:06 AM 02/03/2022    3:05 PM 12/23/2021    2:55 PM  Fall Risk   Falls in the past year? 1 0 1 0 0  Comment tripped in a walking trail      Number falls in past yr: 0 0 0 0 0  Injury with Fall? 0 0 1 0 0  Risk for fall due to : No Fall Risks No Fall Risks Impaired balance/gait;History of fall(s) No Fall  Risks No Fall Risks  Follow up Falls evaluation completed Falls evaluation completed Falls evaluation completed;Falls prevention discussed Falls evaluation completed Falls evaluation completed    FALL RISK PREVENTION PERTAINING TO THE HOME:  Any stairs in or around the home? No  Home free of loose throw rugs in walkways, pet beds, electrical cords, etc? Yes  Adequate lighting in your home to reduce risk of falls? Yes   ASSISTIVE DEVICES UTILIZED TO PREVENT FALLS:  Life alert? No  Use of a cane, walker or w/c? No  Grab bars in the bathroom? No  Shower chair or bench in shower? No  Elevated toilet seat or a handicapped toilet? Yes   TIMED UP AND GO:  Was the test performed?  No, audio visit .   Cognitive Function:        02/06/2023    3:08 PM 02/03/2022    3:09 PM  6CIT Screen  What Year? 0 points 0 points  What month? 0 points 0 points  What time? 0 points 0 points  Count back from 20 0 points 0 points  Months in reverse 0 points 2 points  Repeat phrase 10 points 10 points  Total Score 10 points 12 points    Immunizations Immunization History  Administered Date(s) Administered   PFIZER(Purple Top)SARS-COV-2 Vaccination 01/01/2020, 01/26/2020, 08/21/2020   Pneumococcal Conjugate-13 09/02/2013   Pneumococcal Polysaccharide-23 06/04/2017   Td 12/24/2001   Tdap 03/05/2020   Zoster Recombinat (Shingrix) 10/30/2020, 06/03/2021    TDAP status: Up to date  Flu Vaccine status: Up to date  Pneumococcal vaccine status: Up to date  Covid-19 vaccine status: Information provided on how to obtain vaccines.   Qualifies for Shingles Vaccine? Yes   Zostavax completed No   Shingrix Completed?: Yes  Screening Tests Health Maintenance  Topic Date Due   COVID-19  Vaccine (4 - 2023-24 season) 06/23/2022   Medicare Annual Wellness (AWV)  02/04/2023   INFLUENZA VACCINE  05/24/2023   COLONOSCOPY (Pts 45-86yrs Insurance coverage will need to be confirmed)  02/08/2026    DTaP/Tdap/Td (3 - Td or Tdap) 03/05/2030   Pneumonia Vaccine 41+ Years old  Completed   DEXA SCAN  Completed   Hepatitis C Screening  Completed   Zoster Vaccines- Shingrix  Completed   HPV VACCINES  Aged Out    Health Maintenance  Health Maintenance Due  Topic Date Due   COVID-19 Vaccine (4 - 2023-24 season) 06/23/2022   Medicare Annual Wellness (AWV)  02/04/2023    Colorectal cancer screening: Type of screening: Colonoscopy. Completed 02/08/21. Repeat every 5 years  Mammogram status: Completed 01/23/22. Repeat every year. Scheduled for 02/21/23.  Bone Density status: Completed 06/09/22. Results reflect: Bone density results: OSTEOPENIA. Repeat every 2 years.  Lung Cancer Screening: (Low Dose CT Chest recommended if Age 76-80 years, 30 pack-year currently smoking OR have quit w/in 15years.) does not qualify.   Additional Screening:  Hepatitis C Screening: does qualify; Completed 06/04/17  Vision Screening: Recommended annual ophthalmology exams for early detection of glaucoma and other disorders of the eye. Is the patient up to date with their annual eye exam?  Yes  Who is the provider or what is the name of the office in which the patient attends annual eye exams? Dr. Manning Charity If pt is not established with a provider, would they like to be referred to a provider to establish care? No .   Dental Screening: Recommended annual dental exams for proper oral hygiene  Community Resource Referral / Chronic Care Management: CRR required this visit?  No   CCM required this visit?  No      Plan:     I have personally reviewed and noted the following in the patient's chart:   Medical and social history Use of alcohol, tobacco or illicit drugs  Current medications and supplements including opioid prescriptions. Patient is not currently taking opioid prescriptions. Functional ability and status Nutritional status Physical activity Advanced directives List of other  physicians Hospitalizations, surgeries, and ER visits in previous 12 months Vitals Screenings to include cognitive, depression, and falls Referrals and appointments  In addition, I have reviewed and discussed with patient certain preventive protocols, quality metrics, and best practice recommendations. A written personalized care plan for preventive services as well as general preventive health recommendations were provided to patient.   Due to this being a telephonic visit, the after visit summary with patients personalized plan was offered to patient via mail or my-chart. Per request, patient was mailed a copy of AVS.   Donne Anon, CMA   02/06/2023   Nurse Notes: None

## 2023-02-21 ENCOUNTER — Ambulatory Visit
Admission: RE | Admit: 2023-02-21 | Discharge: 2023-02-21 | Disposition: A | Discharge status: Home / Self Care | Payer: Medicare (Managed Care) | Source: Ambulatory Visit | Attending: Family | Admitting: Family

## 2023-02-21 DIAGNOSIS — Z1231 Encounter for screening mammogram for malignant neoplasm of breast: Secondary | ICD-10-CM

## 2023-02-23 ENCOUNTER — Other Ambulatory Visit: Payer: Self-pay | Admitting: Family

## 2023-02-23 DIAGNOSIS — R928 Other abnormal and inconclusive findings on diagnostic imaging of breast: Secondary | ICD-10-CM

## 2023-03-06 ENCOUNTER — Other Ambulatory Visit: Payer: Self-pay | Admitting: Family

## 2023-03-06 ENCOUNTER — Ambulatory Visit
Admission: RE | Admit: 2023-03-06 | Discharge: 2023-03-06 | Disposition: A | Discharge status: Home / Self Care | Payer: Medicare (Managed Care) | Source: Ambulatory Visit | Attending: Family | Admitting: Family

## 2023-03-06 DIAGNOSIS — R928 Other abnormal and inconclusive findings on diagnostic imaging of breast: Secondary | ICD-10-CM

## 2023-03-06 DIAGNOSIS — R922 Inconclusive mammogram: Secondary | ICD-10-CM | POA: Diagnosis not present

## 2023-03-06 DIAGNOSIS — R921 Mammographic calcification found on diagnostic imaging of breast: Secondary | ICD-10-CM | POA: Diagnosis not present

## 2023-03-08 ENCOUNTER — Ambulatory Visit
Admission: RE | Admit: 2023-03-08 | Discharge: 2023-03-08 | Disposition: A | Discharge status: Home / Self Care | Payer: Medicare (Managed Care) | Source: Ambulatory Visit | Attending: Family | Admitting: Family

## 2023-03-08 DIAGNOSIS — N6489 Other specified disorders of breast: Secondary | ICD-10-CM | POA: Diagnosis not present

## 2023-03-08 DIAGNOSIS — R928 Other abnormal and inconclusive findings on diagnostic imaging of breast: Secondary | ICD-10-CM

## 2023-03-08 DIAGNOSIS — R921 Mammographic calcification found on diagnostic imaging of breast: Secondary | ICD-10-CM | POA: Diagnosis not present

## 2023-03-08 HISTORY — PX: BREAST BIOPSY: SHX20

## 2023-03-12 ENCOUNTER — Ambulatory Visit (HOSPITAL_COMMUNITY)
Admission: EM | Admit: 2023-03-12 | Discharge: 2023-03-12 | Disposition: A | Discharge status: Home / Self Care | Payer: Medicare (Managed Care) | Attending: Nurse Practitioner | Admitting: Nurse Practitioner

## 2023-03-12 ENCOUNTER — Encounter (HOSPITAL_COMMUNITY): Payer: Self-pay | Admitting: Emergency Medicine

## 2023-03-12 ENCOUNTER — Other Ambulatory Visit: Payer: Self-pay

## 2023-03-12 DIAGNOSIS — R030 Elevated blood-pressure reading, without diagnosis of hypertension: Secondary | ICD-10-CM | POA: Diagnosis not present

## 2023-03-12 DIAGNOSIS — L03114 Cellulitis of left upper limb: Secondary | ICD-10-CM | POA: Diagnosis not present

## 2023-03-12 DIAGNOSIS — T63421A Toxic effect of venom of ants, accidental (unintentional), initial encounter: Secondary | ICD-10-CM

## 2023-03-12 MED ORDER — TRIAMCINOLONE ACETONIDE 0.1 % EX CREA: 1.0000 | TOPICAL_CREAM | Freq: Two times a day (BID) | CUTANEOUS | 0 refills | Status: DC

## 2023-03-12 MED ORDER — CEPHALEXIN 500 MG PO CAPS: 500.0000 mg | ORAL_CAPSULE | Freq: Three times a day (TID) | ORAL | 0 refills | Status: AC

## 2023-03-12 NOTE — Discharge Instructions (Signed)
Cellulitis is a skin infection that is caused by a bacteria that enters through a break in the skin from a bug bite. The infected area is often warm, red, swollen, and sore.   You have been prescribed an antibiotic.  Make sure you finish all the medications even if you start to feel better.  Take over-the-counter Benadryl as needed for itching.  Place cold or warm cloths on the skin to help with the discomfort.   Go to the ED immediately if:  You develop a fever. You feel sick and have muscle aches and weakness. You develop vomiting or watery poop that will not go away. You see red streaks coming from the area. You notice the skin turns purple or black and falls off.  Managing hypertension is very important. Over time, hypertension can damage the arteries and decrease blood flow to parts of the body, including the brain, heart, and kidneys. Having untreated or uncontrolled hypertension can lead to heart attack, stroke, weakened blood vessels (aneurysm), heart failure, kidney damage, eye damage, memory/concentration problems and vascular dementia. Please monitor your blood pressure closely. Follow-up with your primary care provider in 3 days for recheck of your blood pressure. Go to the ED immediately if you develop a severe headache, dizziness, sudden vision problems, confusion, experience unusual weakness or numbness, severe pain in your chest or abdomen, you vomit repeatedly or have trouble breathing.

## 2023-03-12 NOTE — ED Provider Notes (Signed)
[facility]    CSN: [id number] Arrival date & time: 03/12/23  1310      History   Chief Complaint No chief complaint on file.   HPI Mia Newman is a 77 y.o. female.   Subjective:  Mia Newman is a 77 y.o. female that presents for evaluation of fire ant bites to the left wrist/forearm area. Patient was pulling weeds in her yard one day ago when she was stung.  She has no prior reactions to a fire ant sting.  Patient reports increased redness, swelling and warmth of the affected area. She also has several small white pustules where the actual sting occurred. The area causes itching and burning. She denies any fevers, chills, body aches, headaches, nausea or vomiting. Treatment included Benadryl, rubbing alcohol and Cortizone cream with minimal relief in symptoms.   The following portions of the patient's history were reviewed and updated as appropriate: allergies, current medications, past family history, past medical history, past social history, past surgical history, and problem list.       Past Medical History:  Diagnosis Date   Allergy    Arthritis    ASYMPTOMATIC POSTMENOPAUSAL STATUS 03/03/2010   DIVERTICULITIS-COLON 05/22/2008   DIVERTICULOSIS, COLON 05/20/2008   DM 04/22/2008   borderline   Glaucoma    Hyperthyroidism    Irritable bowel syndrome    Personal history of colonic polyp - adenoma 03/03/2014   Pure hypercholesterolemia 09/27/2007   Unspecified hypothyroidism 04/22/2008    Patient Active Problem List   Diagnosis Date Noted   Periumbilical abdominal tenderness without rebound tenderness 07/24/2019   Diverticulitis of large intestine without perforation or abscess without bleeding 07/24/2019   Hyperglycemia 08/07/2018   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   Left rotator cuff tear 06/19/2017   Vaginitis and vulvovaginitis 06/09/2014   Meningioma (HCC) 09/02/2013   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      Home Medications    Prior to Admission medications   Medication Sig Start Date End Date Taking? Authorizing Provider  cephALEXin (KEFLEX) 500 MG capsule Take 1 capsule (500 mg total) by mouth 3 (three) times daily for 7 days. 03/12/23 03/19/23 Yes Lurline Idol, FNP  triamcinolone cream (KENALOG) 0.1 % Apply 1 Application topically 2 (two) times daily. Apply to affected area two times a day 03/12/23  Yes Thurmond Hildebran, [place], FNP  Ascorbic Acid (VITAMIN C) 1000 MG tablet Take 1,000 mg by mouth daily.    [provider]  calcium carbonate (OSCAL) 1500 (600 Ca) MG TABS tablet Take 600 mg of elemental calcium by mouth 2 (two) times daily with a meal.    [provider]  Cholecalciferol (VITAMIN D-3) 125 MCG (5000 UT) TABS Take by mouth.    [provider]  ezetimibe (ZETIA) 10 MG tablet Take 1 tablet (10 mg total) by mouth daily. 12/29/21   Ria Clock  Margarita Grizzle, FNP  IBUPROFEN PO Take by mouth.    [provider]  latanoprost (XALATAN) 0.005 % ophthalmic solution Place 1 drop into both eyes at bedtime.    [provider]  levothyroxine (SYNTHROID) 75 MCG tablet Take 1 tablet (75 mcg total) by mouth daily before breakfast. 01/15/23   Olive Bass, FNP  predniSONE (DELTASONE) 20 MG tablet Take 2 tablets (40 mg total) by mouth daily. Patient not taking: Reported on 03/12/2023 01/30/23   Valinda Hoar, NP    Family  History Family History  Problem Relation Age of Onset   Hypertension Mother    Heart attack Father 3   Cancer Maternal Grandmother        Uterine Cancer   Stomach cancer Paternal Grandmother 50   Colon cancer Neg Hx    Esophageal cancer Neg Hx     Social History Social History   Tobacco Use   Smoking status: Never   Smokeless tobacco: Never  Vaping Use   Vaping Use: Never used  Substance Use Topics   Alcohol use: No   Drug use: No     Allergies   Atorvastatin, Raloxifene, Simvastatin, and Sulfamethoxazole   Review of Systems Review of Systems  Constitutional:  Negative for fever.  HENT:  Negative for sore throat and trouble swallowing.   Gastrointestinal:  Negative for nausea and vomiting.  Musculoskeletal:  Negative for myalgias.  Skin:  Positive for wound.  Neurological:  Negative for dizziness and headaches.  All other systems reviewed and are negative.    Physical Exam Triage Vital Signs ED Triage Vitals  Enc Vitals Group     BP 03/12/23 1512 (!) 173/83     Pulse Rate 03/12/23 1512 63     Resp --      Temp 03/12/23 1512 97.6 F (36.4 C)     Temp Source 03/12/23 1512 Oral     SpO2 03/12/23 1512 98 %     Weight --      Height --      Head Circumference --      Peak Flow --      Pain Score 03/12/23 1509 6     Pain Loc --      Pain Edu? --      Excl. in GC? --    No data found.  Updated Vital Signs BP (!) 173/83 (BP Location: Right Arm)   Pulse 63   Temp 97.6 F (36.4 C) (Oral)   SpO2 98%   Visual Acuity Right Eye Distance:   Left Eye Distance:   Bilateral Distance:    Right Eye Near:   Left Eye Near:    Bilateral Near:     Physical Exam Vitals reviewed.  Constitutional:      General: She is not in acute distress.    Appearance: Normal appearance. She is not ill-appearing, toxic-appearing or diaphoretic.  HENT:     Head: Normocephalic.  Cardiovascular:     Rate and Rhythm: Normal rate.  Pulmonary:     Effort: Pulmonary effort  is normal.  Musculoskeletal:        General: Normal range of motion.     Cervical back: Normal range of motion and neck supple.  Skin:    General: Skin is warm.     Comments: Warmth, swelling and erythema noted to the left wrist that extends up into the forearm. Areas of erythema is circumferential. There are several small white pustules noted within the affected area as  well. No streaking or drainage noted. See pictures below.   Neurological:     General: No focal deficit present.     Mental Status: She is alert and oriented to person, place, and time.         UC Treatments / Results  Labs (all labs ordered are listed, but only abnormal results are displayed) Labs Reviewed - No data to display  EKG   Radiology No results found.  Procedures Procedures (including critical care time)  Medications Ordered in UC Medications - No data to display  Initial Impression / Assessment and Plan / UC Course  I have reviewed the triage vital signs and the nursing notes.  Pertinent labs & imaging results that were available during my care of the patient were reviewed by me and considered in my medical decision making (see chart for details).    77 yo female presenting with warmth, redness and swelling to the left wrist and forearm area after being stung by fire ants 1 day ago. Patient is alert and oriented x 3.  Afebrile.  Nontoxic.  Physical exam as above.  Will treat for cellulitis with cephalexin.  Supportive care, monitoring parameters and ED precautions reviewed.  Patient was also noted to have an elevated blood pressure 173/83.  She denies any symptoms.  Denies any prior history of hypertension.  No current evidence of hypertensive urgency or hypertensive emergency.  Advised patient to follow-up with PCP in 3 days for BP recheck.    Today's evaluation has revealed no signs of a dangerous process. Discussed diagnosis with patient and/or guardian. Patient and/or guardian aware of their  diagnosis, possible red flag symptoms to watch out for and need for close follow up. Patient and/or guardian understands verbal and written discharge instructions. Patient and/or guardian comfortable with plan and disposition.  Patient and/or guardian has a clear mental status at this time, good insight into illness (after discussion and teaching) and has clear judgment to make decisions regarding their care  Documentation was completed with the aid of voice recognition software. Transcription may contain typographical errors. Final Clinical Impressions(s) / UC Diagnoses   Final diagnoses:  Cellulitis of left upper extremity  Fire ant bite, accidental or unintentional, initial encounter  Elevated blood pressure reading in office without diagnosis of hypertension     Discharge Instructions      Cellulitis is a skin infection that is caused by a bacteria that enters through a break in the skin from a bug bite. The infected area is often warm, red, swollen, and sore.   You have been prescribed an antibiotic.  Make sure you finish all the medications even if you start to feel better.  Take over-the-counter Benadryl as needed for itching.  Place cold or warm cloths on the skin to help with the discomfort.   Go to the ED immediately if:  You develop a fever. You feel sick and have muscle aches and weakness. You develop vomiting or watery poop that will not go away. You see red streaks coming from the area. You notice the skin turns purple or black and falls off.  Managing hypertension is very important. Over time, hypertension can damage the arteries and decrease blood flow to parts of the body, including the brain, heart, and kidneys. Having untreated or uncontrolled hypertension can lead to heart attack, stroke, weakened blood vessels (aneurysm), heart failure, kidney damage, eye damage, memory/concentration problems and vascular dementia. Please monitor your blood pressure closely.  Follow-up with your primary  care provider in 3 days for recheck of your blood pressure. Go to the ED immediately if you develop a severe headache, dizziness, sudden vision problems, confusion, experience unusual weakness or numbness, severe pain in your chest or abdomen, you vomit repeatedly or have trouble breathing.       ED Prescriptions     Medication Sig Dispense Auth. Provider   cephALEXin (KEFLEX) 500 MG capsule Take 1 capsule (500 mg total) by mouth 3 (three) times daily for 7 days. 21 capsule Lurline Idol, FNP   triamcinolone cream (KENALOG) 0.1 % Apply 1 Application topically 2 (two) times daily. Apply to affected area two times a day 15 g Lurline Idol, FNP      PDMP not reviewed this encounter.   Lurline Idol, [place] 03/12/23 [phone]

## 2023-03-12 NOTE — ED Triage Notes (Signed)
Patient reports a fire ant exposure /bite occurred yesterday afternoon.  Patient has stinging and burning to left forearm.  Patient has several white topped bumps in a large red area.    Has had benadryl, cortizone 10, and put alcohol on wound

## 2023-04-06 ENCOUNTER — Ambulatory Visit (HOSPITAL_COMMUNITY)
Admission: EM | Admit: 2023-04-06 | Discharge: 2023-04-06 | Disposition: A | Discharge status: Home / Self Care | Payer: Medicare (Managed Care) | Attending: Emergency Medicine | Admitting: Emergency Medicine

## 2023-04-06 ENCOUNTER — Encounter (HOSPITAL_COMMUNITY): Payer: Self-pay | Admitting: Emergency Medicine

## 2023-04-06 DIAGNOSIS — W57XXXA Bitten or stung by nonvenomous insect and other nonvenomous arthropods, initial encounter: Secondary | ICD-10-CM | POA: Diagnosis not present

## 2023-04-06 DIAGNOSIS — S60562A Insect bite (nonvenomous) of left hand, initial encounter: Secondary | ICD-10-CM

## 2023-04-06 DIAGNOSIS — L03114 Cellulitis of left upper limb: Secondary | ICD-10-CM | POA: Diagnosis not present

## 2023-04-06 MED ORDER — TRIAMCINOLONE ACETONIDE 0.1 % EX CREA: 1.0000 | TOPICAL_CREAM | Freq: Two times a day (BID) | CUTANEOUS | 0 refills | Status: DC

## 2023-04-06 MED ORDER — CEPHALEXIN 500 MG PO CAPS: 500.0000 mg | ORAL_CAPSULE | Freq: Three times a day (TID) | ORAL | 0 refills | Status: AC

## 2023-04-06 NOTE — ED Triage Notes (Signed)
Pt reports was pulling up solar lights at church yesterday when bit by ants. Reports has an allergy to them. Left hand is worse than right. Has erythema, itching and burning. Used cream on areas that was prescribed back in May.

## 2023-04-06 NOTE — ED Provider Notes (Signed)
[facility]    CSN: [id number] Arrival date & time: 04/06/23  1039      History   Chief Complaint Chief Complaint  Patient presents with   Insect Bite    HPI Mia Newman is a 77 y.o. female.   Patient presents to clinic for concerns of fire ant bite to her right and left hands.  Reports she was changing out some solar lights at church the day before yesterday when she felt bites on her hands.  After she placed the solar light, she noticed that there was a bunch of fire aunts at the site.  She does have an allergy to them.  She has used the previously prescribed triamcinolone cream without much relief.  Does have Benadryl at home, has not yet taken any because it makes her drowsy.   The history is provided by the patient and medical records.    Past Medical History:  Diagnosis Date   Allergy    Arthritis    ASYMPTOMATIC POSTMENOPAUSAL STATUS 03/03/2010   DIVERTICULITIS-COLON 05/22/2008   DIVERTICULOSIS, COLON 05/20/2008   DM 04/22/2008   borderline   Glaucoma    Hyperthyroidism    Irritable bowel syndrome    Personal history of colonic polyp - adenoma 03/03/2014   Pure hypercholesterolemia 09/27/2007   Unspecified hypothyroidism 04/22/2008    Patient Active Problem List   Diagnosis Date Noted   Periumbilical abdominal tenderness without rebound tenderness 07/24/2019   Diverticulitis of large intestine without perforation or abscess without bleeding 07/24/2019   Hyperglycemia 08/07/2018   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   Left rotator cuff tear 06/19/2017   Vaginitis and vulvovaginitis 06/09/2014   Meningioma (HCC) 09/02/2013   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      Home Medications    Prior to Admission medications   Medication Sig Start Date End Date Taking? Authorizing Provider  cephALEXin (KEFLEX) 500 MG capsule Take 1 capsule (500 mg total) by mouth 3 (three) times daily for 7 days. 04/06/23 04/13/23 Yes Rinaldo Ratel, [place], FNP  Ascorbic Acid (VITAMIN C) 1000 MG tablet Take 1,000 mg by mouth daily.    [provider]  calcium carbonate (OSCAL) 1500 (600 Ca) MG TABS tablet Take 600 mg of elemental calcium by mouth 2 (two) times daily with a meal.    [provider]  Cholecalciferol (VITAMIN D-3) 125 MCG (5000 UT) TABS Take by mouth.    [provider]  ezetimibe (ZETIA) 10 MG tablet Take 1 tablet (10 mg total) by mouth daily. 12/29/21   Olive Bass, FNP  IBUPROFEN PO Take by mouth.    [provider]  latanoprost (XALATAN) 0.005 % ophthalmic solution Place 1 drop into both eyes at bedtime.    [provider]  levothyroxine (SYNTHROID) 75 MCG tablet Take 1 tablet (75 mcg total) by mouth daily before breakfast. 01/15/23  Olive Bass, FNP  predniSONE (DELTASONE) 20 MG tablet Take 2 tablets (40 mg total) by mouth daily. Patient not taking: Reported on 03/12/2023 01/30/23   Valinda Hoar, NP  triamcinolone cream (KENALOG) 0.1 % Apply 1 Application topically 2 (two) times daily. Apply to affected area two times a day 04/06/23   Cinderella Christoffersen, [place], FNP    Family History Family History  Problem Relation Age of Onset   Hypertension Mother    Heart attack Father 24   Cancer Maternal Grandmother        Uterine Cancer   Stomach cancer Paternal Grandmother 51   Colon cancer Neg Hx    Esophageal cancer Neg Hx      Social History Social History   Tobacco Use   Smoking status: Never   Smokeless tobacco: Never  Vaping Use   Vaping Use: Never used  Substance Use Topics   Alcohol use: No   Drug use: No     Allergies   Atorvastatin, Raloxifene, Simvastatin, and Sulfamethoxazole   Review of Systems Review of Systems  Skin:  Positive for rash.     Physical Exam Triage Vital Signs ED Triage Vitals  Enc Vitals Group     BP 04/06/23 1056 (!) 154/82     Pulse Rate 04/06/23 1056 62     Resp 04/06/23 1056 17     Temp 04/06/23 1056 97.6 F (36.4 C)     Temp Source 04/06/23 1056 Oral     SpO2 04/06/23 1056 97 %     Weight --      Height --      Head Circumference --      Peak Flow --      Pain Score 04/06/23 1055 2     Pain Loc --      Pain Edu? --      Excl. in GC? --    No data found.  Updated Vital Signs BP (!) 154/82 (BP Location: Right Arm)   Pulse 62   Temp 97.6 F (36.4 C) (Oral)   Resp 17   SpO2 97%   Visual Acuity Right Eye Distance:   Left Eye Distance:   Bilateral Distance:    Right Eye Near:   Left Eye Near:    Bilateral Near:     Physical Exam Vitals and nursing note reviewed.  Constitutional:      Appearance: Normal appearance.  HENT:     Head: Normocephalic and atraumatic.     Right Ear: External ear normal.     Left Ear: External ear normal.     Nose: Nose normal.     Mouth/Throat:     Mouth: Mucous membranes are moist.  Eyes:     Conjunctiva/sclera: Conjunctivae normal.  Cardiovascular:     Rate and Rhythm: Normal rate.     Pulses: Normal pulses.  Pulmonary:     Effort: Pulmonary effort is normal.  Skin:    General: Skin is warm and dry.     Capillary Refill: Capillary refill takes less than 2 seconds.     Findings: Erythema present.     Comments: See media.   Neurological:     General: No focal deficit present.     Mental Status: She is alert.  Psychiatric:        Mood and Affect: Mood normal.        Behavior: Behavior is  cooperative.      UC Treatments / Results  Labs (all labs  ordered are listed, but only abnormal results are displayed) Labs Reviewed - No data to display  EKG   Radiology No results found.  Procedures Procedures (including critical care time)  Medications Ordered in UC Medications - No data to display  Initial Impression / Assessment and Plan / UC Course  I have reviewed the triage vital signs and the nursing notes.  Pertinent labs & imaging results that were available during my care of the patient were reviewed by me and considered in my medical decision making (see chart for details).  Vitals and triage reviewed, patient is hemodynamically stable.  Has multiple bites to her hands, her left hand being warm, red and swollen.  Will cover with Keflex to prevent against cellulitis and refill triamcinolone ointment.  Advised protective precautions against bites in the future.  Plan of care, follow-up care and return precautions given, no questions at this time.     Final Clinical Impressions(s) / UC Diagnoses   Final diagnoses:  Insect bite of left hand, initial encounter  Cellulitis of left upper extremity     Discharge Instructions      Please use the triamcinolone ointment up to twice daily for your itching.  You can also take 25 mg of Benadryl every 6-8 hours, this may be sedating.  I am also covering him with oral antibiotics, as it appears that the bite on your left hand is infected.  Please take all antibiotics as prescribed until finished, you take it with food to help prevent gastrointestinal upset.  When working outside, please ensure you are wearing gloves and avoiding your triggers, such as fire ants.  Please return to clinic for any new or concerning symptoms.     ED Prescriptions     Medication Sig Dispense Auth. Provider   triamcinolone cream (KENALOG) 0.1 % Apply 1 Application topically 2 (two) times daily. Apply to affected area two times a day 15 g  Rinaldo Ratel, [place], [place]   cephALEXin (KEFLEX) 500 MG capsule Take 1 capsule (500 mg total) by mouth 3 (three) times daily for 7 days. 20 capsule Alexandre Lightsey, [place] N, [place]      PDMP not reviewed this encounter.   Mekiah Cambridge, [place] N, [place] 04/06/23 1122

## 2023-04-06 NOTE — Discharge Instructions (Addendum)
Please use the triamcinolone ointment up to twice daily for your itching.  You can also take 25 mg of Benadryl every 6-8 hours, this may be sedating.  I am also covering him with oral antibiotics, as it appears that the bite on your left hand is infected.  Please take all antibiotics as prescribed until finished, you take it with food to help prevent gastrointestinal upset.  When working outside, please ensure you are wearing gloves and avoiding your triggers, such as fire ants.  Please return to clinic for any new or concerning symptoms.

## 2023-04-17 ENCOUNTER — Other Ambulatory Visit: Payer: Self-pay | Admitting: Family

## 2023-04-19 ENCOUNTER — Ambulatory Visit: Payer: Medicare (Managed Care) | Attending: Internal Medicine | Admitting: Internal Medicine

## 2023-04-19 ENCOUNTER — Encounter: Payer: Self-pay | Admitting: Internal Medicine

## 2023-04-19 VITALS — BP 148/78 | HR 73 | Ht 61.0 in | Wt 138.0 lb

## 2023-04-19 DIAGNOSIS — E7800 Pure hypercholesterolemia, unspecified: Secondary | ICD-10-CM | POA: Diagnosis not present

## 2023-04-19 DIAGNOSIS — R9431 Abnormal electrocardiogram [ECG] [EKG]: Secondary | ICD-10-CM

## 2023-04-19 DIAGNOSIS — Z79899 Other long term (current) drug therapy: Secondary | ICD-10-CM

## 2023-04-19 DIAGNOSIS — I10 Essential (primary) hypertension: Secondary | ICD-10-CM | POA: Diagnosis not present

## 2023-04-19 MED ORDER — AMLODIPINE BESYLATE 2.5 MG PO TABS: 2.5000 mg | ORAL_TABLET | Freq: Every day | ORAL | 3 refills | Status: DC

## 2023-04-19 MED ORDER — ROSUVASTATIN CALCIUM 5 MG PO TABS: 5.0000 mg | ORAL_TABLET | ORAL | 3 refills | Status: DC

## 2023-04-19 NOTE — Patient Instructions (Addendum)
Medication Instructions:  Start amlodipine 2.5 mg once a day. Start Crestor 5 mg 3 times a week.  *If you need a refill on your cardiac medications before your next appointment, please call your pharmacy*   Lab Work: Lipid, HFP, NMR in 8 weeks. If you have labs (blood work) drawn today and your tests are completely normal, you will receive your results only by: MyChart Message (if you have MyChart) OR A paper copy in the mail If you have any lab test that is abnormal or we need to change your treatment, we will call you to review the results.   Testing/Procedures: Your physician has requested that you have an echocardiogram. Echocardiography is a painless test that uses sound waves to create images of your heart. It provides your doctor with information about the size and shape of your heart and how well your heart's chambers and valves are working. This procedure takes approximately one hour. There are no restrictions for this procedure. Please do NOT wear cologne, perfume, aftershave, or lotions (deodorant is allowed). Please arrive 15 minutes prior to your appointment time.    Follow-Up: At [facility], you and your health needs are our priority.  As part of our continuing mission to provide you with exceptional heart care, we have created designated Provider Care Teams.  These Care Teams include your primary Cardiologist (physician) and Advanced Practice Providers (APPs -  Physician Assistants and Nurse Practitioners) who all work together to provide you with the care you need, when you need it.  We recommend signing up for the patient portal called "MyChart".  Sign up information is provided on this After Visit Summary.  MyChart is used to connect with patients for Virtual Visits (Telemedicine).  Patients are able to view lab/test results, encounter notes, upcoming appointments, etc.  Non-urgent messages can be sent to your provider as well.   To learn more about what you can  do with MyChart, go to [email or web address].    Your next appointment:   6 week(s)  Provider:   Dietrich Pates, MD

## 2023-04-19 NOTE — Progress Notes (Signed)
Cardiology Office Note   Date:  04/19/2023   ID:  Mia Newman, DOB Feb 14, 1946, MRN [id number]  PCP:  Olive Bass, FNP  Cardiologist:   Dietrich Pates, MD       History of Present Illness: Mia Newman is a 77 y.o. female with a history of chest pain  Seen in ER on 02/05/23  CP  occurred while watching TV   Had episode a few days prior whle sitting BP at that time was 180/ In ER, her BP was elevated at 227/,  197/84  EKG  in ER showed SR   LVH   Poor R wave progression   Since home the pt says her BP has been 130s to 140s  /60 to 75     The pt has not had any more CP, even with activity   Current Meds  Medication Sig   Ascorbic Acid (VITAMIN C) 1000 MG tablet Take 1,000 mg by mouth daily.   calcium carbonate (OSCAL) 1500 (600 Ca) MG TABS tablet Take 600 mg of elemental calcium by mouth 2 (two) times daily with a meal.   Cholecalciferol (VITAMIN D-3) 125 MCG (5000 UT) TABS Take by mouth.   IBUPROFEN PO Take by mouth.   latanoprost (XALATAN) 0.005 % ophthalmic solution Place 1 drop into both eyes at bedtime.   levothyroxine (SYNTHROID) 75 MCG tablet Take 1 tablet (75 mcg total) by mouth daily before breakfast.   predniSONE (DELTASONE) 20 MG tablet Take 2 tablets (40 mg total) by mouth daily.   triamcinolone cream (KENALOG) 0.1 % Apply 1 Application topically 2 (two) times daily. Apply to affected area two times a day     Allergies:   Atorvastatin, Raloxifene, Simvastatin, and Sulfamethoxazole   Past Medical History:  Diagnosis Date   Allergy    Arthritis    ASYMPTOMATIC POSTMENOPAUSAL STATUS 03/03/2010   DIVERTICULITIS-COLON 05/22/2008   DIVERTICULOSIS, COLON 05/20/2008   DM 04/22/2008   borderline   Glaucoma    Hyperthyroidism    Irritable bowel syndrome    Personal history of colonic polyp - adenoma 03/03/2014   Pure hypercholesterolemia 09/27/2007   Unspecified hypothyroidism 04/22/2008    Past Surgical History:  Procedure Laterality Date   ABDOMINAL  HYSTERECTOMY  1987 apx   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 She has never used smokeless tobacco. She reports that she does not drink alcohol and does not use drugs.   Family History:  The patient's family history includes Cancer in her maternal grandmother; Heart attack (age of onset: 90) in her father; Hypertension in her mother; Stomach cancer (age of onset: 53) in her paternal grandmother.    ROS:  Please see the history of present illness. All other systems are reviewed and  Negative to the above problem except as noted.    PHYSICAL EXAM: VS:  BP (!) 148/78   Pulse 73   Ht 5\' 1"  (1.549 m)   Wt 138 lb (62.6 kg)   SpO2 97%   BMI 26.07 kg/m   GEN: Well nourished, well developed, in no acute distress  HEENT: normal  Neck: no JVD, no carotid bruit Cardiac: RRR; no murmur  No LE edema  Respiratory:  clear to auscultation  GI: soft, nontender   No hepatomegaly   EKG:  EKG is  not ordered today.   Lipid Panel    Component Value Date/Time   CHOL 277 (H) 02/06/2022 1414   TRIG 171.0 (H) 02/06/2022 1414   HDL 46.70 02/06/2022 1414   CHOLHDL 6 02/06/2022 1414   VLDL 34.2 02/06/2022 1414   LDLCALC 196 (H) 02/06/2022 1414   LDLCALC 213 (H) 12/23/2021 1552   LDLDIRECT 209.0 08/07/2018 1401      Wt Readings from Last 3 Encounters:  04/19/23 138 lb (62.6 kg)  02/06/23 132 lb (59.9 kg)  01/07/23 140 lb (63.5 kg)      ASSESSMENT AND PLAN:  1 Chest pressure  Pt had chest pressure on day of ER visit  BP severely elevated    None since   I think may be related to BP   Follow for now   I am not convinced of active ischemi  2  HTN  BP still elevated though not as bad as when in  ER    I would add amlodpine 2.5 mg to regimen   Follow  With LVH on EKG would set up for echo     3 Atherosclerosis   The pt has atherosclerosis of aorta on previous CT   Rx risk factors  4  Profound HL  LDL 190s/   Did not tolerate lipitor or Zocor   Will try Crestor 5 mg 3x per week  FOllow up lipomed and liver panel in 8 wks       Current medicines are reviewed at length with the patient today.  The patient does not have concerns regarding medicines.  Signed, Dietrich Pates, MD  04/19/2023 3:39 PM    [facility] [address] [place], [place], [place]  [address] Phone: [phone]; Fax: [phone]

## 2023-05-17 ENCOUNTER — Ambulatory Visit (HOSPITAL_COMMUNITY): Payer: Medicare (Managed Care) | Attending: Cardiovascular Disease

## 2023-05-17 DIAGNOSIS — I10 Essential (primary) hypertension: Secondary | ICD-10-CM | POA: Insufficient documentation

## 2023-05-17 DIAGNOSIS — R9431 Abnormal electrocardiogram [ECG] [EKG]: Secondary | ICD-10-CM | POA: Diagnosis not present

## 2023-05-17 LAB — ECHOCARDIOGRAM COMPLETE
Area-P 1/2: 3.34 cm2
S' Lateral: 2.6 cm

## 2023-05-18 ENCOUNTER — Telehealth: Payer: Self-pay | Admitting: Internal Medicine

## 2023-05-18 NOTE — Telephone Encounter (Signed)
-----   Message from [place] sent at 05/17/2023  6:30 PM EDT ----- Echo is normal  LV size, thckeness, function normal Normal valve function.

## 2023-05-18 NOTE — Telephone Encounter (Signed)
Patient returned RN's call. 

## 2023-05-18 NOTE — Telephone Encounter (Signed)
The patient has been notified of the result and verbalized understanding.  All questions (if any) were answered. Frutoso Schatz, RN 05/18/2023 9:42 AM

## 2023-05-24 ENCOUNTER — Ambulatory Visit (INDEPENDENT_AMBULATORY_CARE_PROVIDER_SITE_OTHER): Payer: Medicare (Managed Care) | Admitting: Family

## 2023-05-24 ENCOUNTER — Encounter: Payer: Self-pay | Admitting: Family

## 2023-05-24 VITALS — BP 136/72 | HR 58 | Ht 61.0 in | Wt 137.8 lb

## 2023-05-24 DIAGNOSIS — R0789 Other chest pain: Secondary | ICD-10-CM | POA: Diagnosis not present

## 2023-05-24 DIAGNOSIS — E890 Postprocedural hypothyroidism: Secondary | ICD-10-CM | POA: Diagnosis not present

## 2023-05-24 MED ORDER — LEVOTHYROXINE SODIUM 75 MCG PO TABS: 75.0000 ug | ORAL_TABLET | Freq: Every day | ORAL | 0 refills | Status: DC

## 2023-05-24 MED ORDER — FAMOTIDINE 20 MG PO TABS: 20.0000 mg | ORAL_TABLET | Freq: Two times a day (BID) | ORAL | Status: AC

## 2023-05-24 NOTE — Progress Notes (Signed)
Mia Newman is a 77 y.o. 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 90 tablet 3   Ascorbic Acid (VITAMIN C) 1000 MG tablet Take 1,000 mg by mouth daily.     calcium carbonate (OSCAL) 1500 (600 Ca) MG TABS tablet Take 600 mg of elemental calcium by mouth 2 (two) times daily with a meal.     Cholecalciferol (VITAMIN D-3) 125 MCG (5000 UT) TABS Take by mouth.     IBUPROFEN PO Take by mouth.     latanoprost (XALATAN) 0.005 % ophthalmic solution Place 1 drop into both eyes at bedtime.     rosuvastatin (CRESTOR) 5 MG tablet Take 1 tablet (5 mg total) by mouth 3 (three) times a week. 40 tablet 3   triamcinolone cream (KENALOG) 0.1 % Apply 1 Application topically 2 (two) times daily. Apply to affected area two times a day 15 g 0   levothyroxine (SYNTHROID) 75 MCG tablet Take 1 tablet (75 mcg total) by mouth daily before breakfast. 30 tablet 0   No current facility-administered medications for this visit.    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 ([facility])   BREAST BIOPSY Left 03/08/2023   MM LT BREAST BX W LOC DEV 1ST LESION IMAGE BX SPEC STEREO GUIDE 03/08/2023 [facility]   BREAST SURGERY     COLONOSCOPY     TUBAL LIGATION     VSD REPAIR  1996    Family History  Problem Relation Age of Onset   Hypertension Mother    Heart attack Father 33   Cancer Maternal Grandmother        Uterine Cancer   Stomach cancer Paternal Grandmother 46   Colon cancer Neg Hx    Esophageal cancer Neg Hx     Social History   Tobacco Use   Smoking status: Never   Smokeless tobacco: Never  Substance Use Topics   Alcohol use: No    Subjective:   Presents for yearly follow up on hypothyroidism; notes she has been out of her medication for the past 5 days; Since last OV, she has been seen and evaluated by cardiology; has been started on Amlodipine and Crestor; had normal cardiac echocardiogram;  She does mention a sensation of intermittent "burning" in her chest  that has been present for months; no chest pain on exertion; she denies any increased burping or belching; she is due to see cardiology in follow up in the next 2 weeks;   Objective:  Vitals:   05/24/23 1427  BP: 136/72  Pulse: (!) 58  SpO2: 96%  Weight: 137 lb 12.8 oz (62.5 kg)  Height: 5\' 1"  (1.549 m)    General: Well developed, well nourished, in no acute distress  Skin : Warm and dry.  Head: Normocephalic and atraumatic  Eyes: Sclera and conjunctiva clear; pupils round and reactive to light; extraocular movements intact  Ears: External normal; canals clear; tympanic membranes normal   Oropharynx: Pink, supple. No suspicious lesions  Neck: Supple without thyromegaly, adenopathy  Lungs: Respirations unlabored; clear to auscultation bilaterally without wheeze, rales, rhonchi  CVS exam: normal rate and regular rhythm.  Neurologic: Alert and oriented; speech intact; face symmetrical; moves all extremities well; CNII-XII intact without focal deficit   Assessment:  1. Hypothyroidism following radioiodine therapy   2. Atypical chest pain     Plan:  Rx updated for Synthroid; patient will plan to get her TSH re-check in about 1 month to ensure accurate results since she has not been taking medication regularly;  Check CBC, CMP, D-dimer; patient is scheduled to see her cardiologist on 8/14 and she understands to discuss these symptoms there; in the interim, she wants to try OTC medication for reflux first- can try OTC Pepcid to use as needed;   No follow-ups on file.  Orders Placed This Encounter  Procedures   TSH    Standing Status:   Future    Standing Expiration Date:   05/23/2024   D-Dimer, Quantitative   CBC with Differential/Platelet   Comp Met (CMET)    Requested Prescriptions   Signed Prescriptions Disp Refills   levothyroxine (SYNTHROID) 75 MCG tablet 30 tablet 0    Sig: Take 1 tablet (75 mcg total) by mouth daily before breakfast.

## 2023-05-24 NOTE — Patient Instructions (Signed)
Please plan to go to [place] [facility] ( [address] [facility]) in early September to get your labs re-checked; they are open from 7:30 am- 5:30 pm- no appointment needed; you don't need to fast;

## 2023-06-03 NOTE — Progress Notes (Unsigned)
Cardiology Office Note   Date:  06/06/2023   ID:  Mathews Argyle, DOB 1946/05/26, MRN [id number]  PCP:  Olive Bass, FNP  Cardiologist:   Dietrich Pates, MD   Patient presents for follow up of HTN      History of Present Illness: Mia Newman is a 77 y.o. female with a history of chest pain  Seen in ER on 02/05/23  CP  occurred while watching TV   Had episode a few days prior whle sitting BP at that time was 180/ In ER, her BP was elevated at 227/,  197/84  EKG  in ER showed SR   LVH   Poor R wave progression    I saw the pt in clinic in June 2024  Her BP was elevated   I recomm adding amlodipine to regimen  With atheroscleroisis on CT scan recomm Crestor 3x per week  (intolerant to simvistatin)  Since seen she has had some burning in chest that she feels is reflux   No CP with activity    Breathing is OK   Current Meds  Medication Sig   amLODipine (NORVASC) 2.5 MG tablet Take 1 tablet (2.5 mg total) by mouth daily.   Ascorbic Acid (VITAMIN C) 1000 MG tablet Take 1,000 mg by mouth daily.   calcium carbonate (OSCAL) 1500 (600 Ca) MG TABS tablet Take 600 mg of elemental calcium by mouth 2 (two) times daily with a meal.   Cholecalciferol (VITAMIN D-3) 125 MCG (5000 UT) TABS Take by mouth.   famotidine (PEPCID) 20 MG tablet Take 1 tablet (20 mg total) by mouth 2 (two) times daily.   IBUPROFEN PO Take by mouth.   latanoprost (XALATAN) 0.005 % ophthalmic solution Place 1 drop into both eyes at bedtime.   levothyroxine (SYNTHROID) 75 MCG tablet Take 1 tablet (75 mcg total) by mouth daily before breakfast.   rosuvastatin (CRESTOR) 5 MG tablet Take 1 tablet (5 mg total) by mouth 3 (three) times a week.   triamcinolone cream (KENALOG) 0.1 % Apply 1 Application topically 2 (two) times daily. Apply to affected area two times a day     Allergies:   Atorvastatin, Raloxifene, Simvastatin, and Sulfamethoxazole   Past Medical History:  Diagnosis Date   Allergy    Arthritis     ASYMPTOMATIC POSTMENOPAUSAL STATUS 03/03/2010   DIVERTICULITIS-COLON 05/22/2008   DIVERTICULOSIS, COLON 05/20/2008   DM 04/22/2008   borderline   Glaucoma    Hyperthyroidism    Irritable bowel syndrome    Personal history of colonic polyp - adenoma 03/03/2014   Pure hypercholesterolemia 09/27/2007   Unspecified hypothyroidism 04/22/2008    Past Surgical History:  Procedure Laterality Date   ABDOMINAL HYSTERECTOMY  1987 apx   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 She has never used smokeless tobacco. She reports that she does not drink alcohol and does not use drugs.   Family History:  The patient's family history includes Cancer in her maternal grandmother; Heart attack (age of onset: 62) in her father; Hypertension in her mother; Stomach cancer (age of onset:  39) in her paternal grandmother.    ROS:  Please see the history of present illness. All other systems are reviewed and  Negative to the above problem except as noted.    PHYSICAL EXAM: VS:  BP 122/82   Pulse 70   Ht 5\' 1"  (1.549 m)   Wt 139 lb 9.6 oz (63.3 kg)   SpO2 97%   BMI 26.38 kg/m   GEN: Well nourished, well developed, in no acute distress  HEENT: normal  Neck: no JVD, no carotid bruits Cardiac: RRR; no murmur  No LE edema  Respiratory:  clear to auscultation  GI: soft, nontender   No hepatomegaly   EKG:  EKG is  not ordered today.  Echo   JUly 2024  1. Left ventricular ejection fraction, by estimation, is 60 to 65%. The  left ventricle has normal function. The left ventricle has no regional  wall motion abnormalities. Left ventricular diastolic parameters are  consistent with Grade I diastolic  dysfunction  (impaired relaxation).   2. Right ventricular systolic function is normal. The right ventricular  size is normal. There is normal pulmonary artery systolic pressure.   3. The mitral valve is normal in structure. Trivial mitral valve  regurgitation. No evidence of mitral stenosis.   4. The aortic valve is tricuspid. Aortic valve regurgitation is not  visualized. No aortic stenosis is present.   5. The inferior vena cava is normal in size with greater than 50%  respiratory variability, suggesting right atrial pressure of 3 mmHg.   Lipid Panel    Component Value Date/Time   CHOL 277 (H) 02/06/2022 1414   TRIG 171.0 (H) 02/06/2022 1414   HDL 46.70 02/06/2022 1414   CHOLHDL 6 02/06/2022 1414   VLDL 34.2 02/06/2022 1414   LDLCALC 196 (H) 02/06/2022 1414   LDLCALC 213 (H) 12/23/2021 1552   LDLDIRECT 209.0 08/07/2018 1401      Wt Readings from Last 3 Encounters:  06/06/23 139 lb 9.6 oz (63.3 kg)  05/24/23 137 lb 12.8 oz (62.5 kg)  04/19/23 138 lb (62.6 kg)      ASSESSMENT AND PLAN:  1 HTN   BP is controlled   Continue current meds   2 Chest pressure   Current symptoms sound GI in origin, do not sound like coronary ischemia.  Follow     3 Atherosclerosis   The pt has atherosclerosis of aorta on previous CT   Rx risk factors  4  Profound HL  LDL 190s/   On Crestor     Check lipids today    Will check Lipomed, Lpa, apoB, BMET and A1C     Current medicines are reviewed at length with the patient today.  The patient does not have concerns regarding medicines.  Signed, Dietrich Pates, MD  06/06/2023 2:09 PM    [facility] [address] [place], [place], [place]  [address] Phone: [phone]; Fax: [phone]

## 2023-06-06 ENCOUNTER — Ambulatory Visit: Payer: Medicare (Managed Care) | Attending: Internal Medicine | Admitting: Internal Medicine

## 2023-06-06 ENCOUNTER — Encounter: Payer: Self-pay | Admitting: Internal Medicine

## 2023-06-06 VITALS — BP 122/82 | HR 70 | Ht 61.0 in | Wt 139.6 lb

## 2023-06-06 DIAGNOSIS — Z79899 Other long term (current) drug therapy: Secondary | ICD-10-CM

## 2023-06-06 DIAGNOSIS — I10 Essential (primary) hypertension: Secondary | ICD-10-CM | POA: Diagnosis not present

## 2023-06-06 DIAGNOSIS — E7800 Pure hypercholesterolemia, unspecified: Secondary | ICD-10-CM | POA: Diagnosis not present

## 2023-06-06 DIAGNOSIS — R739 Hyperglycemia, unspecified: Secondary | ICD-10-CM

## 2023-06-06 DIAGNOSIS — R9431 Abnormal electrocardiogram [ECG] [EKG]: Secondary | ICD-10-CM | POA: Diagnosis not present

## 2023-06-06 NOTE — Patient Instructions (Signed)
Medication Instructions:   *If you need a refill on your cardiac medications before your next appointment, please call your pharmacy*   Lab Work:  BMET, NMR, APO B, LIPO A, HGBA1C TODAY   If you have labs (blood work) drawn today and your tests are completely normal, you will receive your results only by: MyChart Message (if you have MyChart) OR A paper copy in the mail If you have any lab test that is abnormal or we need to change your treatment, we will call you to review the results.   Testing/Procedures:    Follow-Up: At [facility], you and your health needs are our priority.  As part of our continuing mission to provide you with exceptional heart care, we have created designated Provider Care Teams.  These Care Teams include your primary Cardiologist (physician) and Advanced Practice Providers (APPs -  Physician Assistants and Nurse Practitioners) who all work together to provide you with the care you need, when you need it.  We recommend signing up for the patient portal called "MyChart".  Sign up information is provided on this After Visit Summary.  MyChart is used to connect with patients for Virtual Visits (Telemedicine).  Patients are able to view lab/test results, encounter notes, upcoming appointments, etc.  Non-urgent messages can be sent to your provider as well.   To learn more about what you can do with MyChart, go to [email or web address].    Your next appointment:   6 month(s)  Provider:   DR Dietrich Pates      Other Instructions REFERRAL TO GI FOR REFLUX

## 2023-06-07 LAB — BASIC METABOLIC PANEL
BUN/Creatinine Ratio: 11 — ABNORMAL LOW (ref 12–28)
BUN: 8 mg/dL (ref 8–27)
CO2: 25 mmol/L (ref 20–29)
Calcium: 9.2 mg/dL (ref 8.7–10.3)
Chloride: 104 mmol/L (ref 96–106)
Creatinine, Ser: 0.74 mg/dL (ref 0.57–1.00)
Glucose: 92 mg/dL (ref 70–99)
Potassium: 4.3 mmol/L (ref 3.5–5.2)
Sodium: 143 mmol/L (ref 134–144)
eGFR: 84 mL/min/{1.73_m2} (ref >59)

## 2023-06-07 LAB — NMR, LIPOPROFILE
Cholesterol, Total: 203 mg/dL — ABNORMAL HIGH (ref 100–199)
HDL Particle Number: 32.4 umol/L (ref >=30.5)
HDL-C: 46 mg/dL (ref >39)
LDL Particle Number: 1498 nmol/L — ABNORMAL HIGH (ref <1000)
LDL Size: 20.9 nm (ref >20.5)
LDL-C (NIH Calc): 125 mg/dL — ABNORMAL HIGH (ref 0–99)
LP-IR Score: 55 — ABNORMAL HIGH (ref <=45)
Small LDL Particle Number: 639 nmol/L — ABNORMAL HIGH (ref <=527)
Triglycerides: 180 mg/dL — ABNORMAL HIGH (ref 0–149)

## 2023-06-07 LAB — HEMOGLOBIN A1C
Est. average glucose Bld gHb Est-mCnc: 123 mg/dL
Hgb A1c MFr Bld: 5.9 % — ABNORMAL HIGH (ref 4.8–5.6)

## 2023-06-07 LAB — LIPOPROTEIN A (LPA): Lipoprotein (a): 106.1 nmol/L — ABNORMAL HIGH (ref <75.0)

## 2023-06-07 LAB — APOLIPOPROTEIN B: Apolipoprotein B: 104 mg/dL — ABNORMAL HIGH (ref <90)

## 2023-06-18 ENCOUNTER — Ambulatory Visit: Payer: Medicare (Managed Care)

## 2023-06-22 ENCOUNTER — Telehealth: Payer: Self-pay | Admitting: Internal Medicine

## 2023-06-22 DIAGNOSIS — E7800 Pure hypercholesterolemia, unspecified: Secondary | ICD-10-CM

## 2023-06-22 MED ORDER — ROSUVASTATIN CALCIUM 5 MG PO TABS: 5.0000 mg | ORAL_TABLET | Freq: Every day | ORAL | 3 refills | Status: DC

## 2023-06-22 NOTE — Telephone Encounter (Signed)
Mia Riffle, MD 06/09/2023  6:22 AM EDT     Lipids are still too high given that she has atherosclerosis   ON Crestor 3x per week   WOuld she consider increasing to daily   Follow up lipomed panel in 8 wks Watch diet     Minimize processed foods and simple carbs   ELectrolytes and kidney function are OK    Patient notified. She is agreeable to increasing Rosuvastatin to 5 mg daily. Prescription sent to [facility] on [address]  Patient will come in for fasting lab work on October 18

## 2023-06-22 NOTE — Telephone Encounter (Signed)
Patient is returning call to discuss lab results. 

## 2023-06-26 ENCOUNTER — Other Ambulatory Visit: Payer: Self-pay | Admitting: Family

## 2023-06-26 ENCOUNTER — Telehealth: Payer: Self-pay | Admitting: Family

## 2023-06-26 DIAGNOSIS — E039 Hypothyroidism, unspecified: Secondary | ICD-10-CM

## 2023-06-26 DIAGNOSIS — E890 Postprocedural hypothyroidism: Secondary | ICD-10-CM

## 2023-06-26 NOTE — Telephone Encounter (Signed)
Please remind her that she needs to get her TSH re-checked since she had been off her medication at time of last OV. I have lab order here in place- needs a lab appointment please.

## 2023-06-26 NOTE — Telephone Encounter (Signed)
Spoke with pt, pt stated she would like to get labs done at [facility] lab. PCP is aware and orders have been placed.

## 2023-06-27 ENCOUNTER — Telehealth: Payer: Self-pay | Admitting: Internal Medicine

## 2023-06-27 ENCOUNTER — Other Ambulatory Visit: Payer: Self-pay

## 2023-06-27 DIAGNOSIS — Z79899 Other long term (current) drug therapy: Secondary | ICD-10-CM

## 2023-06-27 DIAGNOSIS — E7800 Pure hypercholesterolemia, unspecified: Secondary | ICD-10-CM

## 2023-06-27 MED ORDER — ROSUVASTATIN CALCIUM 5 MG PO TABS: 5.0000 mg | ORAL_TABLET | Freq: Every day | ORAL | 3 refills | Status: DC

## 2023-06-27 NOTE — Telephone Encounter (Signed)
Pt called and made 08/24/23 lab appt.

## 2023-06-27 NOTE — Telephone Encounter (Signed)
Patient is returning call in regards to lab results.

## 2023-06-29 ENCOUNTER — Other Ambulatory Visit (INDEPENDENT_AMBULATORY_CARE_PROVIDER_SITE_OTHER): Payer: Medicare (Managed Care)

## 2023-06-29 ENCOUNTER — Other Ambulatory Visit: Payer: Self-pay | Admitting: Family

## 2023-06-29 DIAGNOSIS — E7800 Pure hypercholesterolemia, unspecified: Secondary | ICD-10-CM

## 2023-06-29 DIAGNOSIS — E039 Hypothyroidism, unspecified: Secondary | ICD-10-CM

## 2023-06-29 DIAGNOSIS — Z79899 Other long term (current) drug therapy: Secondary | ICD-10-CM

## 2023-06-29 LAB — TSH: TSH: 2.13 u[IU]/mL (ref 0.35–5.50)

## 2023-06-29 MED ORDER — LEVOTHYROXINE SODIUM 75 MCG PO TABS: 75.0000 ug | ORAL_TABLET | Freq: Every day | ORAL | 3 refills | Status: DC

## 2023-07-09 ENCOUNTER — Ambulatory Visit (HOSPITAL_COMMUNITY)
Admission: EM | Admit: 2023-07-09 | Discharge: 2023-07-09 | Disposition: A | Discharge status: Home / Self Care | Payer: Medicare (Managed Care) | Attending: Family Medicine | Admitting: Family Medicine

## 2023-07-09 ENCOUNTER — Encounter (HOSPITAL_COMMUNITY): Payer: Self-pay | Admitting: Emergency Medicine

## 2023-07-09 ENCOUNTER — Other Ambulatory Visit: Payer: Self-pay

## 2023-07-09 DIAGNOSIS — L309 Dermatitis, unspecified: Secondary | ICD-10-CM

## 2023-07-09 DIAGNOSIS — M79671 Pain in right foot: Secondary | ICD-10-CM

## 2023-07-09 MED ORDER — PREDNISONE 20 MG PO TABS: 40.0000 mg | ORAL_TABLET | Freq: Every day | ORAL | 0 refills | Status: AC

## 2023-07-09 NOTE — Discharge Instructions (Signed)
Take prednisone 20 mg--2 daily for 5 days  Can also take Tylenol over-the-counter for your heel pain.  Please wear more supportive shoes  Follow-up with your primary care about these issues.

## 2023-07-09 NOTE — ED Triage Notes (Signed)
Patient also complains about Achilles pain on right foot

## 2023-07-09 NOTE — ED Provider Notes (Signed)
[facility]    CSN: [id number] Arrival date & time: 07/09/23  1529      History   Chief Complaint No chief complaint on file.   HPI Mia Newman is a 77 y.o. female.   HPI Here for red bumps that itch on her legs.  She has been having this rash for about 1 month.  She does feel the triamcinolone cream has been helpful somewhat; she had this from another rash on her hands earlier this year.  No fever no trouble breathing.     she also notes a little bit of pain in her right Achilles.  That is been going on for a while.  No fall or trauma    Past Medical History:  Diagnosis Date   Allergy    Arthritis    ASYMPTOMATIC POSTMENOPAUSAL STATUS 03/03/2010   DIVERTICULITIS-COLON 05/22/2008   DIVERTICULOSIS, COLON 05/20/2008   DM 04/22/2008   borderline   Glaucoma    Hyperthyroidism    Irritable bowel syndrome    Personal history of colonic polyp - adenoma 03/03/2014   Pure hypercholesterolemia 09/27/2007   Unspecified hypothyroidism 04/22/2008    Patient Active Problem List   Diagnosis Date Noted   Periumbilical abdominal tenderness without rebound tenderness 07/24/2019   Diverticulitis of large intestine without perforation or abscess without bleeding 07/24/2019   Hyperglycemia 08/07/2018   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   Left rotator cuff tear 06/19/2017   Vaginitis and vulvovaginitis 06/09/2014   Meningioma (HCC) 09/02/2013   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      Home Medications    Prior to Admission medications   Medication Sig Start Date End Date Taking? Authorizing Provider  predniSONE (DELTASONE) 20 MG tablet Take 2 tablets (40 mg total) by mouth daily with breakfast for 5 days. 07/09/23 07/14/23 Yes Zenia Resides, MD  amLODipine (NORVASC) 2.5 MG tablet Take 1 tablet (2.5 mg total) by mouth daily. 04/19/23   Pricilla Riffle, MD  Ascorbic Acid (VITAMIN C) 1000 MG tablet Take 1,000 mg by mouth daily.    [provider]  calcium carbonate (OSCAL) 1500 (600 Ca) MG TABS tablet Take 600 mg of elemental calcium by mouth 2 (two) times daily with a meal.    [provider]  Cholecalciferol (VITAMIN D-3) 125 MCG (5000 UT) TABS Take by mouth.    [provider]  famotidine (PEPCID) 20 MG tablet Take 1 tablet (20 mg total) by mouth 2 (two) times daily. 05/24/23   Olive Bass, FNP  IBUPROFEN PO Take by mouth.    [provider]  latanoprost (XALATAN) 0.005 % ophthalmic solution Place 1 drop into both eyes at bedtime.    [provider]  levothyroxine (SYNTHROID) 75 MCG tablet Take 1 tablet (75 mcg total) by mouth daily before breakfast. 06/29/23   Olive Bass, FNP  rosuvastatin (  CRESTOR) 5 MG tablet Take 1 tablet (5 mg total) by mouth daily. 06/27/23   Pricilla Riffle, MD  triamcinolone cream (KENALOG) 0.1 % Apply 1 Application topically 2 (two) times daily. Apply to affected area two times a day 04/06/23   G[place], [place] N, FNP    Family History Family History  Problem Relation Age of Onset   Hypertension Mother    Heart attack Father 77   Cancer Maternal Grandmother        Uterine Cancer   Stomach cancer Paternal Grandmother 66   Colon cancer Neg Hx    Esophageal cancer Neg Hx     Social History Social History   Tobacco Use   Smoking status: Never   Smokeless tobacco: Never  Vaping Use   Vaping status: Never  Used  Substance Use Topics   Alcohol use: No   Drug use: No     Allergies   Atorvastatin, Raloxifene, Simvastatin, and Sulfamethoxazole   Review of Systems Review of Systems   Physical Exam Triage Vital Signs ED Triage Vitals  Encounter Vitals Group     BP 07/09/23 1656 (!) 177/92     Systolic BP Percentile --      Diastolic BP Percentile --      Pulse Rate 07/09/23 1656 67     Resp 07/09/23 1656 18     Temp 07/09/23 1656 98.1 F (36.7 C)     Temp Source 07/09/23 1656 Oral     SpO2 07/09/23 1656 98 %     Weight --      Height --      Head Circumference --      Peak Flow --      Pain Score 07/09/23 1654 0     Pain Loc --      Pain Education --      Exclude from Growth Chart --    No data found.  Updated Vital Signs BP (!) 177/92 (BP Location: Left Arm)   Pulse 67   Temp 98.1 F (36.7 C) (Oral)   Resp 18   SpO2 98%   Visual Acuity Right Eye Distance:   Left Eye Distance:   Bilateral Distance:    Right Eye Near:   Left Eye Near:    Bilateral Near:     Physical Exam Vitals reviewed.  Constitutional:      General: She is not in acute distress.    Appearance: She is not ill-appearing, toxic-appearing or diaphoretic.  HENT:     Mouth/Throat:     Mouth: Mucous membranes are moist.  Cardiovascular:     Rate and Rhythm: Normal rate and regular rhythm.  Pulmonary:     Breath sounds: Normal breath sounds.  Musculoskeletal:     Comments: There is some mild tenderness of the right Achilles tendon.  No deformity  Skin:    Coloration: Skin is not jaundiced or pale.     Comments: There is a faint scattered erythematous bumpy rash on her lower legs, mainly on the anterior side but a little bit on the posterior.  There is 1 spot that is about 2 mm in diameter that is a little scaly feeling.  No pus or fluctuance    Neurological:     Mental Status: She is alert and oriented to person, place, and time.  Psychiatric:        Behavior: Behavior normal.       UC Treatments / Results  Labs (all labs ordered are listed,  but only abnormal results are displayed) Labs Reviewed - No data to display  EKG   Radiology No results found.  Procedures Procedures (including critical care time)  Medications Ordered in UC Medications - No data to display  Initial Impression / Assessment and Plan / UC Course  I have reviewed the triage vital signs and the nursing notes.  Pertinent labs & imaging results that were available during my care of the patient were reviewed by me and considered in my medical decision making (see chart for details).        Short burst prednisone is sent in for the dermatitis and possibly help the Achilles tendinitis also.  She is going to wear better supportive shoes.  She will follow-up with her primary care about those issues. Final Clinical Impressions(s) / UC Diagnoses   Final diagnoses:  Dermatitis  Pain of right heel     Discharge Instructions      Take prednisone 20 mg--2 daily for 5 days  Can also take Tylenol over-the-counter for your heel pain.  Please wear more supportive shoes  Follow-up with your primary care about these issues.     ED Prescriptions     Medication Sig Dispense Auth. Provider   predniSONE (DELTASONE) 20 MG tablet Take 2 tablets (40 mg total) by mouth daily with breakfast for 5 days. 10 tablet Marlinda Mike Janace Aris, MD      PDMP not reviewed this encounter.   Zenia Resides, MD 07/09/23 [phone]

## 2023-07-09 NOTE — ED Triage Notes (Signed)
Patient complains of possible insect bites.  .  Reports bumps on both legs, bumps itch and patient thinks it is black ants.  Patient is using cream she was given last time she was here -possibly triamcinolone cream

## 2023-08-08 DIAGNOSIS — H2513 Age-related nuclear cataract, bilateral: Secondary | ICD-10-CM | POA: Diagnosis not present

## 2023-08-08 DIAGNOSIS — H5340 Unspecified visual field defects: Secondary | ICD-10-CM | POA: Diagnosis not present

## 2023-08-08 DIAGNOSIS — H401232 Low-tension glaucoma, bilateral, moderate stage: Secondary | ICD-10-CM | POA: Diagnosis not present

## 2023-08-24 ENCOUNTER — Ambulatory Visit: Payer: Medicare (Managed Care) | Attending: Family

## 2023-09-12 IMAGING — MG MM DIGITAL DIAGNOSTIC UNILAT*L* W/ TOMO W/ CAD
8 series · 9 of 24 positions shown · non-contrast
Comparison: Previous exams including recent screening mammogram
dated 01/05/2022.

CLINICAL DATA: Patient returns today to evaluate a possible LEFT
breast mass questioned on recent screening mammogram.

EXAM:
DIGITAL DIAGNOSTIC UNILATERAL LEFT MAMMOGRAM WITH TOMOSYNTHESIS AND
CAD
TECHNIQUE: Left digital diagnostic mammography and breast tomosynthesis was
performed. The images were evaluated with computer-aided detection.

[L CC synth-2D]
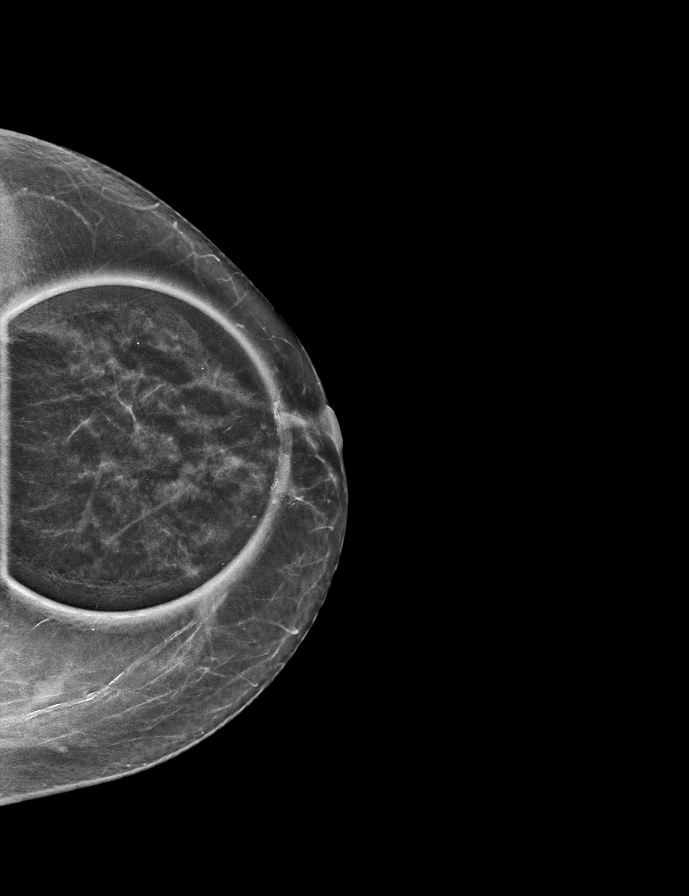

[L MLO synth-2D (1 of 2)]
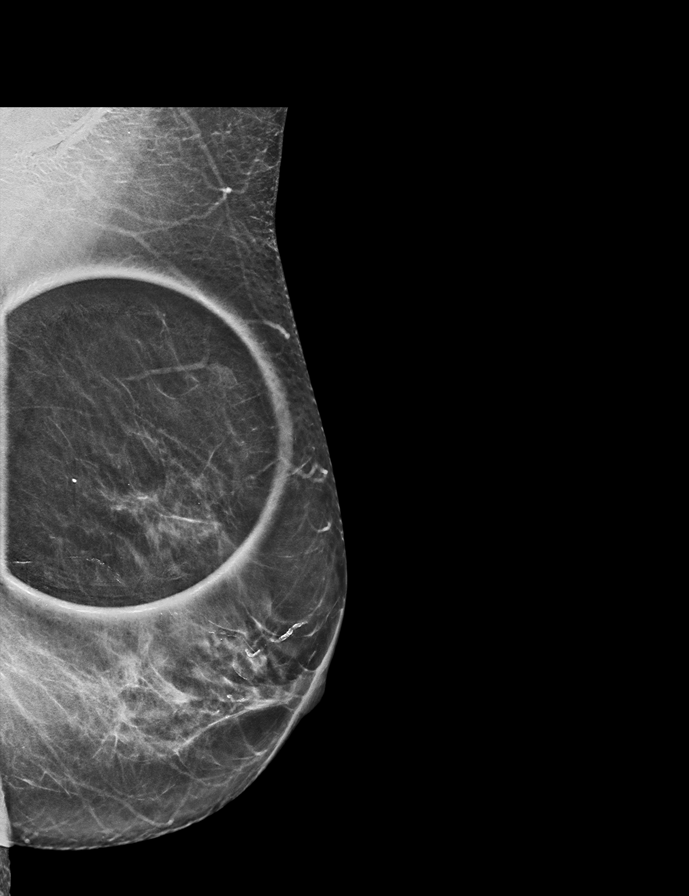

[L MLO synth-2D (2 of 2)]
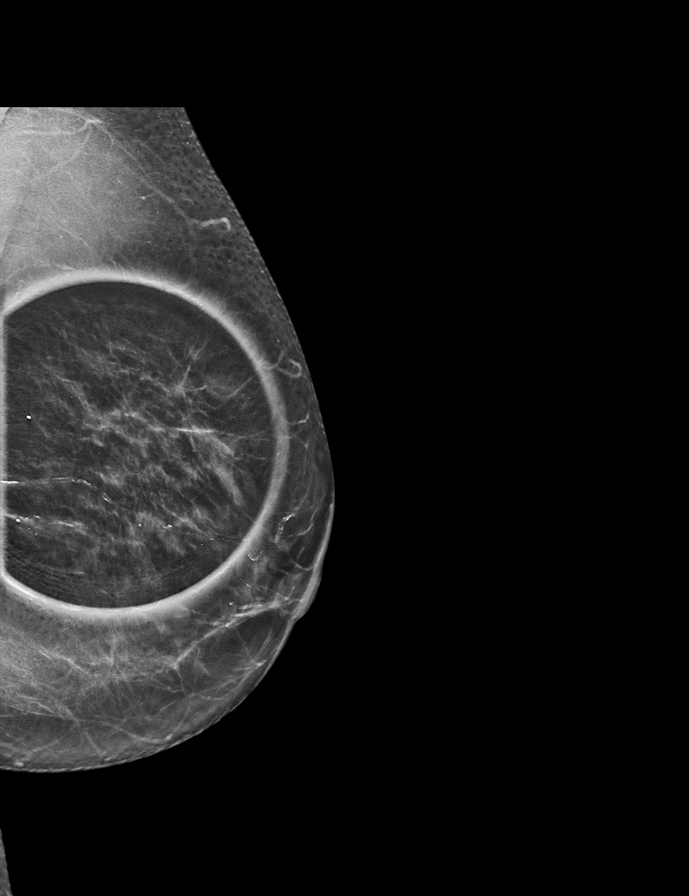

[L ML synth-2D]
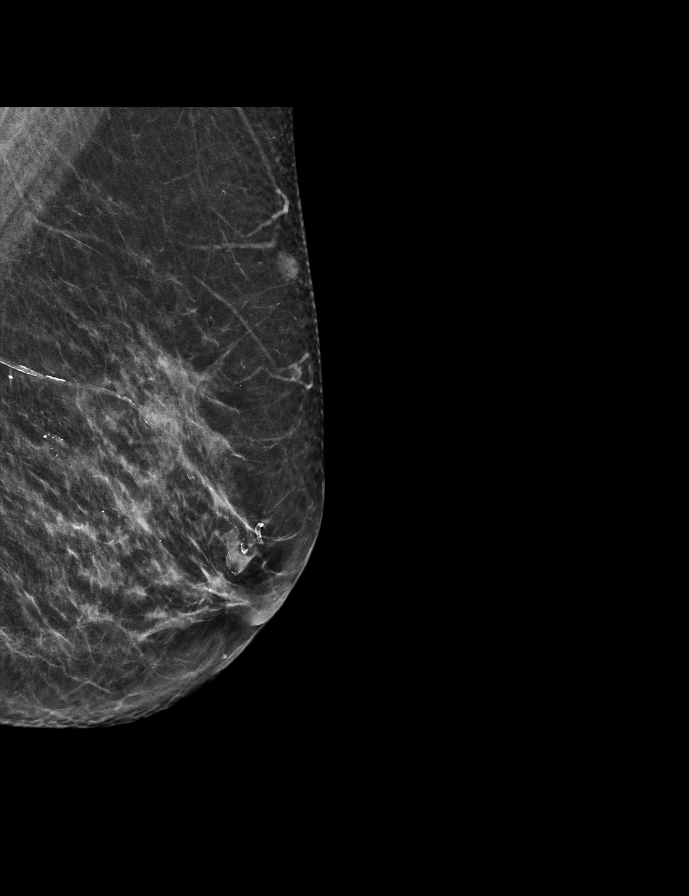

[L MLO tomo · 2 of 54 frames shown (1 of 2)]
[frame 18/54]
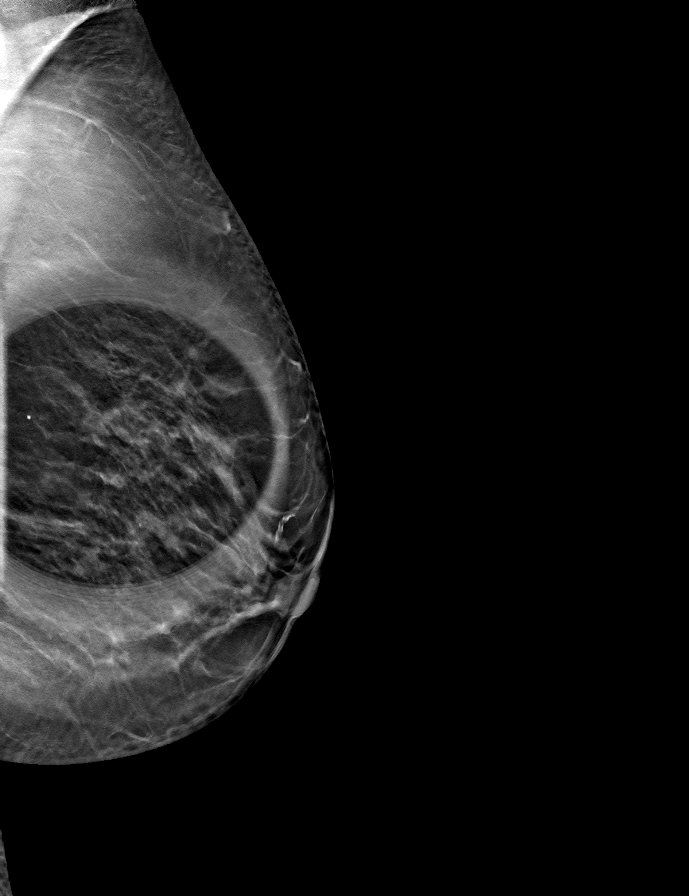
[frame 27/54]
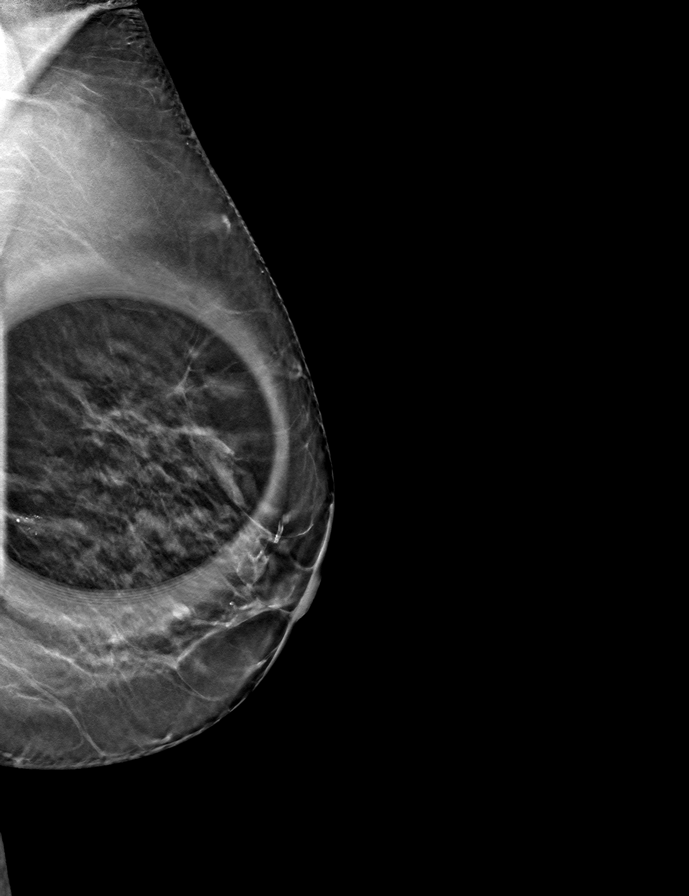

[L ML tomo · tomo slice 30/59.0]
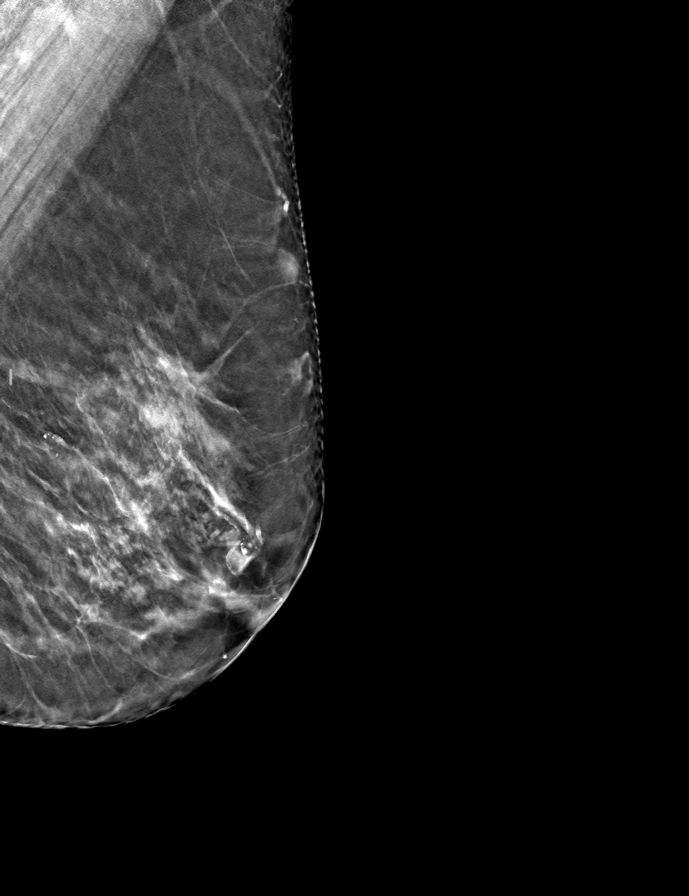

[L CC tomo · tomo slice 27/54.0]
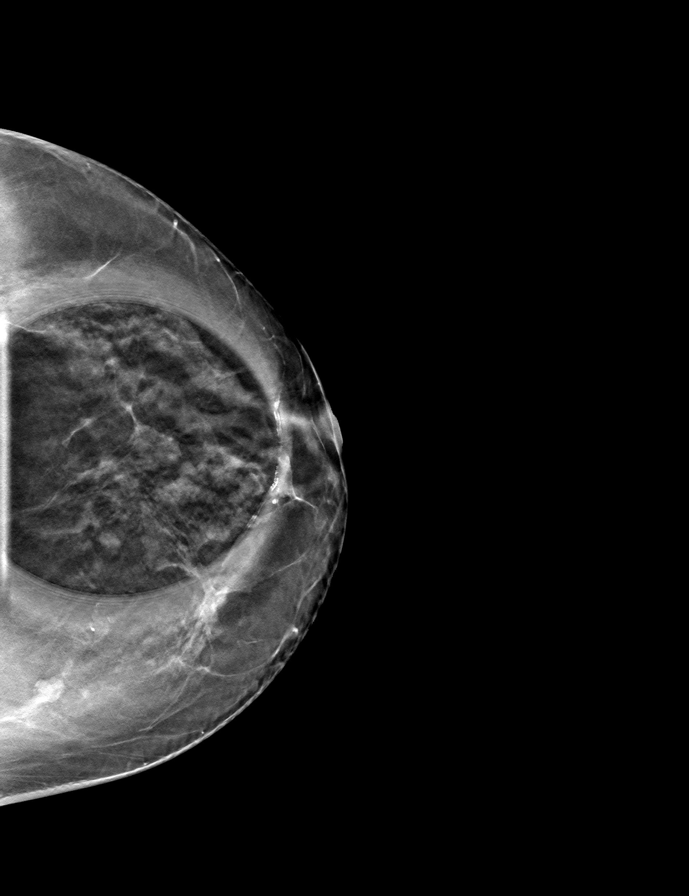

[L MLO tomo (2 of 2) · tomo slice 30/59.0]
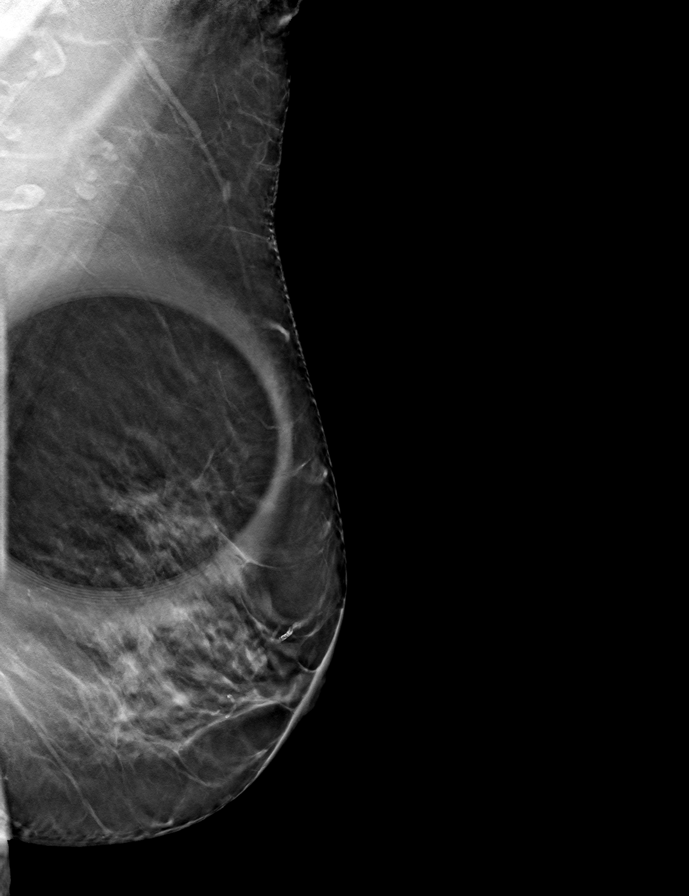

[9 of 24 positions shown; findings below may reference images not displayed]

ACR Breast Density Category c: The breast tissue is heterogeneously
dense, which may obscure small masses.
FINDINGS: On today's additional diagnostic views, including spot compression
with 3D tomosynthesis, the questioned abnormality within the
upper/central LEFT breast is resolved indicating superimposition of
normal dense fibroglandular tissues.
IMPRESSION: No evidence of malignancy.

Patient may return to routine annual bilateral screening mammogram
schedule.

RECOMMENDATION:
Screening mammogram in one year.(Code:H7-I-6JN)

I have discussed the findings and recommendations with the patient.
If applicable, a reminder letter will be sent to the patient
regarding the next appointment.

BI-RADS CATEGORY  1: Negative.

## 2023-11-02 ENCOUNTER — Other Ambulatory Visit: Payer: Self-pay | Admitting: Family

## 2023-11-02 MED ORDER — LEVOTHYROXINE SODIUM 75 MCG PO TABS: 75.0000 ug | ORAL_TABLET | Freq: Every day | ORAL | 3 refills | Status: DC

## 2023-11-05 ENCOUNTER — Telehealth: Payer: Self-pay | Admitting: Family

## 2023-11-05 NOTE — Telephone Encounter (Signed)
 Copied from CRM [id number]. Topic: Clinical - Prescription Issue >> Nov 02, 2023  1:11 PM Joanell B wrote: Reason for CRM: [employer] called from [facility] and stated that they are needing prior authorization for medication levothyroxine  (SYNTHROID ) 75 MCG tablet because they are needing to change the manufacture. Callback [phone]

## 2023-11-06 NOTE — Telephone Encounter (Signed)
 Spoke with pts pharmacy, pt has picked up medication from pharmacy.

## 2023-12-21 ENCOUNTER — Ambulatory Visit: Payer: Medicare (Managed Care) | Admitting: Family

## 2023-12-21 ENCOUNTER — Encounter: Payer: Self-pay | Admitting: Family

## 2023-12-21 VITALS — BP 138/80 | HR 65 | Ht 61.0 in | Wt 133.2 lb

## 2023-12-21 DIAGNOSIS — L309 Dermatitis, unspecified: Secondary | ICD-10-CM | POA: Diagnosis not present

## 2023-12-21 DIAGNOSIS — Z13820 Encounter for screening for osteoporosis: Secondary | ICD-10-CM | POA: Diagnosis not present

## 2023-12-21 DIAGNOSIS — R350 Frequency of micturition: Secondary | ICD-10-CM

## 2023-12-21 DIAGNOSIS — Z1231 Encounter for screening mammogram for malignant neoplasm of breast: Secondary | ICD-10-CM

## 2023-12-21 DIAGNOSIS — E039 Hypothyroidism, unspecified: Secondary | ICD-10-CM | POA: Diagnosis not present

## 2023-12-21 DIAGNOSIS — R7303 Prediabetes: Secondary | ICD-10-CM

## 2023-12-21 DIAGNOSIS — M8580 Other specified disorders of bone density and structure, unspecified site: Secondary | ICD-10-CM

## 2023-12-21 MED ORDER — TRIAMCINOLONE ACETONIDE 0.1 % EX CREA: 1.0000 | TOPICAL_CREAM | Freq: Two times a day (BID) | CUTANEOUS | 0 refills | Status: DC

## 2023-12-21 NOTE — Progress Notes (Signed)
 Mia Newman is a 78 y.o. 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 90 tablet 3   Ascorbic Acid (VITAMIN C) 1000 MG tablet Take 1,000 mg by mouth daily.     calcium carbonate (OSCAL) 1500 (600 Ca) MG TABS tablet Take 600 mg of elemental calcium by mouth 2 (two) times daily with a meal.     Cholecalciferol (VITAMIN D-3) 125 MCG (5000 UT) TABS Take by mouth.     famotidine (PEPCID) 20 MG tablet Take 1 tablet (20 mg total) by mouth 2 (two) times daily.     IBUPROFEN PO Take by mouth.     latanoprost (XALATAN) 0.005 % ophthalmic solution Place 1 drop into both eyes at bedtime.     levothyroxine (SYNTHROID) 75 MCG tablet Take 1 tablet (75 mcg total) by mouth daily before breakfast. 90 tablet 3   rosuvastatin (CRESTOR) 5 MG tablet Take 1 tablet (5 mg total) by mouth daily. 90 tablet 3   triamcinolone cream (KENALOG) 0.1 % Apply 1 Application topically 2 (two) times daily. Apply to affected area two times a day 30 g 0   No current facility-administered medications for this visit.    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 ([facility])   BREAST BIOPSY Left 03/08/2023   MM LT BREAST BX W LOC DEV 1ST LESION IMAGE BX SPEC STEREO GUIDE 03/08/2023 [facility]   BREAST SURGERY     COLONOSCOPY     TUBAL LIGATION     VSD REPAIR  1996    Family History  Problem Relation Age of Onset   Hypertension Mother    Heart attack Father 5   Cancer Maternal Grandmother        Uterine Cancer   Stomach cancer Paternal Grandmother 41   Colon cancer Neg Hx    Esophageal cancer Neg Hx     Social History   Tobacco Use   Smoking status: Never   Smokeless tobacco: Never  Substance Use Topics   Alcohol use: No    Subjective:   Follow up to discuss chronic care needs; received a "conversation starter" from her insurance company and decided to follow up; Wanted to check on status of her bone density; does feel that she has worsening urinary frequency-  over the past year- would be interested in starting medication if appropriate; Requesting refill on cream that she can use if she has allergic reaction when working in her garden;    Objective:  Vitals:   12/21/23 1307  BP: 138/80  Pulse: 65  SpO2: 97%  Weight: 133 lb 3.2 oz (60.4 kg)  Height: 5\' 1"  (1.549 m)    General: Well developed, well nourished, in no acute distress  Skin : Warm and dry.  Head: Normocephalic and atraumatic  Lungs: Respirations unlabored; clear to auscultation bilaterally without wheeze, rales, rhonchi  CVS exam: normal rate and regular rhythm.   Neurologic: Alert and oriented; speech  intact; face symmetrical; moves all extremities well; CNII-XII intact without focal deficit   Assessment:  1. Pre-diabetes   2. Hypothyroidism, unspecified type   3. Visit for screening mammogram   4. Osteopenia, unspecified location   5. Osteoporosis screening   6. Urinary frequency   7. Dermatitis     Plan:  Update labs today; 2.   Will update TSH today- will update Rx as needed; 3.   Order updated; 4. & 5.  Order for DEXA updated; 6.   Check U/A and urine culture; assuming no infection, can consider trial of Myrbetriq; 7.   Rx for Triamcinolone cream apply bid to affected area prn;    No follow-ups on file.  Orders Placed This Encounter  Procedures   Urine Culture   MM Digital Screening    Standing Status:   Future    Expiration Date:   12/20/2024    Scheduling Instructions:     Due in May 2025    Reason for Exam (SYMPTOM  OR DIAGNOSIS REQUIRED):   screening mammogram    Preferred imaging location?:   [facility]   DG Bone Density    Standing Status:   Future    Expiration Date:   12/20/2024    Scheduling Instructions:     Due August 2025    Reason for Exam (SYMPTOM  OR DIAGNOSIS REQUIRED):   ovarian failure/ osteoporosis screening    Preferred imaging location?:   [facility]   Urinalysis   CBC with Differential/Platelet   Comp Met (CMET)   Hemoglobin A1c   TSH    Requested Prescriptions   Signed Prescriptions Disp Refills   triamcinolone cream (KENALOG) 0.1 % 30 g 0    Sig: Apply 1 Application topically 2 (two) times daily. Apply to affected area two times a day

## 2023-12-22 LAB — HEMOGLOBIN A1C
Hgb A1c MFr Bld: 6 %{Hb} — ABNORMAL HIGH (ref <5.7)
Mean Plasma Glucose: 126 mg/dL
eAG (mmol/L): 7 mmol/L

## 2023-12-22 LAB — COMPREHENSIVE METABOLIC PANEL
AG Ratio: 1.8 (calc) (ref 1.0–2.5)
ALT: 17 U/L (ref 6–29)
AST: 16 U/L (ref 10–35)
Albumin: 4.3 g/dL (ref 3.6–5.1)
Alkaline phosphatase (APISO): 48 U/L (ref 37–153)
BUN/Creatinine Ratio: 8 (calc) (ref 6–22)
BUN: 6 mg/dL — ABNORMAL LOW (ref 7–25)
CO2: 27 mmol/L (ref 20–32)
Calcium: 9.8 mg/dL (ref 8.6–10.4)
Chloride: 106 mmol/L (ref 98–110)
Creat: 0.75 mg/dL (ref 0.60–1.00)
Globulin: 2.4 g/dL (ref 1.9–3.7)
Glucose, Bld: 99 mg/dL (ref 65–99)
Potassium: 4.3 mmol/L (ref 3.5–5.3)
Sodium: 143 mmol/L (ref 135–146)
Total Bilirubin: 0.4 mg/dL (ref 0.2–1.2)
Total Protein: 6.7 g/dL (ref 6.1–8.1)

## 2023-12-22 LAB — CBC WITH DIFFERENTIAL/PLATELET
Absolute Lymphocytes: 1817 {cells}/uL (ref 850–3900)
Absolute Monocytes: 502 {cells}/uL (ref 200–950)
Basophils Absolute: 62 {cells}/uL (ref 0–200)
Basophils Relative: 1 %
Eosinophils Absolute: 192 {cells}/uL (ref 15–500)
Eosinophils Relative: 3.1 %
HCT: 43.8 % (ref 35.0–45.0)
Hemoglobin: 14.6 g/dL (ref 11.7–15.5)
MCH: 30.1 pg (ref 27.0–33.0)
MCHC: 33.3 g/dL (ref 32.0–36.0)
MCV: 90.3 fL (ref 80.0–100.0)
MPV: 11 fL (ref 7.5–12.5)
Monocytes Relative: 8.1 %
Neutro Abs: 3627 {cells}/uL (ref 1500–7800)
Neutrophils Relative %: 58.5 %
Platelets: 287 10*3/uL (ref 140–400)
RBC: 4.85 10*6/uL (ref 3.80–5.10)
RDW: 13.4 % (ref 11.0–15.0)
Total Lymphocyte: 29.3 %
WBC: 6.2 10*3/uL (ref 3.8–10.8)

## 2023-12-22 LAB — TSH: TSH: 0.22 m[IU]/L — ABNORMAL LOW (ref 0.40–4.50)

## 2023-12-23 LAB — URINALYSIS
Bilirubin Urine: NEGATIVE
Glucose, UA: NEGATIVE
Hgb urine dipstick: NEGATIVE
Ketones, ur: NEGATIVE
Nitrite: NEGATIVE
Protein, ur: NEGATIVE
Specific Gravity, Urine: 1.007 (ref 1.001–1.035)
pH: 8 (ref 5.0–8.0)

## 2023-12-23 LAB — URINE CULTURE
MICRO NUMBER:: 16143733
SPECIMEN QUALITY:: ADEQUATE

## 2023-12-25 ENCOUNTER — Other Ambulatory Visit: Payer: Self-pay | Admitting: Family

## 2023-12-25 DIAGNOSIS — E039 Hypothyroidism, unspecified: Secondary | ICD-10-CM

## 2023-12-25 MED ORDER — NITROFURANTOIN MONOHYD MACRO 100 MG PO CAPS: 100.0000 mg | ORAL_CAPSULE | Freq: Two times a day (BID) | ORAL | 0 refills | Status: DC

## 2023-12-28 ENCOUNTER — Telehealth: Payer: Self-pay | Admitting: Family

## 2023-12-28 NOTE — Telephone Encounter (Signed)
 Copied from CRM [id number]. Topic: Medicare AWV >> Dec 28, 2023  9:41 AM Payton Doughty wrote: Reason for CRM: Called LVM 12/28/2023 to schedule AWV. Please schedule Virtual or Telehealth visits ONLY.   Verlee Rossetti; Care Guide Ambulatory Clinical Support [place] [facility] Direct Dial: [phone]

## 2024-01-31 ENCOUNTER — Telehealth: Payer: Self-pay

## 2024-01-31 NOTE — Telephone Encounter (Signed)
 Copied from CRM [id number]. Topic: Clinical - Medical Advice >> Jan 31, 2024  2:20 PM Jon Gills C wrote: Reason for CRM: Patient called in stated she received a letter to schedule her mammogram for this year, once she called they ask if she was experiencing  any pain in her breast she stated she was alil in her left breast, they stated she would need an diagnostics test for the mammogram , she would like for someone to give her a callback regarding this

## 2024-02-01 NOTE — Telephone Encounter (Signed)
 Spoke with pt, pt states she is currently having L sided breast pain, pt also states she did a self breast exam and did not "feel any bumps". Advised pt a message would be sent back to PCP and pt will receive a phone call to schedule mammogram.

## 2024-02-04 ENCOUNTER — Other Ambulatory Visit: Payer: Self-pay | Admitting: Family

## 2024-02-04 DIAGNOSIS — N644 Mastodynia: Secondary | ICD-10-CM

## 2024-02-05 ENCOUNTER — Other Ambulatory Visit (INDEPENDENT_AMBULATORY_CARE_PROVIDER_SITE_OTHER): Payer: Medicare (Managed Care)

## 2024-02-05 DIAGNOSIS — E039 Hypothyroidism, unspecified: Secondary | ICD-10-CM | POA: Diagnosis not present

## 2024-02-06 ENCOUNTER — Other Ambulatory Visit: Payer: Self-pay | Admitting: Family

## 2024-02-06 LAB — TSH: TSH: 0.18 u[IU]/mL — ABNORMAL LOW (ref 0.35–5.50)

## 2024-02-06 MED ORDER — LEVOTHYROXINE SODIUM 50 MCG PO TABS: 50.0000 ug | ORAL_TABLET | Freq: Every day | ORAL | 3 refills | Status: DC

## 2024-02-22 ENCOUNTER — Ambulatory Visit
Admission: RE | Admit: 2024-02-22 | Discharge: 2024-02-22 | Disposition: A | Discharge status: Home / Self Care | Payer: Medicare (Managed Care) | Source: Ambulatory Visit | Attending: Family | Admitting: Family

## 2024-02-22 ENCOUNTER — Ambulatory Visit: Payer: Medicare (Managed Care)

## 2024-02-22 DIAGNOSIS — N644 Mastodynia: Secondary | ICD-10-CM

## 2024-03-13 ENCOUNTER — Telehealth: Payer: Self-pay | Admitting: Family

## 2024-03-13 NOTE — Telephone Encounter (Signed)
 Copied from CRM [id number]. Topic: Referral - Request for Referral >> Mar 13, 2024  3:09 PM Freya Jesus wrote: Did the patient discuss referral with their provider in the last year? Yes (If No - schedule appointment) (If Yes - send message)  Appointment offered? Yes  Type of order/referral and detailed reason for visit: Breast pain - Patient stated that they advised her to send another referral even though she saw them on 02/22/24. She's still experiencing pain.  Preference of office, provider, location: [facility]  If referral order, have you been seen by this specialty before? Yes (If Yes, this issue or another issue? When? Where? - Yes on 02/22/24 for the same issue.  Can we respond through MyChart? No

## 2024-03-13 NOTE — Telephone Encounter (Signed)
 Spoke w/ Pt- appt scheduled w/ Rice Chamorro next week, she already had normal diagnostic on 02/22/24.

## 2024-03-18 ENCOUNTER — Ambulatory Visit (INDEPENDENT_AMBULATORY_CARE_PROVIDER_SITE_OTHER): Payer: Medicare (Managed Care)

## 2024-03-18 ENCOUNTER — Ambulatory Visit: Payer: Medicare (Managed Care) | Admitting: Family

## 2024-03-18 ENCOUNTER — Encounter: Payer: Self-pay | Admitting: Family

## 2024-03-18 VITALS — Ht 61.0 in | Wt 133.0 lb

## 2024-03-18 VITALS — BP 138/80 | HR 57 | Temp 98.0°F | Ht 61.0 in | Wt 134.0 lb

## 2024-03-18 DIAGNOSIS — E890 Postprocedural hypothyroidism: Secondary | ICD-10-CM

## 2024-03-18 DIAGNOSIS — Z0000 Encounter for general adult medical examination without abnormal findings: Secondary | ICD-10-CM

## 2024-03-18 DIAGNOSIS — N644 Mastodynia: Secondary | ICD-10-CM

## 2024-03-18 MED ORDER — CEPHALEXIN 500 MG PO CAPS
500.0000 mg | ORAL_CAPSULE | Freq: Three times a day (TID) | ORAL | 0 refills | Status: AC
Start: 2024-03-18 — End: 2024-03-23

## 2024-03-18 NOTE — Patient Instructions (Signed)
 Mia Newman , Thank you for taking time out of your busy schedule to complete your Annual Wellness Visit with me. I enjoyed our conversation and look forward to speaking with you again next year. I, as well as your care team,  appreciate your ongoing commitment to your health goals. Please review the following plan we discussed and let me know if I can assist you in the future. Your Game plan/ To Do List   Follow up Visits: Next Medicare AWV with our clinical staff: In 1 year    Have you seen your provider in the last 6 months (3 months if uncontrolled diabetes)? Yes Next Office Visit with your provider: 03/18/24 @ 2:20  Clinician Recommendations:  Aim for 30 minutes of exercise or brisk walking, 6-8 glasses of water, and 5 servings of fruits and vegetables each day.       This is a list of the screening recommended for you and due dates:  Health Maintenance  Topic Date Due   COVID-19 Vaccine (4 - 2024-25 season) 06/24/2023   Flu Shot  05/23/2024   Medicare Annual Wellness Visit  03/18/2025   Colon Cancer Screening  02/08/2026   DTaP/Tdap/Td vaccine (3 - Td or Tdap) 03/05/2030   Pneumonia Vaccine  Completed   DEXA scan (bone density measurement)  Completed   Hepatitis C Screening  Completed   Zoster (Shingles) Vaccine  Completed   HPV Vaccine  Aged Out   Meningitis B Vaccine  Aged Out    Advanced directives: (ACP Link)Information on Advanced Care Planning can be found at Passenger transport manager Health Care Directives Advance Health Care Directives. [email or web address]   Advance Care Planning is important because it:  [x]  Makes sure you receive the medical care that is consistent with your values, goals, and preferences  [x]  It provides guidance to your family and loved ones and reduces their decisional burden about whether or not they are making the right decisions based on your wishes.  Follow the link provided in your after visit summary or read over the paperwork we have  mailed to you to help you started getting your Advance Directives in place. If you need assistance in completing these, please reach out to us  so that we can help you!  See attachments for Preventive Care and Fall Prevention Tips.

## 2024-03-18 NOTE — Progress Notes (Signed)
 KEAJAH KILLOUGH is a 78 y.o. 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 90 tablet 3   Ascorbic Acid (VITAMIN C) 1000 MG tablet Take 1,000 mg by mouth daily.     calcium  carbonate (OSCAL) 1500 (600 Ca) MG TABS tablet Take 600 mg of elemental calcium  by mouth 2 (two) times daily with a meal.     cephALEXin  (KEFLEX ) 500 MG capsule Take 1 capsule (500 mg total) by mouth 3 (three) times daily for 5 days. 15 capsule 0   Cholecalciferol (VITAMIN D-3) 125 MCG (5000 UT) TABS Take by mouth.     famotidine  (PEPCID ) 20 MG tablet Take 1 tablet (20 mg total) by mouth 2 (two) times daily.     IBUPROFEN PO Take by mouth.     latanoprost (XALATAN) 0.005 % ophthalmic solution Place 1 drop into both eyes at bedtime.     levothyroxine  (SYNTHROID ) 50 MCG tablet Take 1 tablet (50 mcg total) by mouth daily. 30 tablet 3   nitrofurantoin , macrocrystal-monohydrate, (MACROBID ) 100 MG capsule Take 1 capsule (100 mg total) by mouth 2 (two) times daily. 14 capsule 0   rosuvastatin  (CRESTOR ) 5 MG tablet Take 1 tablet (5 mg total) by mouth daily. 90 tablet 3   triamcinolone  cream (KENALOG ) 0.1 % Apply 1 Application topically 2 (two) times daily. Apply to affected area two times a day  30 g 0   No current facility-administered medications for this visit.    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 ([facility])   BREAST BIOPSY Left 03/08/2023   MM LT BREAST BX W LOC DEV 1ST LESION IMAGE BX SPEC STEREO GUIDE 03/08/2023 [facility]   BREAST SURGERY     COLONOSCOPY     TUBAL LIGATION     VSD REPAIR  1996    Family History  Problem Relation Age of Onset   Hypertension Mother    Heart attack Father 23   Cancer Maternal Grandmother        Uterine Cancer   Stomach cancer Paternal Grandmother 60   Colon cancer Neg Hx    Esophageal cancer Neg Hx     Social History   Tobacco Use   Smoking status: Never   Smokeless tobacco: Never  Substance Use Topics  Alcohol use: No    Subjective:   Patient had called with left breast pain earlier this month- as a result, diagnostic mammogram was ordered; per patient, the symptoms had resolved by time of mammogram- "only lasted 3-4 days"; however, feels that pain has returned in the past week; no rash or discharge; just persisting pain in left breast; did have area in left breast biopsied last year due to calcifications;   Objective:  Vitals:   03/18/24 1423 03/18/24 1552  BP: (!) 140/80 138/80  Pulse: (!) 57   Temp: 98 F (36.7 C)   TempSrc: Oral   SpO2: 95%   Weight: 134 lb (60.8 kg)   Height: 5\' 1"  (1.549 m)     General: Well developed, well  nourished, in no acute distress  Skin : Warm and dry.  Head: Normocephalic and atraumatic  Lungs: Respirations unlabored;  Neurologic: Alert and oriented; speech intact; face symmetrical; moves all extremities well; CNII-XII intact without focal deficit  Left breast exam- cystic type lesion noted at 3:00 position  Assessment:  1. Breast pain, left   2. Hypothyroidism following radioiodine therapy     Plan:  Will go ahead and treat for possible cyst; physical exam is concerning for cystic lesion; will refer to breast surgeon to discuss further imaging and treatment options; she just had a normal diagnostic mammogram earlier this month;  Update TSH today; will adjust medication accordingly;   No follow-ups on file.  Orders Placed This Encounter  Procedures   Thyroid  Panel With TSH   Ambulatory referral to General Surgery    Referral Priority:   Routine    Referral Type:   Surgical    Referral Reason:   Specialty Services Required    Requested Specialty:   General Surgery    Number of Visits Requested:   1    Requested Prescriptions   Signed Prescriptions Disp Refills   cephALEXin  (KEFLEX ) 500 MG capsule 15 capsule 0    Sig: Take 1 capsule (500 mg total) by mouth 3 (three) times daily for 5 days.

## 2024-03-18 NOTE — Progress Notes (Signed)
 Subjective:   Mia Newman is a 78 y.o. who presents for a Medicare Wellness preventive visit.  As a reminder, Annual Wellness Visits don't include a physical exam, and some assessments may be limited, especially if this visit is performed virtually. We may recommend an in-person follow-up visit with your provider if needed.  Visit Complete: Virtual I connected with  Erminio Hazy Kulig on 03/18/24 by a audio enabled telemedicine application and verified that I am speaking with the correct person using two identifiers.  Patient Location: Home  Provider Location: Home Office  I discussed the limitations of evaluation and management by telemedicine. The patient expressed understanding and agreed to proceed.  Vital Signs: Because this visit was a virtual/telehealth visit, some criteria may be missing or patient reported. Any vitals not documented were not able to be obtained and vitals that have been documented are patient reported.  VideoDeclined- This patient declined Librarian, academic. Therefore the visit was completed with audio only.  Persons Participating in Visit: Patient.  AWV Questionnaire: No: Patient Medicare AWV questionnaire was not completed prior to this visit.  Cardiac Risk Factors include: advanced age (>22men, >24 women);dyslipidemia     Objective:     Today's Vitals   03/18/24 0935  Weight: 133 lb (60.3 kg)  Height: 5\' 1"  (1.549 m)   Body mass index is 25.13 kg/m.     03/18/2024    9:52 AM 02/06/2023    3:03 PM 02/03/2022    3:05 PM 12/23/2021    3:22 PM 12/20/2019    9:28 AM 08/07/2018    1:46 PM 09/25/2017   10:54 AM  Advanced Directives  Does Patient Have a Medical Advance Directive? No Yes Yes Yes Yes No No  Type of Special educational needs teacher of [place];Living will Healthcare Power of [place];Out of facility DNR (pink MOST or yellow form);Living will Healthcare Power of [place];Living will Healthcare Power of  Attorney    Does patient want to make changes to medical advance directive?  No - Patient declined  Yes (MAU/Ambulatory/Procedural Areas - Information given)     Copy of Healthcare Power of Attorney in Chart?  No - copy requested No - copy requested No - copy requested     Would patient like information on creating a medical advance directive? Yes (MAU/Ambulatory/Procedural Areas - Information given)          Current Medications (verified) Outpatient Encounter Medications as of 03/18/2024  Medication Sig   amLODipine  (NORVASC ) 2.5 MG tablet Take 1 tablet (2.5 mg total) by mouth daily.   Ascorbic Acid (VITAMIN C) 1000 MG tablet Take 1,000 mg by mouth daily.   calcium  carbonate (OSCAL) 1500 (600 Ca) MG TABS tablet Take 600 mg of elemental calcium  by mouth 2 (two) times daily with a meal.   Cholecalciferol (VITAMIN D-3) 125 MCG (5000 UT) TABS Take by mouth.   famotidine  (PEPCID ) 20 MG tablet Take 1 tablet (20 mg total) by mouth 2 (two) times daily.   IBUPROFEN PO Take by mouth.   latanoprost (XALATAN) 0.005 % ophthalmic solution Place 1 drop into both eyes at bedtime.   levothyroxine  (SYNTHROID ) 50 MCG tablet Take 1 tablet (50 mcg total) by mouth daily.   rosuvastatin  (CRESTOR ) 5 MG tablet Take 1 tablet (5 mg total) by mouth daily.   triamcinolone  cream (KENALOG ) 0.1 % Apply 1 Application topically 2 (two) times daily. Apply to affected area two times a day   nitrofurantoin , macrocrystal-monohydrate, (MACROBID ) 100 MG capsule  Take 1 capsule (100 mg total) by mouth 2 (two) times daily. (Patient not taking: Reported on 03/18/2024)   No facility-administered encounter medications on file as of 03/18/2024.    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 ([facility])   BREAST BIOPSY Left 03/08/2023   MM LT BREAST BX W LOC DEV 1ST LESION IMAGE BX SPEC STEREO GUIDE 03/08/2023 [facility]   BREAST SURGERY     COLONOSCOPY     TUBAL LIGATION     VSD REPAIR  1996   Family History  Problem Relation Age of Onset   Hypertension Mother    Heart attack Father 61   Cancer Maternal Grandmother        Uterine Cancer   Stomach cancer Paternal Grandmother 100   Colon cancer Neg Hx    Esophageal cancer Neg Hx    Social History   Socioeconomic History   Marital status: Married    Spouse name: Not on file   Number of children: Not on file   Years of education: Not on file   Highest education level: Not on file  Occupational History   Occupation: Retired  Tobacco Use   Smoking status: Never   Smokeless tobacco: Never  Vaping Use   Vaping status: Never Used  Substance and Sexual Activity   Alcohol use: No   Drug use: No   Sexual activity: Yes    Birth control/protection: None  Other Topics Concern   Not on file  Social History Narrative   Not on file   Social Drivers of Health   Financial Resource Strain: Low Risk  (03/18/2024)   Overall Financial Resource Strain (CARDIA)    Difficulty of Paying Living Expenses: Not hard at all  Food Insecurity: No Food Insecurity (03/18/2024)   Hunger Vital Sign    Worried About Running Out of Food in the Last Year: Never true    Ran Out of Food in the Last Year: Never true  Transportation Needs: No Transportation Needs (03/18/2024)   PRAPARE - Administrator, Civil Service (Medical): No    Lack of Transportation (Non-Medical): No   Physical Activity: Inactive (03/18/2024)   Exercise Vital Sign    Days of Exercise per Week: 0 days    Minutes of Exercise per Session: 0 min  Stress: No Stress Concern Present (03/18/2024)   [employer] of Occupational Health - Occupational Stress Questionnaire    Feeling of Stress : Not at all  Social Connections: Socially Integrated (03/18/2024)   Social Connection and Isolation Panel [NHANES]    Frequency of Communication with Friends and Family: More than three times a week    Frequency of Social Gatherings with Friends and Family: More than three times a week    Attends Religious Services: More than 4 times per year    Active Member of [employer] or Organizations: Yes    Attends [employer]  or Organization Meetings: More than 4 times per year    Marital Status: Married    Tobacco Counseling Counseling given: Not Answered    Clinical Intake:  Pre-visit preparation completed: Yes  Pain : No/denies pain     Diabetes: No  Lab Results  Component Value Date   HGBA1C 6.0 (H) 12/21/2023   HGBA1C 5.9 (H) 06/06/2023   HGBA1C 5.4 08/07/2018     How often do you need to have someone help you when you read instructions, pamphlets, or other written materials from your doctor or pharmacy?: 1 - Never  Interpreter Needed?: No  Information entered by :: Seabron Cypress LPN   Activities of Daily Living     03/18/2024    9:52 AM  In your present state of health, do you have any difficulty performing the following activities:  Hearing? 0  Vision? 0  Difficulty concentrating or making decisions? 0  Walking or climbing stairs? 0  Dressing or bathing? 0  Doing errands, shopping? 0  Preparing Food and eating ? N  Using the Toilet? N  In the past six months, have you accidently leaked urine? N  Do you have problems with loss of bowel control? N  Managing your Medications? N  Managing your Finances? N  Housekeeping or managing your Housekeeping? N    Patient Care Team: Adra Alanis, FNP as PCP - General (Internal Medicine) Pauline Bos, OD as Referring Physician (Optometry) Szabat, Tino Foreman, [facility] (Inactive) as Pharmacist (Pharmacist)  Indicate any recent Medical Services you may have received from other than [facility] providers in the past year (date may be approximate).     Assessment:    This is a routine wellness examination for [place].  Hearing/Vision screen Hearing Screening - Comments:: Denies hearing difficulties   Vision Screening - Comments:: Wears rx glasses - up to date with routine eye exams with Dr. Joanne Muckle    Goals Addressed             This Visit's Progress    Remain active and independent   On track      Depression Screen     03/18/2024    9:51 AM 02/06/2023    3:05 PM 10/09/2022    2:27 PM 08/29/2022   11:06 AM 02/03/2022    3:06 PM 12/23/2021    2:56 PM 04/05/2021    1:18 PM  PHQ 2/9 Scores  PHQ - 2 Score 0 0 0 0 0 0 0    Fall Risk     03/18/2024    9:52 AM 02/06/2023    3:03 PM 10/09/2022    2:27 PM 08/29/2022   11:06 AM 02/03/2022    3:05 PM  Fall Risk   Falls in the past year? 0 1 0 1 0  Comment  tripped in a walking trail     Number falls in past yr: 0 0 0 0 0  Injury with Fall? 0 0 0 1 0  Risk for fall due to : No Fall Risks No Fall Risks No Fall Risks Impaired balance/gait;History of fall(s) No Fall Risks  Follow up Falls prevention discussed;Education provided;Falls evaluation completed Falls evaluation completed Falls evaluation completed Falls evaluation completed;Falls prevention discussed Falls evaluation completed    MEDICARE RISK AT HOME:  Medicare Risk at Home Any stairs in or around the home?: No If so, are there any without handrails?: No Home free of loose throw rugs in walkways, pet beds, electrical cords, etc?: Yes Adequate lighting in  your home to reduce risk of falls?: Yes Life alert?: No Use of a cane, walker or w/c?: No Grab bars in the bathroom?: Yes Shower chair or bench in shower?:  Yes Elevated toilet seat or a handicapped toilet?: Yes  TIMED UP AND GO:  Was the test performed?  No  Cognitive Function: 6CIT completed        03/18/2024    9:52 AM 02/06/2023    3:08 PM 02/03/2022    3:09 PM  6CIT Screen  What Year? 0 points 0 points 0 points  What month? 0 points 0 points 0 points  What time? 0 points 0 points 0 points  Count back from 20 0 points 0 points 0 points  Months in reverse 0 points 0 points 2 points  Repeat phrase 0 points 10 points 10 points  Total Score 0 points 10 points 12 points    Immunizations Immunization History  Administered Date(s) Administered   PFIZER(Purple Top)SARS-COV-2 Vaccination 01/01/2020, 01/26/2020, 08/21/2020   Pneumococcal Conjugate-13 09/02/2013   Pneumococcal Polysaccharide-23 06/04/2017   Td 12/24/2001   Tdap 03/05/2020   Zoster Recombinant(Shingrix) 10/30/2020, 06/03/2021    Screening Tests Health Maintenance  Topic Date Due   COVID-19 Vaccine (4 - 2024-25 season) 06/24/2023   INFLUENZA VACCINE  05/23/2024   Medicare Annual Wellness (AWV)  03/18/2025   Colonoscopy  02/08/2026   DTaP/Tdap/Td (3 - Td or Tdap) 03/05/2030   Pneumonia Vaccine 35+ Years old  Completed   DEXA SCAN  Completed   Hepatitis C Screening  Completed   Zoster Vaccines- Shingrix  Completed   HPV VACCINES  Aged Out   Meningococcal B Vaccine  Aged Out    Health Maintenance  Health Maintenance Due  Topic Date Due   COVID-19 Vaccine (4 - 2024-25 season) 06/24/2023    Additional Screening:  Vision Screening: Recommended annual ophthalmology exams for early detection of glaucoma and other disorders of the eye.  Dental Screening: Recommended annual dental exams for proper oral hygiene  Community Resource Referral / Chronic Care Management: CRR required this visit?  No   CCM required this visit?  No   Plan:    I have personally reviewed and noted the following in the patient's chart:   Medical and social history Use of  alcohol, tobacco or illicit drugs  Current medications and supplements including opioid prescriptions. Patient is not currently taking opioid prescriptions. Functional ability and status Nutritional status Physical activity Advanced directives List of other physicians Hospitalizations, surgeries, and ER visits in previous 12 months Vitals Screenings to include cognitive, depression, and falls Referrals and appointments  In addition, I have reviewed and discussed with patient certain preventive protocols, quality metrics, and best practice recommendations. A written personalized care plan for preventive services as well as general preventive health recommendations were provided to patient.   Seabron Cypress [place], [place]   1/91/4782   After Visit Summary: (MyChart) Due to this being a telephonic visit, the after visit summary with patients personalized plan was offered to patient via MyChart   Notes: Nothing significant to report at this time.

## 2024-03-20 ENCOUNTER — Other Ambulatory Visit: Payer: Self-pay | Admitting: Family

## 2024-03-20 ENCOUNTER — Ambulatory Visit: Payer: Self-pay | Admitting: Family

## 2024-03-20 LAB — THYROID PANEL WITH TSH
Free Thyroxine Index: 2.4 (ref 1.4–3.8)
T3 Uptake: 30 % (ref 22–35)
T4, Total: 8.1 ug/dL (ref 5.1–11.9)
TSH: 2.14 m[IU]/L (ref 0.40–4.50)

## 2024-03-20 MED ORDER — LEVOTHYROXINE SODIUM 50 MCG PO TABS: 50.0000 ug | ORAL_TABLET | Freq: Every day | ORAL | 3 refills | Status: DC

## 2024-03-21 ENCOUNTER — Ambulatory Visit: Payer: Medicare (Managed Care) | Admitting: Family

## 2024-03-24 ENCOUNTER — Telehealth: Payer: Self-pay

## 2024-03-24 NOTE — Telephone Encounter (Signed)
 Copied from CRM [id number]. Topic: Clinical - Request for Lab/Test Order >> Mar 24, 2024 11:58 AM Artemio Larry wrote: Reason for CRM: Patient says Mia Newman wants her to come in June 4th for blood work - please call patient to schedule lab appt once orders are in and she would prefer to have labs done in [place] location. [phone]

## 2024-03-24 NOTE — Telephone Encounter (Signed)
 Please see result note, PCP is requesting repeat labs be done in 6 months, will call pt back to advise pt.

## 2024-03-25 NOTE — Telephone Encounter (Signed)
 Pt would like labs drawn at [facility] location, PCP is aware.

## 2024-03-26 ENCOUNTER — Other Ambulatory Visit: Payer: Medicare (Managed Care)

## 2024-04-10 ENCOUNTER — Ambulatory Visit: Payer: Self-pay

## 2024-04-10 ENCOUNTER — Ambulatory Visit (INDEPENDENT_AMBULATORY_CARE_PROVIDER_SITE_OTHER): Payer: Medicare (Managed Care) | Admitting: Family

## 2024-04-10 ENCOUNTER — Encounter: Payer: Self-pay | Admitting: Family

## 2024-04-10 VITALS — BP 130/76 | HR 58 | Ht 61.0 in | Wt 134.0 lb

## 2024-04-10 DIAGNOSIS — B009 Herpesviral infection, unspecified: Secondary | ICD-10-CM

## 2024-04-10 MED ORDER — PREDNISONE 20 MG PO TABS: 20.0000 mg | ORAL_TABLET | Freq: Every day | ORAL | 0 refills | Status: DC

## 2024-04-10 MED ORDER — VALACYCLOVIR HCL 500 MG PO TABS: 500.0000 mg | ORAL_TABLET | Freq: Two times a day (BID) | ORAL | 0 refills | Status: DC

## 2024-04-10 NOTE — Progress Notes (Signed)
 Mia Newman is a 78 y.o. 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 90 tablet 3   Ascorbic Acid (VITAMIN C) 1000 MG tablet Take 1,000 mg by mouth daily.     calcium  carbonate (OSCAL) 1500 (600 Ca) MG TABS tablet Take 600 mg of elemental calcium  by mouth 2 (two) times daily with a meal.     Cholecalciferol (VITAMIN D-3) 125 MCG (5000 UT) TABS Take by mouth.     famotidine  (PEPCID ) 20 MG tablet Take 1 tablet (20 mg total) by mouth 2 (two) times daily.     IBUPROFEN PO Take by mouth.     latanoprost (XALATAN) 0.005 % ophthalmic solution Place 1 drop into both eyes at bedtime.     levothyroxine  (SYNTHROID ) 50 MCG tablet Take 1 tablet (50 mcg total) by mouth daily. 90 tablet 3   predniSONE  (DELTASONE ) 20 MG tablet Take 1 tablet (20 mg total) by mouth daily with breakfast. 5 tablet 0   rosuvastatin  (CRESTOR ) 5 MG tablet Take 1 tablet (5 mg total) by mouth daily. 90 tablet 3   triamcinolone  cream (KENALOG ) 0.1 % Apply 1 Application topically 2 (two) times daily. Apply to affected area two times a day 30 g 0   valACYclovir (VALTREX) 500 MG tablet Take 1 tablet (500 mg total) by mouth 2 (two) times daily. 10 tablet 0   No current facility-administered  medications for this visit.    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 ([facility])   BREAST BIOPSY Left 03/08/2023   MM LT BREAST BX W LOC DEV 1ST LESION IMAGE BX SPEC STEREO GUIDE 03/08/2023 [facility]   BREAST SURGERY     COLONOSCOPY     TUBAL LIGATION     VSD REPAIR  1996    Family History  Problem Relation Age of Onset   Hypertension Mother    Heart attack Father 43   Cancer Maternal Grandmother        Uterine Cancer   Stomach cancer Paternal Grandmother 1   Colon cancer Neg Hx    Esophageal cancer Neg Hx     Social History   Tobacco Use   Smoking status: Never   Smokeless tobacco: Never  Substance Use Topics   Alcohol use: No  Subjective:   Concerned about 2 day history of top lip swelling- no difficulty breathing or swallowing; does feel like symptoms related to use of Blistex; did notice cold sore on top lip at onset of symptoms; feels like lips are dry, burning;    Objective:  Vitals:   04/10/24 1415  BP: 130/76  Pulse: (!) 58  SpO2: 98%  Weight: 134 lb (60.8 kg)  Height: 5' 1 (1.549 m)    General: Well developed, well nourished, in no acute distress  Skin : Warm and dry. Small area c/w small cold sore noted on top left lip; no obvious swelling noted of lips;  Head: Normocephalic and atraumatic  Eyes: Sclera and conjunctiva clear; pupils round and reactive to light;  extraocular movements intact  Ears: External normal; canals clear; tympanic membranes normal  Oropharynx: Pink, supple. No suspicious lesions  Neck: Supple without thyromegaly, adenopathy  Lungs: Respirations unlabored; clear to auscultation bilaterally without wheeze, rales, rhonchi  Neurologic: Alert and oriented; speech intact; face symmetrical; moves all extremities well; CNII-XII intact without focal deficit   Assessment:  1. HSV-1 infection     Plan:  ?  Reaction to OTC topical treatment applied to treat the cold sore itself; physical exam is reassuring; patient has already thrown out her old topical medication that she was using to treat the sore; will treat with combination of Valtrex and Prednisone ; she can also apply OTC Aquaphor to help with dryness; follow up worse, no better.   No follow-ups on file.  No orders of the defined types were placed in this encounter.   Requested Prescriptions   Signed Prescriptions Disp Refills   predniSONE  (DELTASONE ) 20 MG tablet 5 tablet 0    Sig: Take 1 tablet (20 mg total) by mouth daily with breakfast.   valACYclovir (VALTREX) 500 MG tablet 10 tablet 0    Sig: Take 1 tablet (500 mg total) by mouth 2 (two) times daily.

## 2024-04-10 NOTE — Telephone Encounter (Signed)
   FYI Only or Action Required?: FYI only for provider.  Patient was last seen in primary care on 03/18/2024 by Adra Alanis, FNP. Called Nurse Triage reporting No chief complaint on file.. Symptoms began several days ago. Interventions attempted: Nothing. Symptoms are: unchanged.  Triage Disposition: See PCP When Office is Open (Within 3 Days)  Patient/caregiver understands and will follow disposition?: Yes                          Copied from CRM [id number]. Topic: Clinical - Red Word Triage >> Apr 10, 2024 10:36 AM Oddis Bench wrote: Red Word that prompted transfer to Nurse Triage: Patient is calling bc her lips are red, burning she was applying blistex and now they are a little tingly also swollen. Reason for Disposition  [1] Swelling in mouth AND [2] unexplained  Answer Assessment - Initial Assessment Questions 1. ONSET: When did the mouth start hurting? (Hours or days ago)      Tingling on lips with swelling was using blistex. Three or four days 2. LOCATION:  Where is the pain? (What part of the mouth?)     lips 3. SEVERITY: How bad is the pain?     - MILD: doesn't interfere with eating or normal activities    - MODERATE: interferes with eating some solids and normal activities    - SEVERE PAIN: excruciating pain, interferes with most normal activities    - SEVERE DYSPHAGIA: can't swallow liquids, drooling     Mild, lips red 4. ULCERS or SORES: Are there any ulcers or sores in the mouth? If so, ask: What part of the mouth are the ulcers in?     Swollen but no sores 5. FEVER: Does your child have a fever? If so, ask: What is it?, How was it measured? and When did it start?      no 6. CAUSE: What do you think is causing the mouth pain?     States did use new soap.  Protocols used: Mouth Pain and Other Symptoms-P-AH

## 2024-04-10 NOTE — Patient Instructions (Signed)
 For the dryness on your lips, please purchase OTC Aquaphor to help moisturize the lips;

## 2024-04-13 ENCOUNTER — Other Ambulatory Visit: Payer: Self-pay | Admitting: Internal Medicine

## 2024-04-14 ENCOUNTER — Other Ambulatory Visit: Payer: Self-pay

## 2024-04-14 MED ORDER — AMLODIPINE BESYLATE 2.5 MG PO TABS: 2.5000 mg | ORAL_TABLET | Freq: Every day | ORAL | 3 refills | Status: DC

## 2024-04-28 ENCOUNTER — Telehealth: Payer: Self-pay

## 2024-04-28 NOTE — Telephone Encounter (Signed)
 Copied from CRM [id number]. Topic: Referral - Question >> Apr 28, 2024  2:24 PM Aisha D wrote: Reason for CRM: Pt stated that she seen Leita Jason BRISTLE, on 5/27 regarding her breast. Pt stated that she was told she would have a referral for the breast center but hasn't heard anything regarding the referral. Pt would like a callback today if possible regarding this concern.

## 2024-04-28 NOTE — Telephone Encounter (Signed)
 Please advise- it looks like Pt [facility] in [facility].

## 2024-05-12 ENCOUNTER — Telehealth: Payer: Self-pay

## 2024-05-12 NOTE — Telephone Encounter (Signed)
 Copied from CRM [id number]. Topic: General - Other >> May 12, 2024 12:46 PM Mia Newman wrote: Reason for CRM: Pt stated that she has an appt scheduled on 8/4 at [facility]. Pt stated that she is no longer experiencing the pain on her breast and was wondering if she should still keep the appt. Pt would like a callback with an update on this concern.

## 2024-05-19 ENCOUNTER — Ambulatory Visit (HOSPITAL_COMMUNITY)
Admission: EM | Admit: 2024-05-19 | Discharge: 2024-05-19 | Disposition: A | Discharge status: Home / Self Care | Payer: Medicare (Managed Care) | Attending: Internal Medicine | Admitting: Internal Medicine

## 2024-05-19 ENCOUNTER — Encounter (HOSPITAL_COMMUNITY): Payer: Self-pay

## 2024-05-19 DIAGNOSIS — L239 Allergic contact dermatitis, unspecified cause: Secondary | ICD-10-CM | POA: Diagnosis not present

## 2024-05-19 MED ORDER — TRIAMCINOLONE ACETONIDE 0.1 % EX CREA: 1.0000 | TOPICAL_CREAM | Freq: Two times a day (BID) | CUTANEOUS | 1 refills | Status: AC

## 2024-05-19 MED ORDER — PREDNISONE 20 MG PO TABS
40.0000 mg | ORAL_TABLET | Freq: Every day | ORAL | 0 refills | Status: AC
Start: 2024-05-19 — End: 2024-05-24

## 2024-05-19 NOTE — Discharge Instructions (Addendum)
 Symptoms and physical exam findings are consistent with allergic dermatitis likely secondary to poison ivy or poison oak.  Given the extent we will treat with the following: Prednisone  40 mg (2 tablets) once daily for 5 days. Take this in the morning.  This is a steroid to help with inflammation and pain.  Do not take ibuprofen while you are taking this medication.  It is okay to take Tylenol . Triamcinolone  cream twice daily to the affected area for 7 to 10 days.  May use this daily as needed after this for itching/rash.  Do not apply this to the neck or face.  Return to urgent care or PCP if symptoms worsen or fail to resolve.

## 2024-05-19 NOTE — ED Triage Notes (Signed)
 Pt c/o rash itchy rash to rt forearm x1wk after working in the yard. States was on lt arm but its gone. States using hydrocortisone with relief with itching.

## 2024-05-19 NOTE — ED Provider Notes (Signed)
 [facility]    CSN: [id number] Arrival date & time: 05/19/24  1431      History   Chief Complaint Chief Complaint  Patient presents with   Rash    HPI Mia Newman is a 78 y.o. female.   78 year old female who presents urgent care with complaints of a rash on her right arm.  About a week ago she was working in her yard and pulling up weeds.  She thinks she may have gotten into poison ivy or poison oak.  Initially she had a rash on both arms with the left arm has resolved.  She is using hydrocortisone cream.  She reports that the area itches at times and at other times it does not.  She reports that the left arm has gotten worse over the last couple of days.  She denies any shortness of breath or swelling in the mouth or tongue.   Rash Associated symptoms: no abdominal pain, no fever, no joint pain, no shortness of breath, no sore throat and not vomiting     Past Medical History:  Diagnosis Date   Allergy    Arthritis    ASYMPTOMATIC POSTMENOPAUSAL STATUS 03/03/2010   DIVERTICULITIS-COLON 05/22/2008   DIVERTICULOSIS, COLON 05/20/2008   DM 04/22/2008   borderline   Glaucoma    Hyperthyroidism    Irritable bowel syndrome    Personal history of colonic polyp - adenoma 03/03/2014   Pure hypercholesterolemia 09/27/2007   Unspecified hypothyroidism 04/22/2008    Patient Active Problem List   Diagnosis Date Noted   Periumbilical abdominal tenderness without rebound tenderness 07/24/2019   Diverticulitis of large intestine without perforation or abscess without bleeding 07/24/2019   Hyperglycemia 08/07/2018   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   Left rotator cuff tear 06/19/2017   Vaginitis and vulvovaginitis 06/09/2014   Meningioma (HCC) 09/02/2013   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      Home Medications    Prior to Admission medications   Medication Sig Start Date End Date Taking? Authorizing Provider  predniSONE  (DELTASONE ) 20 MG tablet Take 2 tablets (40 mg total) by mouth daily with breakfast for 5 days. 05/19/24 05/24/24 Yes Johnnisha Forton A, PA-C  triamcinolone  cream (KENALOG ) 0.1 % Apply 1 Application topically 2 (two) times daily. 05/19/24  Yes Aviona Martenson A, PA-C  amLODipine  (NORVASC ) 2.5 MG tablet Take 1 tablet (2.5 mg total) by mouth daily. 04/14/24   Okey Vina GAILS, MD  Ascorbic Acid (VITAMIN C) 1000 MG tablet Take 1,000 mg by mouth daily.    [provider]  calcium  carbonate (OSCAL) 1500 (600 Ca) MG TABS tablet Take 600 mg of elemental calcium  by mouth 2 (two) times daily with a meal.    [provider]  Cholecalciferol (VITAMIN D-3) 125 MCG (5000 UT) TABS Take by mouth.    [provider]  famotidine  (PEPCID ) 20  MG tablet Take 1 tablet (20 mg total) by mouth 2 (two) times daily. 05/24/23   Jason Leita Repine, FNP  IBUPROFEN PO Take by mouth.    [provider]  latanoprost (XALATAN) 0.005 % ophthalmic solution Place 1 drop into both eyes at bedtime.    [provider]  levothyroxine  (SYNTHROID ) 50 MCG tablet Take 1 tablet (50 mcg total) by mouth daily. 03/20/24   Jason Leita Repine, FNP  rosuvastatin  (CRESTOR ) 5 MG tablet Take 1 tablet (5 mg total) by mouth daily. 06/27/23   Okey Vina GAILS, MD  valACYclovir  (VALTREX ) 500 MG tablet Take 1 tablet (500 mg total) by mouth 2 (two) times daily. 04/10/24   Jason Leita Repine, FNP     Family History Family History  Problem Relation Age of Onset   Hypertension Mother    Heart attack Father 77   Cancer Maternal Grandmother        Uterine Cancer   Stomach cancer Paternal Grandmother 92   Colon cancer Neg Hx    Esophageal cancer Neg Hx     Social History Social History   Tobacco Use   Smoking status: Never   Smokeless tobacco: Never  Vaping Use   Vaping status: Never Used  Substance Use Topics   Alcohol use: No   Drug use: No     Allergies   Atorvastatin, Raloxifene, Simvastatin, and Sulfamethoxazole   Review of Systems Review of Systems  Constitutional:  Negative for chills and fever.  HENT:  Negative for ear pain and sore throat.   Eyes:  Negative for pain and visual disturbance.  Respiratory:  Negative for cough and shortness of breath.   Cardiovascular:  Negative for chest pain and palpitations.  Gastrointestinal:  Negative for abdominal pain and vomiting.  Genitourinary:  Negative for dysuria and hematuria.  Musculoskeletal:  Negative for arthralgias and back pain.  Skin:  Positive for rash. Negative for color change.  Neurological:  Negative for seizures and syncope.  All other systems reviewed and are negative.    Physical Exam Triage Vital Signs ED Triage Vitals  Encounter Vitals Group     BP 05/19/24 1706 (!) 155/85     Girls Systolic BP Percentile --      Girls Diastolic BP Percentile --      Boys Systolic BP Percentile --      Boys Diastolic BP Percentile --      Pulse Rate 05/19/24 1706 61     Resp 05/19/24 1706 18     Temp 05/19/24 1706 98.3 F (36.8 C)     Temp Source 05/19/24 1706 Oral     SpO2 05/19/24 1706 97 %     Weight --      Height --      Head Circumference --      Peak Flow --      Pain Score 05/19/24 1707 0     Pain Loc --      Pain Education --      Exclude from Growth Chart --    No data found.  Updated Vital Signs BP (!) 155/85 (BP Location: Right Arm)   Pulse 61   Temp 98.3 F (36.8 C)  (Oral)   Resp 18   SpO2 97%   Visual Acuity Right Eye Distance:   Left Eye Distance:   Bilateral Distance:    Right Eye Near:   Left Eye Near:    Bilateral Near:     Physical Exam Vitals and nursing  note reviewed.  Constitutional:      General: She is not in acute distress.    Appearance: She is well-developed.  HENT:     Head: Normocephalic and atraumatic.  Eyes:     Conjunctiva/sclera: Conjunctivae normal.  Cardiovascular:     Rate and Rhythm: Normal rate and regular rhythm.     Heart sounds: No murmur heard. Pulmonary:     Effort: Pulmonary effort is normal. No respiratory distress.     Breath sounds: Normal breath sounds.  Abdominal:     Palpations: Abdomen is soft.     Tenderness: There is no abdominal tenderness.  Musculoskeletal:        General: No swelling.     Cervical back: Neck supple.  Skin:    General: Skin is warm and dry.     Capillary Refill: Capillary refill takes less than 2 seconds.     Findings: Rash present. Rash is macular and urticarial.      Neurological:     Mental Status: She is alert.  Psychiatric:        Mood and Affect: Mood normal.      UC Treatments / Results  Labs (all labs ordered are listed, but only abnormal results are displayed) Labs Reviewed - No data to display  EKG   Radiology No results found.  Procedures Procedures (including critical care time)  Medications Ordered in UC Medications - No data to display  Initial Impression / Assessment and Plan / UC Course  I have reviewed the triage vital signs and the nursing notes.  Pertinent labs & imaging results that were available during my care of the patient were reviewed by me and considered in my medical decision making (see chart for details).     Allergic dermatitis   Symptoms and physical exam findings are consistent with allergic dermatitis likely secondary to poison ivy or poison oak.  Given the extent we will treat with the following: Prednisone  40 mg  (2 tablets) once daily for 5 days. Take this in the morning.  This is a steroid to help with inflammation and pain.  Do not take ibuprofen while you are taking this medication.  It is okay to take Tylenol . Triamcinolone  cream twice daily to the affected area for 7 to 10 days.  May use this daily as needed after this for itching/rash.  Do not apply this to the neck or face.  Return to urgent care or PCP if symptoms worsen or fail to resolve.  Final Clinical Impressions(s) / UC Diagnoses   Final diagnoses:  Allergic dermatitis     Discharge Instructions      Symptoms and physical exam findings are consistent with allergic dermatitis likely secondary to poison ivy or poison oak.  Given the extent we will treat with the following: Prednisone  40 mg (2 tablets) once daily for 5 days. Take this in the morning.  This is a steroid to help with inflammation and pain.  Do not take ibuprofen while you are taking this medication.  It is okay to take Tylenol . Triamcinolone  cream twice daily to the affected area for 7 to 10 days.  May use this daily as needed after this for itching/rash.  Do not apply this to the neck or face.  Return to urgent care or PCP if symptoms worsen or fail to resolve.      ED Prescriptions     Medication Sig Dispense Auth. Provider   predniSONE  (DELTASONE ) 20 MG tablet Take 2 tablets (40  mg total) by mouth daily with breakfast for 5 days. 10 tablet Teresa Norris A, PA-C   triamcinolone  cream (KENALOG ) 0.1 % Apply 1 Application topically 2 (two) times daily. 80 g Teresa Norris LABOR, [place]      PDMP not reviewed this encounter.   Teresa Norris LABOR, PA-C 05/19/24 1739

## 2024-05-20 NOTE — Telephone Encounter (Signed)
LMOM informing of PCP recommendations.  

## 2024-05-26 DIAGNOSIS — N644 Mastodynia: Secondary | ICD-10-CM | POA: Diagnosis not present

## 2024-07-12 ENCOUNTER — Other Ambulatory Visit: Payer: Self-pay | Admitting: Internal Medicine

## 2024-08-06 ENCOUNTER — Other Ambulatory Visit: Payer: Self-pay | Admitting: Internal Medicine

## 2024-08-11 ENCOUNTER — Ambulatory Visit: Payer: Medicare (Managed Care) | Admitting: Family Medicine

## 2024-08-12 ENCOUNTER — Ambulatory Visit: Payer: Medicare (Managed Care) | Admitting: Family Medicine

## 2024-08-26 ENCOUNTER — Other Ambulatory Visit: Payer: Self-pay | Admitting: Internal Medicine

## 2024-08-26 ENCOUNTER — Other Ambulatory Visit: Payer: Self-pay | Admitting: Family Medicine

## 2024-08-26 NOTE — Telephone Encounter (Signed)
 Copied from CRM [id number]. Topic: Clinical - Medication Refill >> Aug 26, 2024  2:10 PM Leah W wrote: Medication:  rosuvastatin  (CRESTOR ) 5 MG tablet  Has the patient contacted their pharmacy? No (Agent: If no, request that the patient contact the pharmacy for the refill. If patient does not wish to contact the pharmacy document the reason why and proceed with request.) (Agent: If yes, when and what did the pharmacy advise?)  This is the patient's preferred pharmacy:  [facility] [address], [place] - [address] [place] [address] Phone: [phone] Fax: [phone]  Is this the correct pharmacy for this prescription? Yes If no, delete pharmacy and type the correct one.   Has the prescription been filled recently? No  Is the patient out of the medication? Yes  Has the patient been seen for an appointment in the last year OR does the patient have an upcoming appointment? Yes  Can we respond through MyChart? No  Agent: Please be advised that Rx refills may take up to 3 business days. We ask that you follow-up with your pharmacy.

## 2024-08-26 NOTE — Telephone Encounter (Signed)
 Error CRM sent. Crestor  is prescribed by Dr. Okey at cardiology.

## 2024-09-25 ENCOUNTER — Telehealth: Payer: Self-pay | Admitting: Internal Medicine

## 2024-09-25 MED ORDER — ROSUVASTATIN CALCIUM 5 MG PO TABS: 5.0000 mg | ORAL_TABLET | Freq: Every day | ORAL | 0 refills | Status: DC

## 2024-09-25 NOTE — Telephone Encounter (Signed)
 *  STAT* If patient is at the pharmacy, call can be transferred to refill team.   1. Which medications need to be refilled? (please list name of each medication and dose if known) rosuvastatin  (CRESTOR ) 5 MG tablet    2. Would you like to learn more about the convenience, safety, & potential cost savings by using the [facility]? No     3. Are you open to using the [facility] (Type [facility]. ). No    4. Which pharmacy/location (including street and city if local pharmacy) is medication to be sent to?  [facility] - [place], [place] - [address]     5. Do they need a 30 day or 90 day supply? 30 days    Patient is out of meds for a week. Patient made an appt with Dr. Okey on 12/11 at 9:20 am

## 2024-09-25 NOTE — Telephone Encounter (Signed)
 Requested Prescriptions   Signed Prescriptions Disp Refills   rosuvastatin  (CRESTOR ) 5 MG tablet 30 tablet 0    Sig: Take 1 tablet (5 mg total) by mouth daily.    Authorizing Provider: OKEY VINA GAILS    Ordering User: Jaleyah Longhi  C

## 2024-09-30 ENCOUNTER — Other Ambulatory Visit: Payer: Self-pay | Admitting: Internal Medicine

## 2024-09-30 ENCOUNTER — Encounter: Payer: Self-pay | Admitting: Pharmacist

## 2024-09-30 NOTE — Progress Notes (Signed)
 Pharmacy Quality Measure Review  This patient is appearing on a report for being at risk of failing the adherence measure for cholesterol (statin) medications this calendar year.   Medication: rosuvastatin  Last fill date: 08/28/2024 for 15 day supply  Reviewed recent refill history in Dr Annemarie database. Actual last refill date was 09/25/2024 for 30 day supply. Patient has no refills remaining.  Has appointment with cardiology who prescribed rosuvastatin  12/11. Next appointment with new PCP is 10/2024.   Insurance report was not up to date. No action needed at this time.   Madelin Ray, PharmD Clinical Pharmacist [facility]  Population Health [phone]

## 2024-10-01 NOTE — Progress Notes (Signed)
 Cardiology Office Note   Date:  10/02/2024   ID:  Mia Newman, DOB November 14, 1945, MRN [id number]  PCP:  Patient, No Pcp Per  Cardiologist:   Vina Gull, MD   Patient presents for follow up of HTN      History of Present Illness: Mia Newman is a 78 y.o. female with a history of chest pain (seen in ER in the past), atherosclerosisi on CT scan, HTN and HL  I saw her in clinic in Aug 2024  At at visit went to rosuvastatin  daily      Denies CP  Breathing is good  Complains of shoulder and knee pain    Has been seen by orthopedics remotely    DOing some walking     Ran 1 year ago   [employer]  Current Meds  Medication Sig   amLODipine  (NORVASC ) 2.5 MG tablet Take 1 tablet by mouth once daily   Ascorbic Acid (VITAMIN C) 1000 MG tablet Take 1,000 mg by mouth daily.   calcium  carbonate (OSCAL) 1500 (600 Ca) MG TABS tablet Take 600 mg of elemental calcium  by mouth 2 (two) times daily with a meal.   Cholecalciferol (VITAMIN D-3) 125 MCG (5000 UT) TABS Take by mouth.   famotidine  (PEPCID ) 20 MG tablet Take 1 tablet (20 mg total) by mouth 2 (two) times daily.   IBUPROFEN PO Take by mouth.   latanoprost (XALATAN) 0.005 % ophthalmic solution Place 1 drop into both eyes at bedtime.   levothyroxine  (SYNTHROID ) 50 MCG tablet Take 1 tablet (50 mcg total) by mouth daily.   rosuvastatin  (CRESTOR ) 5 MG tablet Take 1 tablet (5 mg total) by mouth daily.     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 She has never used smokeless tobacco. She reports that she does not drink alcohol and does not use drugs.   Family History:  The patient's family history includes Cancer in her maternal grandmother; Heart attack (age of onset: 77) in her father; Hypertension in her mother; Stomach cancer (age of onset: 19) in her paternal grandmother.    ROS:  Please see the history of present illness. All other systems are reviewed and  Negative to the above problem except as noted.    PHYSICAL EXAM: VS:  BP (!) 163/88 (BP Location: Left Arm, Patient Position: Sitting, Cuff Size: Normal)   Pulse 63   Ht 5' 2 (1.575 m)   Wt 137 lb (62.1 kg)   SpO2 93%   BMI  25.06 kg/m   GEN: Well nourished, well developed, in no acute distress  HEENT: normal  Neck: no JVD, no carotid bruits Cardiac: RRR; no murmurs Respiratory:  clear to auscultation  GI: soft, nontender   No hepatomegaly No masses  Ext  No LE edema  2+ PT pulsese   EKG:  EKG shows SR 63 bpm  LAD   LVH    Echo   JUly 2024  1. Left ventricular ejection fraction, by estimation, is 60 to 65%. The  left ventricle has normal function. The left ventricle has no regional  wall motion abnormalities. Left ventricular diastolic parameters are  consistent with Grade I diastolic  dysfunction (impaired relaxation).   2. Right ventricular systolic function is normal. The right ventricular  size is normal. There is normal pulmonary artery systolic pressure.   3. The mitral valve is normal in structure. Trivial mitral valve  regurgitation. No evidence of mitral stenosis.   4. The  aortic valve is tricuspid. Aortic valve regurgitation is not  visualized. No aortic stenosis is present.   5. The inferior vena cava is normal in size with greater than 50%  respiratory variability, suggesting right atrial pressure of 3 mmHg.   Lipid Panel    Component Value Date/Time   CHOL 277 (H) 02/06/2022 1414   TRIG 171.0 (H) 02/06/2022 1414   HDL 46.70 02/06/2022 1414   CHOLHDL 6 02/06/2022 1414   VLDL 34.2 02/06/2022 1414   LDLCALC 196 (H) 02/06/2022 1414   LDLCALC 213 (H) 12/23/2021 1552   LDLDIRECT 209.0 08/07/2018 1401      Wt Readings from Last 3 Encounters:  10/02/24 137 lb (62.1 kg)  04/10/24 134 lb (60.8 kg)  03/18/24 134 lb (60.8 kg)      ASSESSMENT AND PLAN:  1 HTN   BP is high today  She says she is in pain from shoulder / knee IT has been better at other visits She has a cuff at home    I have asked her to check BP regularly    Call if over 130s so we can work at adjusting    2 Hx of chest pressure   Pt has had in past   Denies now   3 Atherosclerosis   The pt has atherosclerosis of aorta on previous CT    4  Profound HL  LDL 190s in the past   On Crestor  5 mg daily now  WIll check NMR panel,today  5  Metabolics  Will check A1c  6 Joint pain  Encouraged her to call ortho again    Follow up 1 yeare    Current medicines are reviewed at length with the patient today.  The patient does not have concerns regarding medicines.  Signed, Vina Gull, MD

## 2024-10-02 ENCOUNTER — Ambulatory Visit: Payer: Medicare (Managed Care) | Attending: Internal Medicine | Admitting: Internal Medicine

## 2024-10-02 ENCOUNTER — Encounter: Payer: Self-pay | Admitting: Internal Medicine

## 2024-10-02 VITALS — BP 163/88 | HR 63 | Ht 62.0 in | Wt 137.0 lb

## 2024-10-02 DIAGNOSIS — I10 Essential (primary) hypertension: Secondary | ICD-10-CM | POA: Diagnosis not present

## 2024-10-02 NOTE — Patient Instructions (Addendum)
 Medication Instructions:  Your physician recommends that you continue on your current medications as directed. Please refer to the Current Medication list given to you today.  *If you need a refill on your cardiac medications before your next appointment, please call your pharmacy*  Lab Work: NMR Lipoprofile, A1C, CMET, Liporotein (a) If you have labs (blood work) drawn today and your tests are completely normal, you will receive your results only by: MyChart Message (if you have MyChart) OR A paper copy in the mail If you have any lab test that is abnormal or we need to change your treatment, we will call you to review the results.  Testing/Procedures: None ordered  Follow-Up: At [facility], you and your health needs are our priority.  As part of our continuing mission to provide you with exceptional heart care, our providers are all part of one team.  This team includes your primary Cardiologist (physician) and Advanced Practice Providers or APPs (Physician Assistants and Nurse Practitioners) who all work together to provide you with the care you need, when you need it.  Your next appointment:   1 year(s)  Provider:   Vina Gull, MD    We recommend signing up for the patient portal called MyChart.  Sign up information is provided on this After Visit Summary.  MyChart is used to connect with patients for Virtual Visits (Telemedicine).  Patients are able to view lab/test results, encounter notes, upcoming appointments, etc.  Non-urgent messages can be sent to your provider as well.   To learn more about what you can do with MyChart, go to [email or web address].

## 2024-11-04 ENCOUNTER — Other Ambulatory Visit: Payer: Self-pay | Admitting: Internal Medicine

## 2024-11-12 ENCOUNTER — Ambulatory Visit: Payer: Medicare (Managed Care) | Admitting: Family Medicine

## 2024-11-12 ENCOUNTER — Encounter: Payer: Self-pay | Admitting: Family Medicine

## 2024-11-12 VITALS — BP 137/58 | HR 64 | Ht 62.0 in | Wt 132.0 lb

## 2024-11-12 DIAGNOSIS — E039 Hypothyroidism, unspecified: Secondary | ICD-10-CM | POA: Diagnosis not present

## 2024-11-12 DIAGNOSIS — Z0000 Encounter for general adult medical examination without abnormal findings: Secondary | ICD-10-CM

## 2024-11-12 DIAGNOSIS — R7303 Prediabetes: Secondary | ICD-10-CM | POA: Diagnosis not present

## 2024-11-12 DIAGNOSIS — Z23 Encounter for immunization: Secondary | ICD-10-CM

## 2024-11-12 DIAGNOSIS — M25562 Pain in left knee: Secondary | ICD-10-CM

## 2024-11-12 DIAGNOSIS — L989 Disorder of the skin and subcutaneous tissue, unspecified: Secondary | ICD-10-CM | POA: Diagnosis not present

## 2024-11-12 DIAGNOSIS — E7800 Pure hypercholesterolemia, unspecified: Secondary | ICD-10-CM

## 2024-11-12 DIAGNOSIS — M25511 Pain in right shoulder: Secondary | ICD-10-CM

## 2024-11-12 DIAGNOSIS — G8929 Other chronic pain: Secondary | ICD-10-CM | POA: Diagnosis not present

## 2024-11-12 NOTE — Assessment & Plan Note (Signed)
 Persisting for a year, likely exacerbated by a fall. Symptoms include a sensation of clicking and rubbing. - Referred to sports medicine specialist for evaluation and cortisone injection. - Ordered x-ray of left knee

## 2024-11-12 NOTE — Progress Notes (Signed)
 "   New Patient Office Visit   Subjective     Patient ID: Mia Newman, female   DOB: [birth year]  Age: 79 y.o. MRN: [id number]   CC:  Chief Complaint  Patient presents with   Establish Care      HPI Mia Newman presents to establish care. She lives with her husband.    Discussed the use of AI scribe software for clinical note transcription with the patient, who gave verbal consent to proceed.  History of Present Illness Mia Newman is a 79 year old female who presents with chronic shoulder and knee pain following a fall.  She has been experiencing chronic pain in her right shoulder and left knee for approximately one year after tripping over a table leg and falling flat on the floor at a church event. Initially, there was no immediate pain, but soreness developed in her shoulder and knee about a week later. The knee feels like it 'clicks' and 'rubs against something.' The pain in both areas has been persistent, initially improving before worsening again. She has been using [facility] for relief and has previously seen a sports medicine doctor and received a cortisone shot for her shoulder in the past.  Her past medical history includes prediabetes, high cholesterol, hypothyroidism, and hypertension. She manages prediabetes with lifestyle measures and has never been on diabetes medication. She does not regularly check her blood sugar. She maintains a healthy diet and engages in regular physical activity, including walking and climbing steps.  She takes rosuvastatin  5 mg daily for high cholesterol, levothyroxine  50 mcg daily for hypothyroidism, and amlodipine  2.5 mg daily for hypertension. She does not regularly monitor her blood pressure at home.  She mentions a spot on her face that began as a blackhead and has since grown into a mole, which she has been monitoring.   Prediabetes: - Checking glucose at home: no - Medications: not currently managed with medication.  -  Compliance: n/a - Diet: general  - Exercise: walking - Eye exam: UTD - Foot exam:  - Microalbumin: - Denies symptoms of hypoglycemia, polyuria, polydipsia, numbness extremities, foot ulcers/trauma, wounds that are not healing, medication side effects  Lab Results  Component Value Date   HGBA1C 6.0 (H) 12/21/2023      Hyperlipidemia: - following with cardiology  - medications: Rosuvastatin  5 mg daily. - compliance: good - medication SEs: none The 10-year ASCVD risk score (Arnett DK, et al., 2019) is: 28.4%   Values used to calculate the score:     Age: 8 years     Clinically relevant sex: Female     Is Non-Hispanic African American: No     Diabetic: No     Tobacco smoker: No     Systolic Blood Pressure: 137 mmHg     Is BP treated: Yes     HDL Cholesterol: 46.7 mg/dL     Total Cholesterol: 203 mg/dL   Hypothyroidism: - Management: Levothyroxine  50 mcg daily. -Taking medications as prescribed in the morning, apart from other foods, meds, vitamins, etc.  -No recent changes to hair, skin, nails, energy levels Lab Results  Component Value Date   TSH 2.14 03/18/2024     Hypertension: - following with cardiology  - Medications: amlodipine  2.5 mg daily - Compliance: good - Checking BP at home: rarely - Denies any SOB, recurrent headaches, CP, vision changes, LE edema, dizziness, palpitations, or medication side effects.       Show/hide medication list[1] Past Medical  History:  Diagnosis Date   Allergy    Arthritis    ASYMPTOMATIC POSTMENOPAUSAL STATUS 03/03/2010   DIVERTICULITIS-COLON 05/22/2008   DIVERTICULOSIS, COLON 05/20/2008   DM 04/22/2008   borderline   Glaucoma    Hyperthyroidism    Irritable bowel syndrome    Personal history of colonic polyp - adenoma 03/03/2014   Pure hypercholesterolemia 09/27/2007   Unspecified hypothyroidism 04/22/2008    Past Surgical History:  Procedure Laterality Date   ABDOMINAL HYSTERECTOMY  1987 apx   BRAIN MENINGIOMA  EXCISION     removed from pituitary gland ([facility])   BREAST BIOPSY Left 03/08/2023   MM LT BREAST BX W LOC DEV 1ST LESION IMAGE BX SPEC STEREO GUIDE 03/08/2023 [facility]   BREAST SURGERY     COLONOSCOPY     TUBAL LIGATION     VSD REPAIR  1996     Family History  Problem Relation Age of Onset   Hypertension Mother    Heart attack Father 24   Cancer Maternal Grandmother        Uterine Cancer   Stomach cancer Paternal Grandmother 39   Colon cancer Neg Hx    Esophageal cancer Neg Hx     Social History   Socioeconomic History   Marital status: Married    Spouse name: Not on file   Number of children: Not on file   Years of education: Not on file   Highest education level: Not on file  Occupational History   Occupation: Retired  Tobacco Use   Smoking status: Never   Smokeless tobacco: Never  Vaping Use   Vaping status: Never Used  Substance and Sexual Activity   Alcohol use: No   Drug use: No   Sexual activity: Yes    Birth control/protection: None  Other Topics Concern   Not on file  Social History Narrative   Not on file   Social Drivers of Health   Tobacco Use: Low Risk (11/12/2024)   Patient History    Smoking Tobacco Use: Never    Smokeless Tobacco Use: Never    Passive Exposure: Not on file  Financial Resource Strain: Low Risk (03/18/2024)   Overall Financial Resource Strain (CARDIA)    Difficulty of Paying Living Expenses: Not hard at all  Food Insecurity: No Food Insecurity (03/18/2024)   Hunger Vital Sign    Worried About Running Out of Food in the Last Year: Never true    Ran Out of Food in the Last Year: Never true  Transportation Needs: No Transportation Needs (03/18/2024)   PRAPARE - Administrator, Civil Service (Medical): No    Lack of Transportation (Non-Medical): No  Physical Activity: Inactive (03/18/2024)   Exercise Vital Sign    Days of Exercise per Week: 0 days    Minutes of Exercise per Session: 0 min  Stress: No Stress  Concern Present (03/18/2024)   [employer] of Occupational Health - Occupational Stress Questionnaire    Feeling of Stress : Not at all  Social Connections: Socially Integrated (03/18/2024)   Social Connection and Isolation Panel    Frequency of Communication with Friends and Family: More than three times a week    Frequency of Social Gatherings with Friends and Family: More than three times a week    Attends Religious Services: More than 4 times per year    Active Member of [employer] or Organizations: Yes    Attends Banker Meetings: More than 4 times per year  Marital Status: Married  Depression (PHQ2-9): Low Risk (11/12/2024)   Depression (PHQ2-9)    PHQ-2 Score: 0  Alcohol Screen: Low Risk (03/18/2024)   Alcohol Screen    Last Alcohol Screening Score (AUDIT): 0  Housing: Unknown (05/26/2024)   Received [facility]   Epic    Unable to Pay for Housing in the Last Year: Not on file    Number of Times Moved in the Last Year: Not on file    At any time in the past 12 months, were you homeless or living in a shelter (including now)?: No  Utilities: Not At Risk (03/18/2024)   [facility] Utilities    Threatened with loss of utilities: No  Health Literacy: Adequate Health Literacy (03/18/2024)   B1300 Health Literacy    Frequency of need for help with medical instructions: Never       ROS All review of systems negative except what is listed in the HPI    Objective     BP (!) 137/58   Pulse 64   Ht 5' 2 (1.575 m)   Wt 132 lb (59.9 kg)   SpO2 99%   BMI 24.14 kg/m   Physical Exam Vitals reviewed.  Constitutional:      Appearance: Normal appearance.  Cardiovascular:     Rate and Rhythm: Normal rate and regular rhythm.     Heart sounds: Normal heart sounds.  Pulmonary:     Effort: Pulmonary effort is normal.     Breath sounds: Normal breath sounds.  Skin:    General: Skin is warm and dry.  Neurological:     Mental Status: She is alert and  oriented to person, place, and time.  Psychiatric:        Mood and Affect: Mood normal.        Behavior: Behavior normal.        Thought Content: Thought content normal.        Judgment: Judgment normal.    ~1.5 cm raised dark lesion to left cheek (present for the past 6 years, states it started as a blackhead)      Assessment & Plan:     Problem List Items Addressed This Visit       Active Problems   Pure hypercholesterolemia   Medication management: rosuvastatin  5 mg daily Lifestyle factors for lowering cholesterol include: Diet therapy - heart-healthy diet rich in fruits, veggies, fiber-rich whole grains, lean meats, chicken, fish (at least twice a week), fat-free or 1% dairy products; foods low in saturated/trans fats, cholesterol, sodium, and sugar. Mediterranean diet has shown to be very heart healthy. Regular exercise - recommend at least 30 minutes a day, 5 times per week Weight management  Labs today - cardiology ordered labs at last visit but she did not get, will obtain today.       Relevant Orders   NMR, lipoprofile   Lipoprotein A (LPA)   Chronic right shoulder pain   Persisting for over a year, likely exacerbated by a fall. Previous cortisone injection provided temporary relief. - Referred to sports medicine specialist for evaluation and possible injection. - Ordered x-ray of right shoulder      Relevant Orders   DG Shoulder Right   Chronic pain of left knee   Persisting for a year, likely exacerbated by a fall. Symptoms include a sensation of clicking and rubbing. - Referred to sports medicine specialist for evaluation and cortisone injection. - Ordered x-ray of left knee  Relevant Orders   DG Knee 1-2 Views Left   Hypothyroidism   Previously well controlled Continue Synthroid  at current dose  Recheck TSH and adjust Synthroid  as indicated       Relevant Orders   TSH   Pre-diabetes - Primary   Well-managed with lifestyle measures. Blood  sugar levels stable. - Ordered blood work to monitor A1c levels.      Relevant Orders   Comprehensive metabolic panel with GFR   Hemoglobin A1c   Other Visit Diagnoses       Encounter for medical examination to establish care          Immunization due       Relevant Orders   Flu vaccine HIGH DOSE PF(Fluzone Trivalent) (Completed)     Skin lesion of cheek     Facial skin lesion has grown over time.  - Referred to dermatologist for evaluation of facial skin lesion.   Relevant Orders   Ambulatory referral to Dermatology           Return in about 6 months (around 05/12/2025) for physical.  Waddell KATHEE Mon, NP  I,Emily Lagle,acting as a scribe for Waddell KATHEE Mon, NP.,have documented all relevant documentation on the behalf of Waddell KATHEE Mon, NP.  I, Waddell KATHEE Mon, NP, have reviewed all documentation for this visit. The documentation on 11/12/2024 for the exam, diagnosis, procedures, and orders are all accurate and complete.      [1]  Outpatient Medications Prior to Visit  Medication Sig   amLODipine  (NORVASC ) 2.5 MG tablet Take 1 tablet (2.5 mg total) by mouth daily.   Ascorbic Acid (VITAMIN C) 1000 MG tablet Take 1,000 mg by mouth daily.   calcium  carbonate (OSCAL) 1500 (600 Ca) MG TABS tablet Take 600 mg of elemental calcium  by mouth 2 (two) times daily with a meal.   Cholecalciferol (VITAMIN D-3) 125 MCG (5000 UT) TABS Take by mouth.   famotidine  (PEPCID ) 20 MG tablet Take 1 tablet (20 mg total) by mouth 2 (two) times daily.   IBUPROFEN PO Take by mouth.   latanoprost (XALATAN) 0.005 % ophthalmic solution Place 1 drop into both eyes at bedtime.   levothyroxine  (SYNTHROID ) 50 MCG tablet Take 1 tablet (50 mcg total) by mouth daily.   rosuvastatin  (CRESTOR ) 5 MG tablet Take 1 tablet by mouth once daily   triamcinolone  cream (KENALOG ) 0.1 % Apply 1 Application topically 2 (two) times daily.   [DISCONTINUED] valACYclovir  (VALTREX ) 500 MG tablet Take 1 tablet (500 mg total) by  mouth 2 (two) times daily.   No facility-administered medications prior to visit.   "

## 2024-11-12 NOTE — Assessment & Plan Note (Signed)
 Previously well controlled Continue Synthroid at current dose  Recheck TSH and adjust Synthroid as indicated

## 2024-11-12 NOTE — Assessment & Plan Note (Signed)
 Medication management: rosuvastatin  5 mg daily Lifestyle factors for lowering cholesterol include: Diet therapy - heart-healthy diet rich in fruits, veggies, fiber-rich whole grains, lean meats, chicken, fish (at least twice a week), fat-free or 1% dairy products; foods low in saturated/trans fats, cholesterol, sodium, and sugar. Mediterranean diet has shown to be very heart healthy. Regular exercise - recommend at least 30 minutes a day, 5 times per week Weight management  Labs today - cardiology ordered labs at last visit but she did not get, will obtain today.

## 2024-11-12 NOTE — Assessment & Plan Note (Signed)
 Well-managed with lifestyle measures. Blood sugar levels stable. - Ordered blood work to monitor A1c levels.

## 2024-11-12 NOTE — Assessment & Plan Note (Signed)
 Persisting for over a year, likely exacerbated by a fall. Previous cortisone injection provided temporary relief. - Referred to sports medicine specialist for evaluation and possible injection. - Ordered x-ray of right shoulder

## 2024-11-13 LAB — NMR, LIPOPROFILE
Cholesterol, Total: 215 mg/dL — ABNORMAL HIGH (ref 100–199)
HDL Particle Number: 31.2 umol/L
HDL-C: 44 mg/dL
LDL Particle Number: 1766 nmol/L — ABNORMAL HIGH
LDL Size: 20.9 nm
LDL-C (NIH Calc): 136 mg/dL — ABNORMAL HIGH (ref 0–99)
LP-IR Score: 77 — ABNORMAL HIGH
Small LDL Particle Number: 958 nmol/L — ABNORMAL HIGH
Triglycerides: 196 mg/dL — ABNORMAL HIGH (ref 0–149)

## 2024-11-13 LAB — COMPREHENSIVE METABOLIC PANEL WITH GFR
ALT: 13 U/L (ref 3–35)
AST: 15 U/L (ref 5–37)
Albumin: 4.3 g/dL (ref 3.5–5.2)
Alkaline Phosphatase: 47 U/L (ref 39–117)
BUN: 9 mg/dL (ref 6–23)
CO2: 31 meq/L (ref 19–32)
Calcium: 9.8 mg/dL (ref 8.4–10.5)
Chloride: 103 meq/L (ref 96–112)
Creatinine, Ser: 0.85 mg/dL (ref 0.40–1.20)
GFR: 65.86 mL/min
Glucose, Bld: 87 mg/dL (ref 70–99)
Potassium: 4.3 meq/L (ref 3.5–5.1)
Sodium: 142 meq/L (ref 135–145)
Total Bilirubin: 0.5 mg/dL (ref 0.2–1.2)
Total Protein: 6.7 g/dL (ref 6.0–8.3)

## 2024-11-13 LAB — HEMOGLOBIN A1C: Hgb A1c MFr Bld: 6 % (ref 4.6–6.5)

## 2024-11-13 LAB — TSH: TSH: 6.69 u[IU]/mL — ABNORMAL HIGH (ref 0.35–5.50)

## 2024-11-14 ENCOUNTER — Ambulatory Visit: Payer: Self-pay | Admitting: Family Medicine

## 2024-11-14 DIAGNOSIS — E7800 Pure hypercholesterolemia, unspecified: Secondary | ICD-10-CM

## 2024-11-14 DIAGNOSIS — E039 Hypothyroidism, unspecified: Secondary | ICD-10-CM

## 2024-11-14 DIAGNOSIS — R9389 Abnormal findings on diagnostic imaging of other specified body structures: Secondary | ICD-10-CM

## 2024-11-14 MED ORDER — LEVOTHYROXINE SODIUM 75 MCG PO TABS: 75.0000 ug | ORAL_TABLET | Freq: Every day | ORAL | 1 refills | Status: AC

## 2024-11-16 LAB — LIPOPROTEIN A (LPA): Lipoprotein (a): 153 nmol/L — ABNORMAL HIGH

## 2024-11-19 NOTE — Progress Notes (Unsigned)
 "               Odis Mace D.[facility] [address] [place] [address] Phone: [phone]   Assessment and Plan:     1. Chronic right shoulder pain (Primary) 2. Impingement of right shoulder -Chronic with exacerbation, subsequent visit - Recurrence of right shoulder pain most consistent with impingement syndrome based on HPI, physical exam, x-ray imaging - X-ray obtained at today's visit.  My interpretation: No acute fracture or dislocation.  Mild degenerative changes in glenohumeral and AC joint - Patient has received significant relief with subacromial CSI in the past.  Elected for repeat subacromial CSI today.  Tolerated well per note below - Restart HEP for shoulder  Procedure: Subacromial Injection Side: Right  Risks explained and consent was given verbally. The site was cleaned with alcohol prep. A steroid injection was performed from posterior approach using 2mL of 1% lidocaine without epinephrine and 1mL of kenalog  40mg /ml. This was well tolerated.  Needle was removed, hemostasis achieved, and post injection instructions were explained.   Pt was advised to call or return to clinic if these symptoms worsen or fail to improve as anticipated.   3. Chronic pain of left knee -Chronic with exacerbation, initial sports medicine visit - Knee pain x 5 to 6 months after fall.  Most consistent with pain from patellofemoral compartment after contusion with mild degenerative changes, patellar spurring - Patient elected for intra-articular knee CSI.  Tolerated well per note below - Start HEP for knee - X-ray obtained in clinic.  My interpretation: No acute fracture or dislocation.  Mild degenerative changes.  Superior patellar enthesophyte  Procedure: Knee Joint Injection Side: Left Indication: Patellofemoral pain  Risks explained and consent was given verbally. The site was cleaned with alcohol prep. A needle was introduced with an anterio-lateral approach.  Injection given using 2mL of 1% lidocaine without epinephrine and 1mL of kenalog  40mg /ml. This was well tolerated.  Needle was removed, hemostasis achieved, and post injection instructions were explained.   Pt was advised to call or return to clinic if these symptoms worsen or fail to improve as anticipated.    15 additional minutes spent for educating Therapeutic Home Exercise Program.  This included exercises focusing on stretching, strengthening, with focus on eccentric aspects.   Long term goals include an improvement in range of motion, strength, endurance as well as avoiding reinjury. Patient's frequency would include in 1-2 times a day, 3-5 times a week for a duration of 6-12 weeks. Proper technique shown and discussed handout in great detail with ATC.  All questions were discussed and answered.    Pertinent previous records reviewed include primary care note 11/12/2024   Follow Up: 4 to 6 weeks for reevaluation.  Could consider advanced imaging versus physical therapy versus NSAID course if no improvement or worsening of symptoms   Subjective:   I, Lynze Reddy, am serving as a neurosurgeon for Doctor Morene Mace  Chief Complaint: left knee and right shoulder pain   HPI:  08/30/22 Patient is a 79 year old female complaining of right shoulder pain. Patient states that it started hurting a couple of months ago , only hurts when she moves it but other than that not so bad , no numbness or tingling , decreased ROM due to pain, no MOI , hx of the same thing in the left shoulder , does yard work and rakes some, did fall in the living room with her hands hitting  the floor but wasn't in to much pain after that a couple months ago was the fall, ibuprofen for the pain and that seems to help .   09/18/2022 Patient states that she is doing good , no more pain    11/10/2022 Patient states that she has a crunchy feeling right now and a little pain is back    11/30/2022 Patient states that her  shoulder is very good     11/20/2024 Patient is a 79 year old female with left knee and right shoulder pain. Patient states she fell 5-6 months ago.  Left knee pain thinks she might of hurt that when she fell. Ibu for the pain and that helps. No radiating pain. No numbness or tingling. ROM WNL  Relevant Historical Information: Hypothyroidism    Additional pertinent review of systems negative.  Current Medications[1]   Objective:     Vitals:   11/20/24 1408  BP: 130/68  Pulse: (!) 50  SpO2: 92%  Weight: 135 lb (61.2 kg)  Height: 5' 2 (1.575 m)      Body mass index is 24.69 kg/m.    Physical Exam:    General:  awake, alert oriented, no acute distress nontoxic Skin: no suspicious lesions or rashes Neuro:sensation intact and strength 5/5 with no deficits, no atrophy, normal muscle tone Psych: No signs of anxiety, depression or other mood disorder  Left knee: No swelling No deformity Neg fluid wave, joint milking ROM Flex 110, Ext 0 TTP patella NTTP over the quad tendon, medial fem condyle, lat fem condyle,  , patella tendon, tibial tuberostiy, fibular head, posterior fossa, pes anserine bursa, gerdy's tubercle, medial jt line, lateral jt line Neg anterior and posterior drawer Neg lachman Negative varus stress Negative valgus stress Negative McMurray Positive Thessaly  Gait normal   Right shoulder: no deformity, swelling or muscle wasting No scapular winging FF 160 with painful arc, abd 160 with painful arc, int 0, ext 90 NTTP over the [place], clavicle, ac, coracoid, biceps groove, humerus, deltoid, trapezius, cervical spine Positive Hawking's, empty can, O'Brien Neg neer,  subscap liftoff, speeds,  , crossarm Neg ant drawer, sulcus sign, apprehension Negative Spurling's test bilat FROM of neck    Electronically signed by:  Odis Mace D.[facility] 2:35 PM 11/20/24     [1]  Current Outpatient Medications:    amLODipine  (NORVASC ) 2.5  MG tablet, Take 1 tablet (2.5 mg total) by mouth daily., Disp: 90 tablet, Rfl: 3   Ascorbic Acid (VITAMIN C) 1000 MG tablet, Take 1,000 mg by mouth daily., Disp: , Rfl:    calcium  carbonate (OSCAL) 1500 (600 Ca) MG TABS tablet, Take 600 mg of elemental calcium  by mouth 2 (two) times daily with a meal., Disp: , Rfl:    Cholecalciferol (VITAMIN D-3) 125 MCG (5000 UT) TABS, Take by mouth., Disp: , Rfl:    famotidine  (PEPCID ) 20 MG tablet, Take 1 tablet (20 mg total) by mouth 2 (two) times daily., Disp: , Rfl:    IBUPROFEN PO, Take by mouth., Disp: , Rfl:    latanoprost (XALATAN) 0.005 % ophthalmic solution, Place 1 drop into both eyes at bedtime., Disp: , Rfl:    levothyroxine  (SYNTHROID ) 75 MCG tablet, Take 1 tablet (75 mcg total) by mouth daily., Disp: 90 tablet, Rfl: 1   rosuvastatin  (CRESTOR ) 5 MG tablet, Take 1 tablet by mouth once daily, Disp: 30 tablet, Rfl: 0   triamcinolone  cream (KENALOG ) 0.1 %, Apply 1 Application topically 2 (two) times daily.,  Disp: 80 g, Rfl: 1  "

## 2024-11-20 ENCOUNTER — Ambulatory Visit

## 2024-11-20 ENCOUNTER — Ambulatory Visit: Payer: Self-pay | Admitting: Sports Medicine

## 2024-11-20 VITALS — BP 130/68 | HR 50 | Ht 62.0 in | Wt 135.0 lb

## 2024-11-20 DIAGNOSIS — M25562 Pain in left knee: Secondary | ICD-10-CM

## 2024-11-20 DIAGNOSIS — G8929 Other chronic pain: Secondary | ICD-10-CM

## 2024-11-20 DIAGNOSIS — M25511 Pain in right shoulder: Secondary | ICD-10-CM | POA: Diagnosis not present

## 2024-11-20 DIAGNOSIS — M25811 Other specified joint disorders, right shoulder: Secondary | ICD-10-CM

## 2024-11-20 NOTE — Patient Instructions (Signed)
 Knee HEP  4-6 week follow up

## 2024-11-22 MED ORDER — ROSUVASTATIN CALCIUM 20 MG PO TABS: 20.0000 mg | ORAL_TABLET | Freq: Every day | ORAL | 1 refills | Status: AC

## 2024-11-22 NOTE — Telephone Encounter (Signed)
 Sending in new Crestor  per cardiologist's recommendations and ordering future labs to evaluate - fasting in 8-12 weeks. Please update patient.

## 2024-12-19 ENCOUNTER — Ambulatory Visit: Admitting: Sports Medicine

## 2025-01-06 ENCOUNTER — Other Ambulatory Visit

## 2025-01-15 ENCOUNTER — Ambulatory Visit: Admitting: Dermatology

## 2025-03-24 ENCOUNTER — Ambulatory Visit: Payer: Medicare (Managed Care)
